# Patient Record
Sex: Male | Born: 1940 | Race: White | Hispanic: No | Marital: Married | Smoking: Former smoker
Health system: Southern US, Community
[De-identification: ages and names within clinical notes are randomized; demographics above are authoritative.]

## PROBLEM LIST (undated history)

## (undated) DIAGNOSIS — N486 Induration penis plastica: Secondary | ICD-10-CM

## (undated) DIAGNOSIS — N529 Male erectile dysfunction, unspecified: Secondary | ICD-10-CM

## (undated) DIAGNOSIS — N201 Calculus of ureter: Secondary | ICD-10-CM

## (undated) DIAGNOSIS — E039 Hypothyroidism, unspecified: Secondary | ICD-10-CM

## (undated) DIAGNOSIS — R911 Solitary pulmonary nodule: Secondary | ICD-10-CM

## (undated) DIAGNOSIS — E785 Hyperlipidemia, unspecified: Secondary | ICD-10-CM

## (undated) DIAGNOSIS — I2510 Atherosclerotic heart disease of native coronary artery without angina pectoris: Secondary | ICD-10-CM

## (undated) DIAGNOSIS — T7840XA Allergy, unspecified, initial encounter: Secondary | ICD-10-CM

## (undated) DIAGNOSIS — I219 Acute myocardial infarction, unspecified: Secondary | ICD-10-CM

## (undated) DIAGNOSIS — R319 Hematuria, unspecified: Secondary | ICD-10-CM

## (undated) DIAGNOSIS — M7660 Achilles tendinitis, unspecified leg: Secondary | ICD-10-CM

## (undated) DIAGNOSIS — I10 Essential (primary) hypertension: Secondary | ICD-10-CM

## (undated) DIAGNOSIS — R222 Localized swelling, mass and lump, trunk: Secondary | ICD-10-CM

## (undated) DIAGNOSIS — M6528 Calcific tendinitis, other site: Secondary | ICD-10-CM

## (undated) DIAGNOSIS — G479 Sleep disorder, unspecified: Secondary | ICD-10-CM

## (undated) DIAGNOSIS — Z87442 Personal history of urinary calculi: Secondary | ICD-10-CM

## (undated) HISTORY — DX: Acute myocardial infarction, unspecified: I21.9

## (undated) HISTORY — DX: Calculus of ureter: N20.1

## (undated) HISTORY — PX: OSTECTOMY CALCANEUS: SUR969

## (undated) HISTORY — DX: Localized swelling, mass and lump, trunk: R22.2

## (undated) HISTORY — DX: Hematuria, unspecified: R31.9

## (undated) HISTORY — DX: Essential (primary) hypertension: I10

## (undated) HISTORY — DX: Hyperlipidemia, unspecified: E78.5

## (undated) HISTORY — DX: Male erectile dysfunction, unspecified: N52.9

## (undated) HISTORY — PX: CORONARY ARTERY BYPASS GRAFT: SHX141

## (undated) HISTORY — DX: Allergy, unspecified, initial encounter: T78.40XA

## (undated) HISTORY — DX: Atherosclerotic heart disease of native coronary artery without angina pectoris: I25.10

## (undated) HISTORY — DX: Solitary pulmonary nodule: R91.1

## (undated) HISTORY — DX: Hypothyroidism, unspecified: E03.9

## (undated) HISTORY — DX: Personal history of urinary calculi: Z87.442

## (undated) HISTORY — DX: Induration penis plastica: N48.6

## (undated) HISTORY — PX: PENILE PROSTHESIS IMPLANT: SHX240

## (undated) HISTORY — PX: COLONOSCOPY: SHX174

---
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
## 1998-06-03 ENCOUNTER — Ambulatory Visit (HOSPITAL_COMMUNITY): Admission: RE | Admit: 1998-06-03 | Discharge: 1998-06-03 | Payer: Self-pay | Admitting: Internal Medicine

## 1998-06-03 ENCOUNTER — Encounter: Payer: Self-pay | Admitting: Internal Medicine

## 1998-07-04 ENCOUNTER — Ambulatory Visit (HOSPITAL_COMMUNITY): Admission: RE | Admit: 1998-07-04 | Discharge: 1998-07-04 | Payer: Self-pay | Admitting: Gastroenterology

## 1998-10-17 ENCOUNTER — Ambulatory Visit (HOSPITAL_COMMUNITY): Admission: RE | Admit: 1998-10-17 | Discharge: 1998-10-17 | Payer: Self-pay | Admitting: Gastroenterology

## 1999-06-08 HISTORY — PX: CARDIAC CATHETERIZATION: SHX172

## 2000-10-27 ENCOUNTER — Encounter: Payer: Self-pay | Admitting: General Surgery

## 2000-10-28 ENCOUNTER — Ambulatory Visit (HOSPITAL_COMMUNITY): Admission: RE | Admit: 2000-10-28 | Discharge: 2000-10-28 | Payer: Self-pay | Admitting: General Surgery

## 2004-06-07 HISTORY — PX: CORONARY ARTERY BYPASS GRAFT: SHX141

## 2010-06-07 DIAGNOSIS — Z87442 Personal history of urinary calculi: Secondary | ICD-10-CM

## 2010-06-07 DIAGNOSIS — R911 Solitary pulmonary nodule: Secondary | ICD-10-CM

## 2010-06-07 HISTORY — DX: Solitary pulmonary nodule: R91.1

## 2010-06-07 HISTORY — DX: Personal history of urinary calculi: Z87.442

## 2011-05-27 ENCOUNTER — Ambulatory Visit: Payer: Self-pay | Admitting: Internal Medicine

## 2011-06-09 LAB — HM DIABETES EYE EXAM

## 2011-07-08 ENCOUNTER — Encounter: Payer: Self-pay | Admitting: Internal Medicine

## 2011-07-08 ENCOUNTER — Ambulatory Visit (INDEPENDENT_AMBULATORY_CARE_PROVIDER_SITE_OTHER): Payer: Medicare Other | Admitting: Internal Medicine

## 2011-07-08 VITALS — BP 154/84 | HR 64 | Temp 98.3°F | Ht 70.0 in | Wt 188.0 lb

## 2011-07-08 DIAGNOSIS — E785 Hyperlipidemia, unspecified: Secondary | ICD-10-CM

## 2011-07-08 DIAGNOSIS — Z125 Encounter for screening for malignant neoplasm of prostate: Secondary | ICD-10-CM

## 2011-07-08 DIAGNOSIS — E039 Hypothyroidism, unspecified: Secondary | ICD-10-CM

## 2011-07-08 DIAGNOSIS — I2510 Atherosclerotic heart disease of native coronary artery without angina pectoris: Secondary | ICD-10-CM

## 2011-07-08 DIAGNOSIS — I10 Essential (primary) hypertension: Secondary | ICD-10-CM | POA: Diagnosis not present

## 2011-07-08 DIAGNOSIS — M6790 Unspecified disorder of synovium and tendon, unspecified site: Secondary | ICD-10-CM

## 2011-07-08 DIAGNOSIS — H579 Unspecified disorder of eye and adnexa: Secondary | ICD-10-CM

## 2011-07-08 DIAGNOSIS — E1139 Type 2 diabetes mellitus with other diabetic ophthalmic complication: Secondary | ICD-10-CM | POA: Diagnosis not present

## 2011-07-08 DIAGNOSIS — E113599 Type 2 diabetes mellitus with proliferative diabetic retinopathy without macular edema, unspecified eye: Secondary | ICD-10-CM

## 2011-07-08 NOTE — Patient Instructions (Signed)
We are changing cholesterol medication to Crestor 20 mg daily.   We will repeat lipids in 6 weeks time.  Check on your formulary for atorvastatin (Lipitor) and rosuvastatin (Crestor)  So we can determine which one to switch you to long term

## 2011-07-08 NOTE — Progress Notes (Signed)
Subjective:    Patient ID: Brett [name], male    DOB: [birth year], 71 y.o.   MRN: [id number]  HPI  [name] is a 71 year old white male with a history of coronary artery disease status post four-vessel bypass in 2006, diabetes mellitus with proliferative retinopathy, depression, and hypothyroidism who presents to establish primary care.  He has chronic bilateral heel pain secondary to bone spurs on both Achilles tendons which prevent him from exercising regularly. He has undergone steroid injections by his podiatrist which helped transiently. His last primary care physician was several months ago and he brings labs from his October 2012 evaluation. And C6 0.9 his last diabetic eye exam was at the [facility] last month. He has no history of neuropathy. He is up to date on colon cancer screening as his last colonoscopy was done in 2009 with some polyps found. He is interested in transferring GI followup to [facility]. He has had a PSA within the last year by a screening fair which was apparently normal. He has no symptoms of enlarged prostate. He has no other joint pains other than his Achilles tendinopathy. He did have a flu vaccine this year and had a Pneumovax at the [place] as well as the shingles vaccine at the [place] within the past year. He notes that after his CABG he had complete resolution of chronic allergic rhinitis. However after review of his medications he has been taking Benadryl every night for insomnia.   Past Medical History  Diagnosis Date  . Hypertension   . Hyperlipidemia   . Diabetes mellitus   . Myocardial infarction   . Allergy     resolved after CABG   No current outpatient prescriptions on file prior to visit.    Review of Systems  Constitutional: Negative for fever, chills, diaphoresis, activity change, appetite change, fatigue and unexpected weight change.  HENT: Negative for hearing loss, ear pain, nosebleeds, congestion, sore throat, facial swelling, rhinorrhea, sneezing,  drooling, mouth sores, trouble swallowing, neck pain, neck stiffness, dental problem, voice change, postnasal drip, sinus pressure, tinnitus and ear discharge.   Eyes: Negative for photophobia, pain, discharge, redness, itching and visual disturbance.  Respiratory: Negative for apnea, cough, choking, chest tightness, shortness of breath, wheezing and stridor.   Cardiovascular: Negative for chest pain, palpitations and leg swelling.  Gastrointestinal: Negative for nausea, vomiting, abdominal pain, diarrhea, constipation, blood in stool, abdominal distention, anal bleeding and rectal pain.  Genitourinary: Negative for dysuria, urgency, frequency, hematuria, flank pain, decreased urine volume, scrotal swelling, difficulty urinating and testicular pain.  Musculoskeletal: Positive for gait problem. Negative for myalgias, back pain, joint swelling and arthralgias.  Skin: Negative for color change, rash and wound.  Neurological: Negative for dizziness, tremors, seizures, syncope, speech difficulty, weakness, light-headedness, numbness and headaches.  Psychiatric/Behavioral: Negative for suicidal ideas, hallucinations, behavioral problems, confusion, sleep disturbance, dysphoric mood, decreased concentration and agitation. The patient is not nervous/anxious.    BP 154/84  Pulse 64  Temp(Src) 98.3 F (36.8 C) (Oral)  Ht 5\' 10"  (1.778 m)  Wt 188 lb (85.276 kg)  BMI 26.98 kg/m2  SpO2 98%     Objective:   Physical Exam  Constitutional: He is oriented to person, place, and time.  HENT:  Head: Normocephalic and atraumatic.  Mouth/Throat: Oropharynx is clear and moist.  Eyes: Conjunctivae and EOM are normal.  Neck: Normal range of motion. Neck supple. No JVD present. No thyromegaly present.  Cardiovascular: Normal rate, regular rhythm and normal heart sounds.   Pulmonary/Chest: Effort normal  and breath sounds normal. He has no wheezes. He has no rales.  Abdominal: Soft. Bowel sounds are normal. He  exhibits no mass. There is no tenderness. There is no rebound.  Musculoskeletal: Normal range of motion. He exhibits no edema.  Neurological: He is alert and oriented to person, place, and time.  Skin: Skin is warm and dry.  Psychiatric: He has a normal mood and affect.      Assessment & Plan:   Diabetes mellitus with proliferative retinopathy His diabetes is well-controlled by prior labs. He has close followup on his retinopathy with the [facility] ophthalmologist. Repeat A1c is due.  Multiple vessel coronary artery disease He is status post four-vessel bypass in 2006 and has been chest pain-free for over one year. He is on a beta blocker and ACE inhibitor a statin and a baby aspirin his fasting lipids will be checked today and medications will be adjusted for goal LDL of less than 70.   Lower extremity tendinopathy He has bilateral Achilles tendon pain due to bone spurs which have caused tendinopathy. This limits his ability to exercise to short walks and swimming. He is not interested in additional podiatry evaluation at this time.  Unspecified hypothyroidism His TSH was within normal limits in October on current levothyroxine dose. No changes today.  Hypertension: He has been asked to have his blood pressure rechecked several times prior to his next visit in 3 months. At that time adjustments will need to his blood pressure medications if his home readings are high as well.  Updated Medication List Outpatient Encounter Prescriptions as of 07/08/2011  Medication Sig Dispense Refill  . Calcium Carbonate-Vitamin D (CALCIUM 600+D PO) Take one by mouth daily      . diphenhydrAMINE (SOMINEX) 25 MG tablet Take 25 mg by mouth at bedtime as needed.      Marland Kitchen ibuprofen (ADVIL,MOTRIN) 800 MG tablet Take one by mouth daily      . levothyroxine (SYNTHROID, LEVOTHROID) 112 MCG tablet Take 112 mcg by mouth daily.      Marland Kitchen lisinopril (PRINIVIL,ZESTRIL) 2.5 MG tablet Take 2.5 mg by mouth daily.      .  metFORMIN (GLUCOPHAGE) 850 MG tablet Take 850 mg by mouth 2 (two) times daily with a meal.      . metoprolol tartrate (LOPRESSOR) 25 MG tablet Take 25 mg by mouth daily.      . Omega-3 Fatty Acids (FISH OIL) 1000 MG CAPS Take one by mouth daily      . simvastatin (ZOCOR) 40 MG tablet Take 40 mg by mouth every evening.      . venlafaxine (EFFEXOR) 75 MG tablet Take 75 mg by mouth daily.

## 2011-07-09 DIAGNOSIS — E119 Type 2 diabetes mellitus without complications: Secondary | ICD-10-CM | POA: Insufficient documentation

## 2011-07-09 DIAGNOSIS — I152 Hypertension secondary to endocrine disorders: Secondary | ICD-10-CM | POA: Insufficient documentation

## 2011-07-09 DIAGNOSIS — E039 Hypothyroidism, unspecified: Secondary | ICD-10-CM | POA: Insufficient documentation

## 2011-07-09 DIAGNOSIS — E11319 Type 2 diabetes mellitus with unspecified diabetic retinopathy without macular edema: Secondary | ICD-10-CM | POA: Insufficient documentation

## 2011-07-09 DIAGNOSIS — M65279 Calcific tendinitis, unspecified ankle and foot: Secondary | ICD-10-CM | POA: Insufficient documentation

## 2011-07-09 NOTE — Assessment & Plan Note (Signed)
He has bilateral Achilles tendon pain due to bone spurs which have caused tendinopathy. This limits his ability to exercise to short walks and swimming. He is not interested in additional podiatry evaluation at this time.

## 2011-07-09 NOTE — Assessment & Plan Note (Signed)
He has been asked to have his blood pressure rechecked several times prior to his next visit in 3 months. At that time adjustments will need to his blood pressure medications if his home readings are high as well.

## 2011-07-09 NOTE — Assessment & Plan Note (Signed)
He is status post four-vessel bypass in 2006 and has been chest pain-free for over one year. He is on a beta blocker and ACE inhibitor a statin and a baby aspirin his fasting lipids will be checked today and medications will be adjusted for goal LDL of less than 70.

## 2011-07-09 NOTE — Assessment & Plan Note (Signed)
His diabetes is well-controlled by prior labs. He has close followup on his retinopathy with the [facility] ophthalmologist. Repeat A1c is due.

## 2011-07-09 NOTE — Assessment & Plan Note (Signed)
His TSH was within normal limits in October on current levothyroxine dose. No changes today.

## 2011-07-27 DIAGNOSIS — M7660 Achilles tendinitis, unspecified leg: Secondary | ICD-10-CM | POA: Diagnosis not present

## 2011-07-27 DIAGNOSIS — M7730 Calcaneal spur, unspecified foot: Secondary | ICD-10-CM | POA: Diagnosis not present

## 2011-08-04 ENCOUNTER — Telehealth: Payer: Self-pay | Admitting: Internal Medicine

## 2011-08-04 NOTE — Telephone Encounter (Signed)
Left message asking patient to return my call.

## 2011-08-04 NOTE — Telephone Encounter (Signed)
[phone] Pt came in today and stated that the crestor samples you gave him gave him a rash and he stopped taking them.  He wanted to know if there is something else he could take [employer]

## 2011-08-05 ENCOUNTER — Other Ambulatory Visit: Payer: Self-pay | Admitting: Internal Medicine

## 2011-08-05 ENCOUNTER — Telehealth: Payer: Self-pay | Admitting: Internal Medicine

## 2011-08-05 MED ORDER — ATORVASTATIN CALCIUM 40 MG PO TABS: 40.0000 mg | ORAL_TABLET | Freq: Every day | ORAL | Status: DC

## 2011-08-05 NOTE — Telephone Encounter (Signed)
Left message asking patient to return my call.

## 2011-08-05 NOTE — Telephone Encounter (Signed)
Patient called and stated the atorvastatin is still over $100 and he does not have insurance.  He wanted to know if there is something else he can try or of he needs to go back to his old regimen of simvastatin.  Please advise.

## 2011-08-05 NOTE — Telephone Encounter (Signed)
Rx has been called in  

## 2011-08-05 NOTE — Telephone Encounter (Signed)
Patient stated Crestor is the only new medication he started and he developed a rash on his arms.  He denied any other symptoms.  He wanted to know if you wanted him to start another cholesterol medication.  Please advise.

## 2011-08-05 NOTE — Telephone Encounter (Signed)
Yes, he was right to stop the crestor.  We can try generic lipitor (atorvastatin 40 mg daily ) instead.  Please call in #[phone] with 2 refills to his pharmacy

## 2011-08-06 NOTE — Telephone Encounter (Signed)
Patient notified to resume simvastatin 40 mg daily.  He does not need a script at this time.  Chart has been updated.

## 2011-08-06 NOTE — Telephone Encounter (Signed)
resume simvastatin 40 mg daily  .  Does he need a new script

## 2011-08-19 ENCOUNTER — Other Ambulatory Visit: Payer: No Typology Code available for payment source

## 2011-10-05 ENCOUNTER — Telehealth: Payer: Self-pay | Admitting: Internal Medicine

## 2011-10-05 ENCOUNTER — Ambulatory Visit (INDEPENDENT_AMBULATORY_CARE_PROVIDER_SITE_OTHER): Payer: Medicare Other | Admitting: Internal Medicine

## 2011-10-05 ENCOUNTER — Encounter: Payer: Self-pay | Admitting: Internal Medicine

## 2011-10-05 VITALS — BP 110/60 | HR 58 | Temp 97.9°F | Resp 16 | Wt 187.5 lb

## 2011-10-05 DIAGNOSIS — I2510 Atherosclerotic heart disease of native coronary artery without angina pectoris: Secondary | ICD-10-CM | POA: Diagnosis not present

## 2011-10-05 DIAGNOSIS — E113599 Type 2 diabetes mellitus with proliferative diabetic retinopathy without macular edema, unspecified eye: Secondary | ICD-10-CM

## 2011-10-05 DIAGNOSIS — E785 Hyperlipidemia, unspecified: Secondary | ICD-10-CM | POA: Diagnosis not present

## 2011-10-05 DIAGNOSIS — IMO0001 Reserved for inherently not codable concepts without codable children: Secondary | ICD-10-CM

## 2011-10-05 DIAGNOSIS — L578 Other skin changes due to chronic exposure to nonionizing radiation: Secondary | ICD-10-CM

## 2011-10-05 DIAGNOSIS — E1139 Type 2 diabetes mellitus with other diabetic ophthalmic complication: Secondary | ICD-10-CM | POA: Diagnosis not present

## 2011-10-05 DIAGNOSIS — I10 Essential (primary) hypertension: Secondary | ICD-10-CM

## 2011-10-05 DIAGNOSIS — E039 Hypothyroidism, unspecified: Secondary | ICD-10-CM | POA: Diagnosis not present

## 2011-10-05 DIAGNOSIS — E119 Type 2 diabetes mellitus without complications: Secondary | ICD-10-CM

## 2011-10-05 LAB — MICROALBUMIN / CREATININE URINE RATIO
Creatinine,U: 220 mg/dL
Microalb Creat Ratio: 0.4 mg/g (ref 0.0–30.0)

## 2011-10-05 MED ORDER — METOPROLOL TARTRATE 25 MG PO TABS: 25.0000 mg | ORAL_TABLET | Freq: Every day | ORAL | Status: DC

## 2011-10-05 NOTE — Patient Instructions (Addendum)
Consider the Low Glycemic Index Diet and 6 smaller meals daily .  This boosts your metabolism and regulates your sugars:   7 AM Low carbohydrate Protein  Shakes (EAS Carb Control  Or Backs ,  Available everywhere,   In  cases at [employer] )  2.5 carbs  (Add or substitute a toasted sandwhich thin w/ peanut butter)  10 AM: Protein bar by Gilliand (snack size,  Chocolate lover's variety at  [employer])    Lunch: sandwich on pita bread or flatbread (Joseph's makes a pita bread and a flat bread , available at [employer] and [employer]; Toufayah makes a low carb flatbread available at [employer] and [employer]) Mission makes a low carb whole wheat tortilla available at [employer] most grocery stores   3 PM:  Mid day :  Another protein bar,  Or a  cheese stick, 1/4 cup of almonds, walnuts, pistachios, pecans, peanuts,  Macadamia nuts  6 PM  Dinner:  "mean and green:"  Meat/chicken/fish, salad, and green veggie : use ranch, vinagrette,  Blue cheese, etc  9 PM snack : Breyer's low carb fudgsicle or  ice cream bar (Carb Smart), or  Weight Watcher's ice cream bar , or another protein shake  I am referring your for vascular evaluatio nof your legs,  And dermatology evaluatio nof your skin  Return in 3 months for your annual Medicare physical

## 2011-10-05 NOTE — Assessment & Plan Note (Signed)
Awaiting records from prior PCP.  Has no local cardiology followup because he prefers to keep his specialists from his prior locale.

## 2011-10-05 NOTE — Telephone Encounter (Signed)
[phone] Pt called needs refill on  Metoprolol 25mg  [facility]

## 2011-10-05 NOTE — Assessment & Plan Note (Signed)
He is due for repeat TSH

## 2011-10-05 NOTE — Telephone Encounter (Signed)
Rx sent to pharmacy   

## 2011-10-05 NOTE — Assessment & Plan Note (Signed)
He is due for repeat a1c and UA.  Goal LDl is 70 , goal a1c < 7.0.  Discussed and recommended  low glycemic index diet as a way of life , to prevent escalating need for additional medications.

## 2011-10-05 NOTE — Assessment & Plan Note (Signed)
Well controlled on current medications.  No changes today. 

## 2011-10-05 NOTE — Progress Notes (Signed)
Patient ID: Brett [name], male   DOB: 01/21/1941, 71 y.o.   MRN: [id number]  Patient Active Problem List  Diagnoses  . Hypertension  . Multiple vessel coronary artery disease  . Diabetes mellitus with proliferative retinopathy  . Lower extremity tendinopathy  . Unspecified hypothyroidism    Subjective:  CC:   Chief Complaint  Patient presents with  . Follow-up    3 month  . Long QT Syndrome    HPI:   Brett Atkinsis a 70 y.o. male who presents Follow upon DM, CAD, hypertension, hyperlipidemia.  His fasting blood sugars have been elevated for the last month with range of 130 to 155.  He is taking his metformin without GI upset.  He is not exercising regularly due to bilateral heel pain which has not responded  to steroid injections by podiatry.  He is not following a low glycemic index diet.  He has no history of weight loss, cough , or dyspnea.  He is concerned that it may be time to repeat a chest CT for follow up on pulmonary nodule vs scar seen on CT done after his 4 vessel CABG.     Past Medical History  Diagnosis Date  . Hypertension   . Hyperlipidemia   . Diabetes mellitus   . Myocardial infarction   . Allergy     resolved after CABG    Past Surgical History  Procedure Date  . Coronary artery bypass graft 2006    4 vessel, after 3 or 4 stents          The following portions of the patient's history were reviewed and updated as appropriate: Allergies, current medications, and problem list.    Review of Systems:   12 Pt  review of systems was negative except those addressed in the HPI,     History   Social History  . Marital Status: Married    Spouse Name: N/A    Number of Children: N/A  . Years of Education: N/A   Occupational History  . Not on file.   Social History Main Topics  . Smoking status: Former Smoker    Types: Cigars    Quit date: 10/04/1981  . Smokeless tobacco: Never Used  . Alcohol Use: Yes  . Drug Use: No  . Sexually Active:  Not on file   Other Topics Concern  . Not on file   Social History Narrative  . No narrative on file    Objective:  BP 110/60  Pulse 58  Temp(Src) 97.9 F (36.6 C) (Oral)  Resp 16  Wt 187 lb 8 oz (85.049 kg)  SpO2 96%  General appearance: alert, cooperative and appears stated age Ears: normal TM's and external ear canals both ears Throat: lips, mucosa, and tongue normal; teeth and gums normal Neck: no adenopathy, no carotid bruit, supple, symmetrical, trachea midline and thyroid not enlarged, symmetric, no tenderness/mass/nodules Back: symmetric, no curvature. ROM normal. No CVA tenderness. Lungs: clear to auscultation bilaterally Heart: regular rate and rhythm, S1, S2 normal, no murmur, click, rub or gallop Abdomen: soft, non-tender; bowel sounds normal; no masses,  no organomegaly Pulses: 2+ and symmetric Skin: Skin color brown/excessive sun damage, freckled,  Texture thin and dry,turgor normal. No rashes or lesions Lymph nodes: Cervical, supraclavicular, and axillary nodes normal.  Assessment and Plan:  Unspecified hypothyroidism He is due for repeat TSH  Multiple vessel coronary artery disease Awaiting records from prior PCP.  Has no local cardiology followup because he prefers to keep his  specialists from his prior locale.   Hypertension Well controlled on current medications.  No changes today.  Diabetes mellitus with proliferative retinopathy He is due for repeat a1c and UA.  Goal LDl is 70 , goal a1c < 7.0.  Discussed and recommended  low glycemic index diet as a way of life , to prevent escalating need for additional medications.     Updated Medication List Outpatient Encounter Prescriptions as of 10/05/2011  Medication Sig Dispense Refill  . Calcium Carbonate-Vitamin D (CALCIUM 600+D PO) Take one by mouth daily      . diphenhydrAMINE (SOMINEX) 25 MG tablet Take 25 mg by mouth at bedtime as needed.      Marland Kitchen ibuprofen (ADVIL,MOTRIN) 800 MG tablet Take one by  mouth daily      . levothyroxine (SYNTHROID, LEVOTHROID) 112 MCG tablet Take 112 mcg by mouth daily.      Marland Kitchen lisinopril (PRINIVIL,ZESTRIL) 2.5 MG tablet Take 2.5 mg by mouth daily.      . metFORMIN (GLUCOPHAGE) 850 MG tablet Take 850 mg by mouth 2 (two) times daily with a meal.      . Omega-3 Fatty Acids (FISH OIL) 1000 MG CAPS Take one by mouth daily      . simvastatin (ZOCOR) 40 MG tablet Take 40 mg by mouth every evening.      Marland Kitchen DISCONTD: metoprolol tartrate (LOPRESSOR) 25 MG tablet Take 25 mg by mouth daily.      Marland Kitchen venlafaxine (EFFEXOR) 75 MG tablet Take 75 mg by mouth daily.         Orders Placed This Encounter  Procedures  . Microalbumin / creatinine urine ratio  . Ambulatory referral to Dermatology  . Ambulatory referral to Vascular Surgery  . HM COLONOSCOPY    No Follow-up on file.

## 2011-10-06 LAB — COMPLETE METABOLIC PANEL WITH GFR
CO2: 27 mEq/L (ref 19–32)
Calcium: 9.4 mg/dL (ref 8.4–10.5)
Chloride: 102 mEq/L (ref 96–112)
Creat: 0.86 mg/dL (ref 0.50–1.35)
GFR, Est African American: >89 mL/min
GFR, Est Non African American: 89 mL/min
Glucose, Bld: 121 mg/dL — ABNORMAL HIGH (ref 70–99)
Sodium: 139 mEq/L (ref 135–145)
Total Bilirubin: 0.5 mg/dL (ref 0.3–1.2)
Total Protein: 7.3 g/dL (ref 6.0–8.3)

## 2011-10-07 LAB — LIPID PANEL W/O CHOL/HDL RATIO
Cholesterol, Total: 201 mg/dL — ABNORMAL HIGH (ref 100–199)
Triglycerides: 137 mg/dL (ref 0–149)

## 2011-10-11 ENCOUNTER — Telehealth: Payer: Self-pay | Admitting: Internal Medicine

## 2011-10-11 NOTE — Telephone Encounter (Signed)
Patient wants a copy of his blood work mailed to him.

## 2011-10-11 NOTE — Telephone Encounter (Signed)
Labs mailed to patient.

## 2011-10-18 DIAGNOSIS — I70219 Atherosclerosis of native arteries of extremities with intermittent claudication, unspecified extremity: Secondary | ICD-10-CM | POA: Diagnosis not present

## 2011-10-18 DIAGNOSIS — M79609 Pain in unspecified limb: Secondary | ICD-10-CM | POA: Diagnosis not present

## 2011-10-18 DIAGNOSIS — M1990 Unspecified osteoarthritis, unspecified site: Secondary | ICD-10-CM | POA: Diagnosis not present

## 2011-10-18 DIAGNOSIS — I2510 Atherosclerotic heart disease of native coronary artery without angina pectoris: Secondary | ICD-10-CM | POA: Diagnosis not present

## 2011-10-25 ENCOUNTER — Telehealth: Payer: Self-pay | Admitting: Internal Medicine

## 2011-10-25 NOTE — Telephone Encounter (Signed)
Rx sent to pharmacy   

## 2011-10-25 NOTE — Telephone Encounter (Signed)
[phone] Pt called needing refill on simvastiatiin 80mg  [facility]   [phone] Pt is completely out

## 2011-12-02 DIAGNOSIS — I70219 Atherosclerosis of native arteries of extremities with intermittent claudication, unspecified extremity: Secondary | ICD-10-CM | POA: Diagnosis not present

## 2011-12-02 DIAGNOSIS — I700 Atherosclerosis of aorta: Secondary | ICD-10-CM | POA: Diagnosis not present

## 2011-12-02 DIAGNOSIS — I10 Essential (primary) hypertension: Secondary | ICD-10-CM | POA: Diagnosis not present

## 2011-12-07 LAB — HM DIABETES FOOT EXAM: HM Diabetic Foot Exam: NORMAL

## 2011-12-13 DIAGNOSIS — D485 Neoplasm of uncertain behavior of skin: Secondary | ICD-10-CM | POA: Diagnosis not present

## 2011-12-13 DIAGNOSIS — D2370 Other benign neoplasm of skin of unspecified lower limb, including hip: Secondary | ICD-10-CM | POA: Diagnosis not present

## 2011-12-27 ENCOUNTER — Telehealth: Payer: Self-pay | Admitting: Internal Medicine

## 2011-12-27 NOTE — Telephone Encounter (Signed)
HIs recent ultrasound did not suggest a blockage in the leg arteries. Will

## 2011-12-27 NOTE — Telephone Encounter (Signed)
Patient notified

## 2012-01-03 ENCOUNTER — Other Ambulatory Visit: Payer: Self-pay | Admitting: Internal Medicine

## 2012-01-04 ENCOUNTER — Encounter: Payer: Self-pay | Admitting: Internal Medicine

## 2012-01-04 ENCOUNTER — Ambulatory Visit (INDEPENDENT_AMBULATORY_CARE_PROVIDER_SITE_OTHER): Payer: Medicare Other | Admitting: Internal Medicine

## 2012-01-04 VITALS — BP 120/62 | HR 58 | Temp 98.3°F | Resp 16 | Wt 183.5 lb

## 2012-01-04 DIAGNOSIS — H579 Unspecified disorder of eye and adnexa: Secondary | ICD-10-CM | POA: Diagnosis not present

## 2012-01-04 DIAGNOSIS — Z0000 Encounter for general adult medical examination without abnormal findings: Secondary | ICD-10-CM | POA: Diagnosis not present

## 2012-01-04 DIAGNOSIS — E785 Hyperlipidemia, unspecified: Secondary | ICD-10-CM

## 2012-01-04 DIAGNOSIS — E11359 Type 2 diabetes mellitus with proliferative diabetic retinopathy without macular edema: Secondary | ICD-10-CM | POA: Diagnosis not present

## 2012-01-04 DIAGNOSIS — Z125 Encounter for screening for malignant neoplasm of prostate: Secondary | ICD-10-CM

## 2012-01-04 DIAGNOSIS — E119 Type 2 diabetes mellitus without complications: Secondary | ICD-10-CM | POA: Diagnosis not present

## 2012-01-04 DIAGNOSIS — E1139 Type 2 diabetes mellitus with other diabetic ophthalmic complication: Secondary | ICD-10-CM

## 2012-01-04 DIAGNOSIS — E039 Hypothyroidism, unspecified: Secondary | ICD-10-CM

## 2012-01-04 DIAGNOSIS — R911 Solitary pulmonary nodule: Secondary | ICD-10-CM

## 2012-01-04 DIAGNOSIS — E113599 Type 2 diabetes mellitus with proliferative diabetic retinopathy without macular edema, unspecified eye: Secondary | ICD-10-CM

## 2012-01-04 LAB — COMPREHENSIVE METABOLIC PANEL
ALT: 17 U/L (ref 0–53)
AST: 15 U/L (ref 0–37)
Albumin: 4.3 g/dL (ref 3.5–5.2)
BUN: 25 mg/dL — ABNORMAL HIGH (ref 6–23)
CO2: 28 mEq/L (ref 19–32)
Calcium: 9.4 mg/dL (ref 8.4–10.5)
Chloride: 105 mEq/L (ref 96–112)
GFR: 79.72 mL/min (ref >60.00)
Potassium: 4.4 mEq/L (ref 3.5–5.1)

## 2012-01-04 LAB — HEMOGLOBIN A1C: Hgb A1c MFr Bld: 6.6 % — ABNORMAL HIGH (ref 4.6–6.5)

## 2012-01-04 MED ORDER — VENLAFAXINE HCL 75 MG PO TABS: 75.0000 mg | ORAL_TABLET | Freq: Two times a day (BID) | ORAL | Status: DC

## 2012-01-04 NOTE — Progress Notes (Signed)
Patient ID: Brett [name], male   DOB: 1941/04/22, 70 y.o.   MRN: [id number] The patient is here for annual Medicare wellness examination and management of other chronic and acute problems. Doing well.  Had a skin biopsy left calf after last visit which was reportedly benign,  [place]..  Needs to have follow up chest CT for incidental  pulmonary nodule left side found during body scan for hematuria in 2012.  At that time several small nonobstructing renal calculi were found,  And he had a normal cystoscopy.  Last passage of stone was years ago in 2003.   The risk factors are reflected in the social history.  The roster of all physicians providing medical care to patient - is listed in the Snapshot section of the chart.  Activities of daily living:  The patient is 100% independent in all ADLs: dressing, toileting, feeding as well as independent mobility  Home safety : The patient has smoke detectors in the home. They wear seatbelts.  There are no firearms at home. There is no violence in the home.   There is no risks for hepatitis, STDs or HIV. There is no   history of blood transfusion. They have no travel history to infectious disease endemic areas of the world.  The patient has seen their dentist in the last six month. They have seen their eye doctor in the last year. They admit to slight hearing difficulty with regard to whispered voices and some television programs.  They have deferred audiologic testing in the last year.  They do not  have excessive sun exposure. Discussed the need for sun protection: hats, long sleeves and use of sunscreen if there is significant sun exposure.   Diet: the importance of a healthy diet is discussed. They do have a healthy diet.  The benefits of regular aerobic exercise were discussed. She walks 4 times per week ,  20 minutes.   Depression screen: there are no signs or vegative symptoms of depression- irritability, change in appetite, anhedonia,  sadness/tearfullness.  Cognitive assessment: the patient manages all their financial and personal affairs and is actively engaged. They could relate day,date,year and events; recalled 2/3 objects at 3 minutes; performed clock-face test normally.  The following portions of the patient's history were reviewed and updated as appropriate: allergies, current medications, past family history, past medical history,  past surgical history, past social history  and problem list.  Visual acuity was not assessed per patient preference since she has regular follow up with her ophthalmologist. Hearing and body mass index were assessed and reviewed.   During the course of the visit the patient was educated and counseled about appropriate screening and preventive services including : fall prevention , diabetes screening, nutrition counseling, colorectal cancer screening, and recommended immunizations.    Physical exam BP 120/62  Pulse 58  Temp 98.3 F (36.8 C) (Oral)  Resp 16  Wt 183 lb 8 oz (83.235 kg)  SpO2 95%  General Appearance:    Alert, cooperative, no distress, appears stated age  Head:    Normocephalic, without obvious abnormality, atraumatic  Eyes:    PERRL, conjunctiva/corneas clear, EOM's intact, fundi    benign, both eyes       Ears:    Normal TM's and external ear canals, both ears  Nose:   Nares normal, septum midline, mucosa normal, no drainage   or sinus tenderness  Throat:   Lips, mucosa, and tongue normal; teeth and gums normal  Neck:   Supple,  symmetrical, trachea midline, no adenopathy;       thyroid:  No enlargement/tenderness/nodules; no carotid   bruit or JVD  Back:     Symmetric, no curvature, ROM normal, no CVA tenderness  Lungs:     Clear to auscultation bilaterally, respirations unlabored  Chest wall:    No tenderness or deformity  Heart:    Regular rate and rhythm, S1 and S2 normal, no murmur, rub   or gallop  Abdomen:     Soft, non-tender, bowel sounds active all  four quadrants,    no masses, no organomegaly  Genitalia:    Normal male without lesion, discharge or tenderness  Rectal:    Normal tone, normal prostate, no masses or tenderness;   No stool in vault so hemoccult not done   Extremities:   Extremities normal, atraumatic, no cyanosis or edema  Pulses:   2+ and symmetric all extremities  Skin:   Skin color, texture, turgor normal, no rashes or lesions  Lymph nodes:   Cervical, supraclavicular, and axillary nodes normal  Neurologic:   CNII-XII intact. Normal strength, sensation and reflexes      Throughout  Diabetes mellitus with proliferative retinopathy a1c drawn today ,  Up to date on eye exams.  Unspecified hypothyroidism Checking TSH twice annually  Pulmonary nodule seen on imaging study repeat CT ordered for follow up. Will refer to [facility] if present   Updated Medication List Outpatient Encounter Prescriptions as of 01/04/2012  Medication Sig Dispense Refill  . Calcium Carbonate-Vitamin D (CALCIUM 600+D PO) Take one by mouth daily      . diphenhydrAMINE (SOMINEX) 25 MG tablet Take 25 mg by mouth at bedtime as needed.      Brett Kitchen ibuprofen (ADVIL,MOTRIN) 800 MG tablet Take one by mouth daily      . levothyroxine (SYNTHROID, LEVOTHROID) 112 MCG tablet Take 112 mcg by mouth daily.      Brett Kitchen lisinopril (PRINIVIL,ZESTRIL) 2.5 MG tablet Take 2.5 mg by mouth daily.      . metFORMIN (GLUCOPHAGE) 850 MG tablet Take 850 mg by mouth 2 (two) times daily with a meal.      . metoprolol tartrate (LOPRESSOR) 25 MG tablet TAKE 1 TABLET BY MOUTH EVERY DAY . HOLD FOR PRESSURE LESS THAN 50  30 tablet  4  . Omega-3 Fatty Acids (FISH OIL) 1000 MG CAPS Take one by mouth daily      . simvastatin (ZOCOR) 80 MG tablet Take 80 mg by mouth. Take 1/2 tablet daily by mouth.      . venlafaxine (EFFEXOR) 75 MG tablet Take 1 tablet (75 mg total) by mouth 2 (two) times daily.  60 tablet  3  . DISCONTD: venlafaxine (EFFEXOR) 75 MG tablet Take 75 mg by mouth daily.

## 2012-01-04 NOTE — Assessment & Plan Note (Addendum)
a1c drawn today ,  Up to date on eye exams.

## 2012-01-05 ENCOUNTER — Encounter: Payer: Self-pay | Admitting: Internal Medicine

## 2012-01-05 DIAGNOSIS — R911 Solitary pulmonary nodule: Secondary | ICD-10-CM | POA: Insufficient documentation

## 2012-01-05 DIAGNOSIS — Z87442 Personal history of urinary calculi: Secondary | ICD-10-CM | POA: Insufficient documentation

## 2012-01-05 NOTE — Assessment & Plan Note (Signed)
Checking TSH twice annually

## 2012-01-05 NOTE — Assessment & Plan Note (Signed)
repeat CT ordered for follow up. Will refer to [facility] if present

## 2012-01-06 LAB — LIPID PANEL
Cholesterol: 191 mg/dL (ref 0–200)
LDL Cholesterol: 118 mg/dL — ABNORMAL HIGH (ref 0–99)
Total CHOL/HDL Ratio: 3
Triglycerides: 80 mg/dL (ref 0.0–149.0)
VLDL: 16 mg/dL (ref 0.0–40.0)

## 2012-01-07 ENCOUNTER — Ambulatory Visit: Payer: Self-pay | Admitting: Internal Medicine

## 2012-01-07 DIAGNOSIS — R911 Solitary pulmonary nodule: Secondary | ICD-10-CM | POA: Diagnosis not present

## 2012-01-07 DIAGNOSIS — R918 Other nonspecific abnormal finding of lung field: Secondary | ICD-10-CM | POA: Diagnosis not present

## 2012-01-09 ENCOUNTER — Telehealth: Payer: Self-pay | Admitting: Internal Medicine

## 2012-01-09 NOTE — Telephone Encounter (Signed)
His CT scan showed no change compared to prior CT . Recommend follow up in one year.

## 2012-01-10 NOTE — Telephone Encounter (Signed)
pATIENT NOTIFIED.

## 2012-01-19 ENCOUNTER — Encounter: Payer: Self-pay | Admitting: Internal Medicine

## 2012-01-30 ENCOUNTER — Other Ambulatory Visit: Payer: Self-pay | Admitting: Internal Medicine

## 2012-02-15 DIAGNOSIS — Z23 Encounter for immunization: Secondary | ICD-10-CM | POA: Diagnosis not present

## 2012-02-26 ENCOUNTER — Other Ambulatory Visit: Payer: Self-pay | Admitting: Internal Medicine

## 2012-03-25 ENCOUNTER — Other Ambulatory Visit: Payer: Self-pay | Admitting: Internal Medicine

## 2012-03-25 NOTE — Telephone Encounter (Signed)
This medication is supposed to be taken twice daily, , it does not last for 24 hours,  i have changed rx for #60 tablets.

## 2012-03-27 ENCOUNTER — Other Ambulatory Visit: Payer: Self-pay | Admitting: Internal Medicine

## 2012-03-27 ENCOUNTER — Other Ambulatory Visit: Payer: Self-pay

## 2012-03-27 NOTE — Telephone Encounter (Signed)
Metoprolol tartrate sent in electronic

## 2012-05-08 ENCOUNTER — Ambulatory Visit (INDEPENDENT_AMBULATORY_CARE_PROVIDER_SITE_OTHER): Payer: Medicare Other | Admitting: Internal Medicine

## 2012-05-08 ENCOUNTER — Encounter: Payer: Self-pay | Admitting: Internal Medicine

## 2012-05-08 VITALS — BP 126/64 | HR 49 | Temp 97.4°F | Resp 12 | Ht 71.0 in | Wt 184.8 lb

## 2012-05-08 DIAGNOSIS — R911 Solitary pulmonary nodule: Secondary | ICD-10-CM

## 2012-05-08 DIAGNOSIS — E11359 Type 2 diabetes mellitus with proliferative diabetic retinopathy without macular edema: Secondary | ICD-10-CM

## 2012-05-08 DIAGNOSIS — E119 Type 2 diabetes mellitus without complications: Secondary | ICD-10-CM

## 2012-05-08 DIAGNOSIS — E113599 Type 2 diabetes mellitus with proliferative diabetic retinopathy without macular edema, unspecified eye: Secondary | ICD-10-CM

## 2012-05-08 DIAGNOSIS — Z1331 Encounter for screening for depression: Secondary | ICD-10-CM

## 2012-05-08 DIAGNOSIS — E1139 Type 2 diabetes mellitus with other diabetic ophthalmic complication: Secondary | ICD-10-CM

## 2012-05-08 DIAGNOSIS — E039 Hypothyroidism, unspecified: Secondary | ICD-10-CM

## 2012-05-08 DIAGNOSIS — I10 Essential (primary) hypertension: Secondary | ICD-10-CM | POA: Diagnosis not present

## 2012-05-08 DIAGNOSIS — E785 Hyperlipidemia, unspecified: Secondary | ICD-10-CM

## 2012-05-08 LAB — COMPREHENSIVE METABOLIC PANEL
BUN: 21 mg/dL (ref 6–23)
CO2: 27 mEq/L (ref 19–32)
Calcium: 9 mg/dL (ref 8.4–10.5)
Chloride: 102 mEq/L (ref 96–112)
Creatinine, Ser: 0.9 mg/dL (ref 0.4–1.5)
GFR: 90.05 mL/min (ref >60.00)
Glucose, Bld: 108 mg/dL — ABNORMAL HIGH (ref 70–99)

## 2012-05-08 LAB — LDL CHOLESTEROL, DIRECT: Direct LDL: 104.7 mg/dL

## 2012-05-08 LAB — LIPID PANEL
Cholesterol: 164 mg/dL (ref 0–200)
Triglycerides: 106 mg/dL (ref 0.0–149.0)

## 2012-05-08 LAB — HEMOGLOBIN A1C: Hgb A1c MFr Bld: 6.6 % — ABNORMAL HIGH (ref 4.6–6.5)

## 2012-05-08 MED ORDER — ALPRAZOLAM 0.5 MG PO TABS: 0.5000 mg | ORAL_TABLET | Freq: Every evening | ORAL | Status: DC | PRN

## 2012-05-08 MED ORDER — ASPIRIN 81 MG PO TABS: 81.0000 mg | ORAL_TABLET | Freq: Every day | ORAL | Status: DC

## 2012-05-08 NOTE — Assessment & Plan Note (Signed)
LD is 94 on maximal dose of simvastatin 40 mg daily,  prior statin intolerances noted. Trial of crestor.

## 2012-05-08 NOTE — Progress Notes (Signed)
Patient ID: Brett [name], male   DOB: [birth year], 71 y.o.   MRN: [id number]  Patient Active Problem List  Diagnosis  . Hypertension  . Multiple vessel coronary artery disease  . Diabetes mellitus with proliferative retinopathy  . Lower extremity tendinopathy  . Unspecified hypothyroidism  . Other and unspecified hyperlipidemia  . Routine general medical examination at a health care facility  . Pulmonary nodule seen on imaging study  . H/O renal calculi    Subjective:  CC:   Chief Complaint  Patient presents with  . Follow-up    HPI:   Brett Atkinsis a 71 y.o. male who presents Follow up on diabetes mellitus, hypertension and anxiety.  He hs been tolerating his medications and had no  low blood sugars fastings have been 120 typically. Having some anxiety and irritability secondary to caregiver fatigue due to his wife's progressive dementia .  He is using medications including alprazolam and effexor As directed.    Past Medical History  Diagnosis Date  . Hypertension   . Hyperlipidemia   . Diabetes mellitus   . Myocardial infarction   . Allergy     resolved after CABG  . Pulmonary nodule seen on imaging study 2012  . H/O renal calculi 2012    Past Surgical History  Procedure Date  . Coronary artery bypass graft 2006    4 vessel, after 3 or 4 stents          The following portions of the patient's history were reviewed and updated as appropriate: Allergies, current medications, and problem list.    Review of Systems:   12 Pt  review of systems was negative except those addressed in the HPI,     History   Social History  . Marital Status: Married    Spouse Name: N/A    Number of Children: N/A  . Years of Education: N/A   Occupational History  . Not on file.   Social History Main Topics  . Smoking status: Former Smoker    Types: Cigars    Quit date: 10/04/1981  . Smokeless tobacco: Never Used  . Alcohol Use: Yes  . Drug Use: No  . Sexually Active:  Not on file   Other Topics Concern  . Not on file   Social History Narrative  . No narrative on file    Objective:  BP 126/64  Pulse 49  Temp 97.4 F (36.3 C) (Oral)  Resp 12  Ht 5\' 11"  (1.803 m)  Wt 184 lb 12 oz (83.802 kg)  BMI 25.77 kg/m2  SpO2 97%  General appearance: alert, cooperative and appears stated age Ears: normal TM's and external ear canals both ears Throat: lips, mucosa, and tongue normal; teeth and gums normal Neck: no adenopathy, no carotid bruit, supple, symmetrical, trachea midline and thyroid not enlarged, symmetric, no tenderness/mass/nodules Back: symmetric, no curvature. ROM normal. No CVA tenderness. Lungs: clear to auscultation bilaterally Heart: regular rate and rhythm, S1, S2 normal, no murmur, click, rub or gallop Abdomen: soft, non-tender; bowel sounds normal; no masses,  no organomegaly Pulses: 2+ and symmetric Skin: Skin color, texture, turgor normal. No rashes or lesions Lymph nodes: Cervical, supraclavicular, and axillary nodes normal.  Assessment and Plan:  Hypertension Well controlled on current medications.  No changes today.  Diabetes mellitus with proliferative retinopathy Managed at the [place].  Up to date on exam done in Jan 2013.  No proteinuria,  LDl is 94, A1c is 6.6 and he is on appropriate medications. including  ace inhibitor, asa and statin ,  Other and unspecified hyperlipidemia LD is 94 on maximal dose of simvastatin 40 mg daily,  prior statin intolerances noted. Trial of crestor.  Pulmonary nodule seen on imaging study Stable by repeat CT .   2 yr follow up recommended    Updated Medication List Outpatient Encounter Prescriptions as of 05/08/2012  Medication Sig Dispense Refill  . Calcium Carbonate-Vitamin D (CALCIUM 600+D PO) Take one by mouth daily      . diphenhydrAMINE (SOMINEX) 25 MG tablet Take 25 mg by mouth at bedtime as needed.      Marland Kitchen ibuprofen (ADVIL,MOTRIN) 800 MG tablet Take one by mouth daily      .  levothyroxine (SYNTHROID, LEVOTHROID) 112 MCG tablet TAKE 1 TABLET BY MOUTH EVERY DAY  90 tablet  1  . lisinopril (PRINIVIL,ZESTRIL) 2.5 MG tablet TAKE 1 TABLET BY MOUTH EVERY DAY  90 tablet  1  . metFORMIN (GLUCOPHAGE) 850 MG tablet TAKE 1 TABLET BY MOUTH TWICE A DAY  180 tablet  1  . metoprolol tartrate (LOPRESSOR) 25 MG tablet Take 1 tablet (25 mg total) by mouth 2 (two) times daily.  60 tablet  4  . Omega-3 Fatty Acids (FISH OIL) 1000 MG CAPS Take one by mouth daily      . simvastatin (ZOCOR) 80 MG tablet Take 80 mg by mouth. Take 1/2 tablet daily by mouth.      . venlafaxine (EFFEXOR) 75 MG tablet Take 1 tablet (75 mg total) by mouth 2 (two) times daily.  60 tablet  3  . ALPRAZolam (XANAX) 0.5 MG tablet Take 1 tablet (0.5 mg total) by mouth at bedtime as needed for sleep.  30 tablet  3  . aspirin 81 MG tablet Take 1 tablet (81 mg total) by mouth daily.  30 tablet  11     Orders Placed This Encounter  Procedures  . Lipid panel  . Comprehensive metabolic panel  . TSH  . Hemoglobin A1c  . HM DIABETES EYE EXAM  . HM DIABETES FOOT EXAM    No Follow-up on file.

## 2012-05-08 NOTE — Assessment & Plan Note (Signed)
Stable by repeat CT .   2 yr follow up recommended

## 2012-05-08 NOTE — Assessment & Plan Note (Addendum)
Managed at the [place].  Up to date on exam done in Jan 2013.  No proteinuria,  LDl is 94, A1c is 6.6 and he is on appropriate medications. including ace inhibitor, asa and statin ,

## 2012-05-08 NOTE — Assessment & Plan Note (Signed)
Well controlled on current medications.  No changes today. 

## 2012-05-16 ENCOUNTER — Telehealth: Payer: Self-pay | Admitting: Internal Medicine

## 2012-05-16 NOTE — Telephone Encounter (Signed)
Wanting test results posted to My Chart.

## 2012-05-16 NOTE — Telephone Encounter (Signed)
His chart shows that all of his labs from Dec 2 were released to My chart and read by him.  i have no way of re releasing them.  He will have to call the MyChart  help desk to figure out why he is not seeing them. .  Offer to mail him a copy of everything if he doesn't have them

## 2012-05-17 NOTE — Telephone Encounter (Signed)
Left messageon patient vm with instructions as directed, also mailed copy of labs to patient.

## 2012-06-09 ENCOUNTER — Ambulatory Visit: Payer: Medicare Other | Admitting: Internal Medicine

## 2012-07-25 ENCOUNTER — Telehealth: Payer: Self-pay | Admitting: Internal Medicine

## 2012-07-25 NOTE — Telephone Encounter (Signed)
Pt is needing test strip refilled Pricision extra. [facility] uses [facility] [address].

## 2012-07-26 MED ORDER — GLUCOSE BLOOD VI STRP: ORAL_STRIP | Status: DC

## 2012-07-26 NOTE — Telephone Encounter (Signed)
Strips filled today.

## 2012-07-31 ENCOUNTER — Telehealth: Payer: Self-pay | Admitting: Internal Medicine

## 2012-07-31 ENCOUNTER — Other Ambulatory Visit: Payer: Self-pay | Admitting: Internal Medicine

## 2012-07-31 ENCOUNTER — Telehealth: Payer: Self-pay | Admitting: *Deleted

## 2012-07-31 MED ORDER — GLUCOSE BLOOD VI STRP: ORAL_STRIP | Status: DC

## 2012-07-31 NOTE — Telephone Encounter (Signed)
Pt calling back saying that [facility] [address] never received the rx for Precision xtra (Glucost) test strips.

## 2012-07-31 NOTE — Telephone Encounter (Signed)
Ok to refill, with changes made  Fax printed rx to pharmacy once signed   

## 2012-07-31 NOTE — Telephone Encounter (Signed)
Pt came in to office today stating that [facility] [address] told him for his test strips it needs to say Diagnosis code What type of strips and # of strips, Medicare will not pay for these if the above information is not on the rx.   Pt is competely out of these and has been trying to get for 2 weeks Please advise pt when these have been called in.  Pt also brought in box The name is Precision xtra blood glucose test strips

## 2012-07-31 NOTE — Telephone Encounter (Signed)
Med filled. Called pt could not reach.

## 2012-07-31 NOTE — Telephone Encounter (Signed)
Rx faxed to pharmacy with diagnosis code written on script.

## 2012-07-31 NOTE — Telephone Encounter (Signed)
Pharmacist called and left a message that new script needs to be sent in electronically or faxed for test strips with diagnosis code and directions. Pharmacist states that this can not be called in because Medicare requires a e-scribe or faxed copy of diabetic supplies. Please advise specific directions for test strips.

## 2012-08-07 ENCOUNTER — Encounter: Payer: Self-pay | Admitting: Internal Medicine

## 2012-08-07 ENCOUNTER — Ambulatory Visit (INDEPENDENT_AMBULATORY_CARE_PROVIDER_SITE_OTHER): Payer: Medicare Other | Admitting: Internal Medicine

## 2012-08-07 VITALS — BP 118/70 | HR 60 | Temp 97.8°F | Ht 71.0 in | Wt 180.5 lb

## 2012-08-07 DIAGNOSIS — E11359 Type 2 diabetes mellitus with proliferative diabetic retinopathy without macular edema: Secondary | ICD-10-CM

## 2012-08-07 DIAGNOSIS — F341 Dysthymic disorder: Secondary | ICD-10-CM

## 2012-08-07 DIAGNOSIS — I10 Essential (primary) hypertension: Secondary | ICD-10-CM

## 2012-08-07 DIAGNOSIS — E785 Hyperlipidemia, unspecified: Secondary | ICD-10-CM

## 2012-08-07 DIAGNOSIS — R911 Solitary pulmonary nodule: Secondary | ICD-10-CM

## 2012-08-07 DIAGNOSIS — E113599 Type 2 diabetes mellitus with proliferative diabetic retinopathy without macular edema, unspecified eye: Secondary | ICD-10-CM

## 2012-08-07 DIAGNOSIS — E119 Type 2 diabetes mellitus without complications: Secondary | ICD-10-CM

## 2012-08-07 DIAGNOSIS — F418 Other specified anxiety disorders: Secondary | ICD-10-CM

## 2012-08-07 DIAGNOSIS — I2510 Atherosclerotic heart disease of native coronary artery without angina pectoris: Secondary | ICD-10-CM

## 2012-08-07 DIAGNOSIS — E1139 Type 2 diabetes mellitus with other diabetic ophthalmic complication: Secondary | ICD-10-CM

## 2012-08-07 LAB — COMPREHENSIVE METABOLIC PANEL
CO2: 27 mEq/L (ref 19–32)
Calcium: 9 mg/dL (ref 8.4–10.5)
Chloride: 104 mEq/L (ref 96–112)
Creatinine, Ser: 1 mg/dL (ref 0.4–1.5)
GFR: 81.48 mL/min (ref >60.00)
Glucose, Bld: 112 mg/dL — ABNORMAL HIGH (ref 70–99)
Total Bilirubin: 0.7 mg/dL (ref 0.3–1.2)

## 2012-08-07 LAB — HEMOGLOBIN A1C: Hgb A1c MFr Bld: 6.5 % (ref 4.6–6.5)

## 2012-08-07 LAB — HM DIABETES FOOT EXAM: HM Diabetic Foot Exam: NORMAL

## 2012-08-07 LAB — MICROALBUMIN / CREATININE URINE RATIO
Microalb Creat Ratio: 0.6 mg/g (ref 0.0–30.0)
Microalb, Ur: 1.1 mg/dL (ref 0.0–1.9)

## 2012-08-07 LAB — LIPID PANEL
HDL: 52 mg/dL (ref >39.00)
Triglycerides: 97 mg/dL (ref 0.0–149.0)

## 2012-08-07 MED ORDER — ALPRAZOLAM 0.5 MG PO TABS: 0.5000 mg | ORAL_TABLET | Freq: Every evening | ORAL | Status: DC | PRN

## 2012-08-07 MED ORDER — ATORVASTATIN CALCIUM 40 MG PO TABS: 40.0000 mg | ORAL_TABLET | Freq: Every day | ORAL | Status: DC

## 2012-08-07 NOTE — Assessment & Plan Note (Addendum)
Asymptomatic. New ACC guidelines recommend that  patients aged 70 or be put on high intensity statin therapy for LDL between 70-189 and 10 yr risk of CAD > 7.5% ;  Since he has known CAD and LDL is < 100 on simvastatin 80 mg will bes witching from simvastatin to atorvastatin 40 mg daily and repeat LDL in 3 months .  Needs old records and baseline .

## 2012-08-07 NOTE — Patient Instructions (Addendum)
I am switching you from simvastatin to atorvastatin with your next refill per [facility] guidelines on treatment of hyperlipidemia.  You can finish your current supply of simvastatin   Your foot exam was normal  Get your annual eye exam done  Look for the low carb pasta called [employer]

## 2012-08-07 NOTE — Assessment & Plan Note (Signed)
New ACC guidelines recommend starting patients aged 70 or higher with clinical ASCVD on moderate intensity therapy and those aged < 75 on high intensity therapy .   This was discussed today and we will switch him from generic Zocor to to generic Lipitor 40 mg with his next refill of statin

## 2012-08-07 NOTE — Assessment & Plan Note (Addendum)
He has been historically well-controlled on current medications.  hemoglobin A1c has been consistently less than 7.0 . He is not up-to-date on eye exams and has been advised to hae this done.  his foot exam is normal. l we'll repeat his urine microalbumin to creatinine ratio this visit. He is on the appropriate medications.

## 2012-08-07 NOTE — Assessment & Plan Note (Signed)
Well controlled on current regimen. Renal function stable, no changes today. 

## 2012-08-07 NOTE — Progress Notes (Signed)
Patient ID: Brett [name], male   DOB: 11-Jun-1940, 72 y.o.   MRN: [id number]  Patient Active Problem List  Diagnosis  . Hypertension  . Multiple vessel coronary artery disease  . Diabetes mellitus with proliferative retinopathy  . Lower extremity tendinopathy  . Unspecified hypothyroidism  . Other and unspecified hyperlipidemia  . Routine general medical examination at a health care facility  . Pulmonary nodule seen on imaging study  . H/O renal calculi    Subjective:  CC:   Chief Complaint  Patient presents with  . Follow-up    diabetes    HPI:   Sabastien Atkinsis a 72 y.o. male who presents 3 month diabetes follow up.  He reports regular indulgences in sausage biscuits as his main deviation from a diabetic diet. He denies any recent elevated blood sugars or hypoglycemic events. He is tolerating his medications and taking them regularly. He denies any recent chest pain,  shortness of breath,  weight gain,  Neuropathy,  or fluid retention.     Past Medical History  Diagnosis Date  . Hypertension   . Hyperlipidemia   . Diabetes mellitus   . Myocardial infarction   . Allergy     resolved after CABG  . Pulmonary nodule seen on imaging study 2012  . H/O renal calculi 2012    Past Surgical History  Procedure Laterality Date  . Coronary artery bypass graft  2006    4 vessel, after 3 or 4 stents        The following portions of the patient's history were reviewed and updated as appropriate: Allergies, current medications, and problem list.    Review of Systems:   Patient denies headache, fevers, malaise, unintentional weight loss, skin rash, eye pain, sinus congestion and sinus pain, sore throat, dysphagia,  hemoptysis , cough, dyspnea, wheezing, chest pain, palpitations, orthopnea, edema, abdominal pain, nausea, melena, diarrhea, constipation, flank pain, dysuria, hematuria, urinary  Frequency, nocturia, numbness, tingling, seizures,  Focal weakness, Loss of consciousness,   Tremor, insomnia, depression, anxiety, and suicidal ideation.       History   Social History  . Marital Status: Married    Spouse Name: N/A    Number of Children: N/A  . Years of Education: N/A   Occupational History  . Not on file.   Social History Main Topics  . Smoking status: Former Smoker    Types: Cigars    Quit date: 10/04/1981  . Smokeless tobacco: Never Used  . Alcohol Use: Yes  . Drug Use: No  . Sexually Active: Not on file   Other Topics Concern  . Not on file   Social History Narrative  . No narrative on file    Objective:  BP 118/70  Pulse 60  Temp(Src) 97.8 F (36.6 C) (Oral)  Ht 5\' 11"  (1.803 m)  Wt 180 lb 8 oz (81.874 kg)  BMI 25.19 kg/m2  SpO2 97%  General appearance: alert, cooperative and appears stated age Ears: normal TM's and external ear canals both ears Throat: lips, mucosa, and tongue normal; teeth and gums normal Neck: no adenopathy, no carotid bruit, supple, symmetrical, trachea midline and thyroid not enlarged, symmetric, no tenderness/mass/nodules Back: symmetric, no curvature. ROM normal. No CVA tenderness. Lungs: clear to auscultation bilaterally Heart: regular rate and rhythm, S1, S2 normal, no murmur, click, rub or gallop Abdomen: soft, non-tender; bowel sounds normal; no masses,  no organomegaly Pulses: 2+ and symmetric Skin: Skin color, texture, turgor normal. No rashes or lesions Lymph nodes: Cervical,  supraclavicular, and axillary nodes normal. Foot exam:  Nails are well trimmed, not thickened.  No callouses,  Sensation intact to microfilament in 12/12 locations bilaterally.   Assessment and Plan:  Diabetes mellitus with proliferative retinopathy He has been historically well-controlled on current medications.  hemoglobin A1c has been consistently less than 7.0 . He is not up-to-date on eye exams and has been advised to hae this done.  his foot exam is normal. l we'll repeat his urine microalbumin to creatinine ratio this  visit. He is on the appropriate medications.  Multiple vessel coronary artery disease Asymptomatic. New ACC guidelines recommend that  patients aged 81 or be put on high intensity statin therapy for LDL between 70-189 and 10 yr risk of CAD > 7.5% ;  Since he has known CAD and LDL is < 100 on simvastatin 80 mg will bes witching from simvastatin to atorvastatin 40 mg daily and repeat LDL in 3 months .  Needs old records and baseline .   Other and unspecified hyperlipidemia New ACC guidelines recommend starting patients aged 49 or higher with clinical ASCVD on moderate intensity therapy and those aged < 75 on high intensity therapy .   This was discussed today and we will switch him from generic Zocor to to generic Lipitor 40 mg with his next refill of statin  Hypertension Well controlled on current regimen. Renal function stable, no changes today.  Pulmonary nodule seen on imaging study Chest CT with contrast done August 2013 was compared to August 2012 CT and there were no changes in the densities which appear to be consistent with rounded atelectasis. Because there were no outside films for comparison repeat chest CT for one more year was recommended.   Depression with anxiety  Symptoms are stable with current use .of Effexor. Life stressors include his wife's progressive dementia    Updated Medication List Outpatient Encounter Prescriptions as of 08/07/2012  Medication Sig Dispense Refill  . ALPRAZolam (XANAX) 0.5 MG tablet Take 1 tablet (0.5 mg total) by mouth at bedtime as needed for sleep.  30 tablet  3  . aspirin 81 MG tablet Take 1 tablet (81 mg total) by mouth daily.  30 tablet  11  . diphenhydrAMINE (SOMINEX) 25 MG tablet Take 25 mg by mouth at bedtime as needed.      Marland Kitchen glucose blood (PRECISION XTRA TEST STRIPS) test strip Test blood sugars once daily .  Use one test strip each time blood sugar is checked.  100 each  6  . glucose blood test strip Please use one test strip each time  sugar is to be tested.   Dx. 250.00  100 each  12  . ibuprofen (ADVIL,MOTRIN) 800 MG tablet Take one by mouth daily      . levothyroxine (SYNTHROID, LEVOTHROID) 112 MCG tablet TAKE 1 TABLET BY MOUTH EVERY DAY  90 tablet  1  . lisinopril (PRINIVIL,ZESTRIL) 2.5 MG tablet TAKE 1 TABLET BY MOUTH EVERY DAY  90 tablet  1  . metFORMIN (GLUCOPHAGE) 850 MG tablet TAKE 1 TABLET BY MOUTH TWICE A DAY  180 tablet  1  . metoprolol tartrate (LOPRESSOR) 25 MG tablet Take 25 mg by mouth daily.      . Omega-3 Fatty Acids (FISH OIL) 1000 MG CAPS Take one by mouth three times a week      . simvastatin (ZOCOR) 80 MG tablet Take 80 mg by mouth. Take 1/2 tablet daily by mouth.      . venlafaxine [facility])  75 MG tablet Take 75 mg by mouth daily.      . [DISCONTINUED] ALPRAZolam (XANAX) 0.5 MG tablet Take 1 tablet (0.5 mg total) by mouth at bedtime as needed for sleep.  30 tablet  3  . [DISCONTINUED] metoprolol tartrate (LOPRESSOR) 25 MG tablet Take 1 tablet (25 mg total) by mouth 2 (two) times daily.  60 tablet  4  . [DISCONTINUED] venlafaxine (EFFEXOR) 75 MG tablet Take 1 tablet (75 mg total) by mouth 2 (two) times daily.  60 tablet  3  . atorvastatin (LIPITOR) 40 MG tablet Take 1 tablet (40 mg total) by mouth daily.  90 tablet  3  . Calcium Carbonate-Vitamin D (CALCIUM 600+D PO) Take one by mouth daily      . [DISCONTINUED] atorvastatin (LIPITOR) 40 MG tablet Take 1 tablet (40 mg total) by mouth daily.  90 tablet  3   No facility-administered encounter medications on file as of 08/07/2012.

## 2012-08-07 NOTE — Assessment & Plan Note (Signed)
Chest CT with contrast done August 2013 was compared to August 2012 CT and there were no changes in the densities which appear to be consistent with rounded atelectasis. Because there were no outside films for comparison repeat chest CT for one more year was recommended.

## 2012-08-07 NOTE — Assessment & Plan Note (Signed)
Symptoms are stable with current use .of Effexor. Life stressors include his wife's progressive dementia

## 2012-08-08 ENCOUNTER — Encounter: Payer: Self-pay | Admitting: Internal Medicine

## 2012-09-08 ENCOUNTER — Other Ambulatory Visit: Payer: Self-pay | Admitting: Internal Medicine

## 2012-09-15 ENCOUNTER — Other Ambulatory Visit: Payer: Self-pay | Admitting: Internal Medicine

## 2012-09-21 ENCOUNTER — Telehealth: Payer: Self-pay | Admitting: *Deleted

## 2012-09-21 NOTE — Telephone Encounter (Signed)
Fax  Returned to [place] with required information and patient advised. In reference to Lipitor change from Simvastatin.

## 2012-10-16 ENCOUNTER — Other Ambulatory Visit: Payer: Self-pay | Admitting: Internal Medicine

## 2012-11-17 ENCOUNTER — Telehealth: Payer: Self-pay | Admitting: *Deleted

## 2012-11-17 ENCOUNTER — Ambulatory Visit (INDEPENDENT_AMBULATORY_CARE_PROVIDER_SITE_OTHER): Payer: Medicare Other | Admitting: Internal Medicine

## 2012-11-17 ENCOUNTER — Encounter: Payer: Self-pay | Admitting: Internal Medicine

## 2012-11-17 VITALS — BP 144/70 | HR 59 | Temp 97.8°F | Resp 14 | Wt 179.5 lb

## 2012-11-17 DIAGNOSIS — E11359 Type 2 diabetes mellitus with proliferative diabetic retinopathy without macular edema: Secondary | ICD-10-CM

## 2012-11-17 DIAGNOSIS — R350 Frequency of micturition: Secondary | ICD-10-CM | POA: Insufficient documentation

## 2012-11-17 DIAGNOSIS — E1139 Type 2 diabetes mellitus with other diabetic ophthalmic complication: Secondary | ICD-10-CM | POA: Diagnosis not present

## 2012-11-17 DIAGNOSIS — I10 Essential (primary) hypertension: Secondary | ICD-10-CM

## 2012-11-17 DIAGNOSIS — R197 Diarrhea, unspecified: Secondary | ICD-10-CM | POA: Diagnosis not present

## 2012-11-17 LAB — CBC WITH DIFFERENTIAL/PLATELET
Basophils Absolute: 0 10*3/uL (ref 0.0–0.1)
Basophils Relative: 0.3 % (ref 0.0–3.0)
Eosinophils Absolute: 0.3 10*3/uL (ref 0.0–0.7)
Eosinophils Relative: 4.4 % (ref 0.0–5.0)
HCT: 41 % (ref 39.0–52.0)
Hemoglobin: 13.5 g/dL (ref 13.0–17.0)
Lymphocytes Relative: 31.2 % (ref 12.0–46.0)
Lymphs Abs: 2.1 10*3/uL (ref 0.7–4.0)
MCHC: 32.9 g/dL (ref 30.0–36.0)
MCV: 90.4 fl (ref 78.0–100.0)
Monocytes Absolute: 0.7 10*3/uL (ref 0.1–1.0)
Monocytes Relative: 11.1 % (ref 3.0–12.0)
Neutro Abs: 3.6 10*3/uL (ref 1.4–7.7)
Neutrophils Relative %: 53 % (ref 43.0–77.0)
Platelets: 264 10*3/uL (ref 150.0–400.0)
RBC: 4.54 Mil/uL (ref 4.22–5.81)
RDW: 14.4 % (ref 11.5–14.6)
WBC: 6.8 10*3/uL (ref 4.5–10.5)

## 2012-11-17 LAB — COMPREHENSIVE METABOLIC PANEL
ALT: 24 U/L (ref 0–53)
AST: 20 U/L (ref 0–37)
Chloride: 104 mEq/L (ref 96–112)
Creatinine, Ser: 1 mg/dL (ref 0.4–1.5)
Sodium: 143 mEq/L (ref 135–145)
Total Bilirubin: 0.6 mg/dL (ref 0.3–1.2)
Total Protein: 7.7 g/dL (ref 6.0–8.3)

## 2012-11-17 LAB — POCT URINALYSIS DIPSTICK
Bilirubin, UA: NEGATIVE
Glucose, UA: 250
Nitrite, UA: NEGATIVE
Spec Grav, UA: 1.015
Urobilinogen, UA: 0.2

## 2012-11-17 LAB — MICROALBUMIN / CREATININE URINE RATIO
Creatinine,U: 63.4 mg/dL
Microalb Creat Ratio: 3.3 mg/g (ref 0.0–30.0)
Microalb, Ur: 2.1 mg/dL — ABNORMAL HIGH (ref 0.0–1.9)

## 2012-11-17 LAB — HEMOGLOBIN A1C: Hgb A1c MFr Bld: 6.8 % — ABNORMAL HIGH (ref 4.6–6.5)

## 2012-11-17 MED ORDER — METRONIDAZOLE 500 MG PO TABS: 500.0000 mg | ORAL_TABLET | Freq: Three times a day (TID) | ORAL | Status: DC

## 2012-11-17 NOTE — Patient Instructions (Addendum)
Have Brett [name] make an appt so I can get back in the loop about her pulmonary nodule  Do NOT start the antibiotic until you have collected the stool samples   Suspend the metformin until you have returned to normal stool pattern  DO NOT DRINK ALCOHOL until  48 hours after your last metronidazole dose

## 2012-11-17 NOTE — Telephone Encounter (Signed)
No, thanks 

## 2012-11-17 NOTE — Telephone Encounter (Signed)
Would you like a urine culture?  

## 2012-11-17 NOTE — Progress Notes (Signed)
Patient ID: Brett [name], male   DOB: [birth year], 72 y.o.   MRN: [id number]  Patient Active Problem List   Diagnosis Date Noted  . Diarrhea 11/17/2012  . Urinary frequency 11/17/2012  . Depression with anxiety 08/07/2012  . Pulmonary nodule seen on imaging study   . H/O renal calculi   . Other and unspecified hyperlipidemia 01/04/2012  . Routine general medical examination at a health care facility 01/04/2012  . Multiple vessel coronary artery disease 07/09/2011  . Diabetes mellitus with proliferative retinopathy 07/09/2011  . Lower extremity tendinopathy 07/09/2011  . Unspecified hypothyroidism 07/09/2011  . Hypertension 07/08/2011    Subjective:  CC:   Chief Complaint  Patient presents with  . Acute Visit    loose stools watery in consisitencey with gas and bloating.    HPI:   Brett Atkinsis a 72 y.o. male who presents Watery diarrhea on a daily basis for the past 5 weeks . Went to [place] for a week prior to onset of symptoms and does recall swimming in the ocean but not public pools or lakes.  Ate in restaurants and homes. Marland KitchenHas been taking metformin for DM and stopped it temporarily  And the diarrhea may have improved.  Finally recalls recent treatment with antibiotics for pharyngitis by the [facility] the week prior to developing the diarrhea .  Has altered his diet by avoiding gluten thinking it was gluten intolerance. No fevers, abdominal pain, hematochezia..  Also noting frequent urination , without burning or suprapubic pain.     Past Medical History  Diagnosis Date  . Hypertension   . Hyperlipidemia   . Diabetes mellitus   . Myocardial infarction   . Allergy     resolved after CABG  . Pulmonary nodule seen on imaging study 2012  . H/O renal calculi 2012    Past Surgical History  Procedure Laterality Date  . Coronary artery bypass graft  2006    4 vessel, after 3 or 4 stents        The following portions of the patient's history were reviewed and updated as  appropriate: Allergies, current medications, and problem list.    Review of Systems:   12 Pt  review of systems was negative except those addressed in the HPI,     History   Social History  . Marital Status: Married    Spouse Name: N/A    Number of Children: N/A  . Years of Education: N/A   Occupational History  . Not on file.   Social History Main Topics  . Smoking status: Former Smoker    Types: Cigars    Quit date: 10/04/1981  . Smokeless tobacco: Never Used  . Alcohol Use: Yes  . Drug Use: No  . Sexually Active: Not on file   Other Topics Concern  . Not on file   Social History Narrative  . No narrative on file    Objective:  BP 144/70  Pulse 59  Temp(Src) 97.8 F (36.6 C) (Oral)  Resp 14  Wt 179 lb 8 oz (81.421 kg)  BMI 25.05 kg/m2  SpO2 98%  General appearance: alert, cooperative and appears stated age Ears: normal TM's and external ear canals both ears Throat: lips, mucosa, and tongue normal; teeth and gums normal Neck: no adenopathy, no carotid bruit, supple, symmetrical, trachea midline and thyroid not enlarged, symmetric, no tenderness/mass/nodules Back: symmetric, no curvature. ROM normal. No CVA tenderness. Lungs: clear to auscultation bilaterally Heart: regular rate and rhythm, S1, S2 normal, no  murmur, click, rub or gallop Abdomen: soft, non-tender; bowel sounds normal; no masses,  no organomegaly Pulses: 2+ and symmetric Skin: Skin color, texture, turgor normal. No rashes or lesions Lymph nodes: Cervical, supraclavicular, and axillary nodes normal.  Assessment and Plan:  Diarrhea Suspecting AAD or  c dificile colits given prrior use of antibiotics .  Stool cultures and c dif ag ordered,  Start flagyl AFTER collecting stool.  Stop the metfromin temporarily. Maintain hydration by drinking small amounts of clear fluids frequently, then soft diet, and then advance diet as tolerated. May use OTC Imodium if desired for any diarrhea.  Call if  symptoms worsen, high fever, severe weakness or fainting, increased abdominal pain, blood in stool or vomit, or failure to improve in 2-3 days.   Urinary frequency Secondary to glucosuria, bu today's UA .  DM is well controlled, may be secndnary to acute illness.   Diabetes mellitus with proliferative retinopathy Well-controlled on current medications.  hemoglobin A1c has been consistently less than 7.0 . He is up-to-date on eye exams and his foot exam is normal. l we'll repeat his urine microalbumin to creatinine ratio at next visit. He is on the appropriate medications.  Hypertension Well controlled on current regimen. Renal function stable, no changes today.   Updated Medication List Outpatient Encounter Prescriptions as of 11/17/2012  Medication Sig Dispense Refill  . ALPRAZolam (XANAX) 0.5 MG tablet Take 1 tablet (0.5 mg total) by mouth at bedtime as needed for sleep.  30 tablet  3  . aspirin 81 MG tablet Take 1 tablet (81 mg total) by mouth daily.  30 tablet  11  . atorvastatin (LIPITOR) 40 MG tablet Take 1 tablet (40 mg total) by mouth daily.  90 tablet  3  . diphenhydrAMINE (SOMINEX) 25 MG tablet Take 25 mg by mouth at bedtime as needed.      Marland Kitchen glucose blood (PRECISION XTRA TEST STRIPS) test strip Test blood sugars once daily .  Use one test strip each time blood sugar is checked.  100 each  6  . glucose blood test strip Please use one test strip each time sugar is to be tested.   Dx. 250.00  100 each  12  . ibuprofen (ADVIL,MOTRIN) 800 MG tablet Take one by mouth daily      . levothyroxine (SYNTHROID, LEVOTHROID) 112 MCG tablet TAKE 1 TABLET BY MOUTH EVERY DAY  90 tablet  1  . lisinopril (PRINIVIL,ZESTRIL) 2.5 MG tablet TAKE 1 TABLET BY MOUTH EVERY DAY  90 tablet  1  . metFORMIN (GLUCOPHAGE) 850 MG tablet TAKE 1 TABLET BY MOUTH TWICE A DAY  180 tablet  1  . metoprolol tartrate (LOPRESSOR) 25 MG tablet Take 25 mg by mouth daily.      . metoprolol tartrate (LOPRESSOR) 25 MG tablet  TAKE 1 TABLET (25 MG TOTAL) BY MOUTH 2 (TWO) TIMES DAILY.  60 tablet  4  . venlafaxine (EFFEXOR) 75 MG tablet Take 75 mg by mouth daily.      . Calcium Carbonate-Vitamin D (CALCIUM 600+D PO) Take one by mouth daily      . metroNIDAZOLE (FLAGYL) 500 MG tablet Take 1 tablet (500 mg total) by mouth 3 (three) times daily.  21 tablet  0  . Omega-3 Fatty Acids (FISH OIL) 1000 MG CAPS Take one by mouth three times a week      . [DISCONTINUED] simvastatin (ZOCOR) 80 MG tablet Take 80 mg by mouth. Take 1/2 tablet daily by mouth.  No facility-administered encounter medications on file as of 11/17/2012.     Orders Placed This Encounter  Procedures  . Clostridium difficile toxin  . Ova and parasite screen  . Stool culture  . Micro Review  . Hemoglobin A1c  . Microalbumin / creatinine urine ratio  . CBC with Differential  . Comprehensive metabolic panel  . Sedimentation rate  . Magnesium  . Hemoglobin A1c  . Comprehensive metabolic panel  . Sedimentation rate  . Magnesium  . Miscellaneous test  . POCT Urinalysis Dipstick    No Follow-up on file.

## 2012-11-18 ENCOUNTER — Encounter: Payer: Self-pay | Admitting: Internal Medicine

## 2012-11-19 NOTE — Assessment & Plan Note (Signed)
Well-controlled on current medications.  hemoglobin A1c has been consistently less than 7.0 . He is up-to-date on eye exams and his foot exam is normal. l we'll repeat his urine microalbumin to creatinine ratio at next visit. He is on the appropriate medications. 

## 2012-11-19 NOTE — Assessment & Plan Note (Signed)
Secondary to glucosuria, bu today's UA .  DM is well controlled, may be secndnary to acute illness.

## 2012-11-19 NOTE — Assessment & Plan Note (Signed)
Well controlled on current regimen. Renal function stable, no changes today. 

## 2012-11-19 NOTE — Assessment & Plan Note (Addendum)
Suspecting AAD or  c dificile colits given prrior use of antibiotics .  Stool cultures and c dif ag ordered,  Start flagyl AFTER collecting stool.  Stop the metfromin temporarily. Maintain hydration by drinking small amounts of clear fluids frequently, then soft diet, and then advance diet as tolerated. May use OTC Imodium if desired for any diarrhea.  Call if symptoms worsen, high fever, severe weakness or fainting, increased abdominal pain, blood in stool or vomit, or failure to improve in 2-3 days.

## 2012-11-21 ENCOUNTER — Encounter: Payer: Self-pay | Admitting: Internal Medicine

## 2012-11-21 LAB — CLOSTRIDIUM DIFFICILE EIA: CDIFTX: NEGATIVE

## 2012-11-21 LAB — STOOL CULTURE

## 2012-11-23 ENCOUNTER — Ambulatory Visit (INDEPENDENT_AMBULATORY_CARE_PROVIDER_SITE_OTHER): Payer: Medicare Other | Admitting: Internal Medicine

## 2012-11-23 VITALS — BP 148/80 | HR 61 | Temp 98.4°F | Resp 14 | Wt 178.5 lb

## 2012-11-23 DIAGNOSIS — F418 Other specified anxiety disorders: Secondary | ICD-10-CM

## 2012-11-23 DIAGNOSIS — E1139 Type 2 diabetes mellitus with other diabetic ophthalmic complication: Secondary | ICD-10-CM

## 2012-11-23 DIAGNOSIS — E785 Hyperlipidemia, unspecified: Secondary | ICD-10-CM

## 2012-11-23 DIAGNOSIS — R350 Frequency of micturition: Secondary | ICD-10-CM

## 2012-11-23 DIAGNOSIS — I10 Essential (primary) hypertension: Secondary | ICD-10-CM

## 2012-11-23 DIAGNOSIS — F341 Dysthymic disorder: Secondary | ICD-10-CM | POA: Diagnosis not present

## 2012-11-23 DIAGNOSIS — E11359 Type 2 diabetes mellitus with proliferative diabetic retinopathy without macular edema: Secondary | ICD-10-CM

## 2012-11-23 MED ORDER — GLIPIZIDE 5 MG PO TABS: ORAL_TABLET | ORAL | Status: DC

## 2012-11-23 NOTE — Patient Instructions (Addendum)
Stop the metformin  Start glipizide 1/2 tablet (2.5 mg ) before dinner.  May increase to full tablet after one week fi sugars too high in morning ( > 120)   If you have a low blood sugar,  Stop it.    I will have Brett [name] call you about the memory care facilities for [facility].

## 2012-11-23 NOTE — Progress Notes (Signed)
Patient ID: Brett [name], male   DOB: [birth year], 72 y.o.   MRN: [id number]    Patient Active Problem List   Diagnosis Date Noted  . Urinary frequency 11/17/2012  . Depression with anxiety 08/07/2012  . Pulmonary nodule seen on imaging study   . H/O renal calculi   . Other and unspecified hyperlipidemia 01/04/2012  . Routine general medical examination at a health care facility 01/04/2012  . Multiple vessel coronary artery disease 07/09/2011  . Diabetes mellitus with proliferative retinopathy 07/09/2011  . Lower extremity tendinopathy 07/09/2011  . Unspecified hypothyroidism 07/09/2011  . Hypertension 07/08/2011    Subjective:  CC:   Chief Complaint  Patient presents with  . Follow-up    1 week    HPI:   Brett Atkinsis a 72 y.o. male who presents For one week follow up on watery diarrhea present for 5 weeks and urinary frequency  at time of initial evaluation   His loose stools have resolved,  With suspension of Metformin and empiric abx therapy with flagyl, which he is finishing today.  All stool studies were negative for infectious causes.    DM:  A1c reviewed with patient and is < 7.0 Without metformin his fasting sugars have been as high as 140. He is on no other medications for DM mamangement and often skips lunch. Eats a healthy diet but does not avoid starches habitually.     Past Medical History  Diagnosis Date  . Hypertension   . Hyperlipidemia   . Diabetes mellitus   . Myocardial infarction   . Allergy     resolved after CABG  . Pulmonary nodule seen on imaging study 2012  . H/O renal calculi 2012    Past Surgical History  Procedure Laterality Date  . Coronary artery bypass graft  2006    4 vessel, after 3 or 4 stents        The following portions of the patient's history were reviewed and updated as appropriate: Allergies, current medications, and problem list.    Review of Systems:   Patient denies headache, fevers, malaise, unintentional weight  loss, skin rash, eye pain, sinus congestion and sinus pain, sore throat, dysphagia,  hemoptysis , cough, dyspnea, wheezing, chest pain, palpitations, orthopnea, edema, abdominal pain, nausea, melena, diarrhea, constipation, flank pain, dysuria, hematuria, urinary  Frequency, nocturia, numbness, tingling, seizures,  Focal weakness, Loss of consciousness,  Tremor, insomnia, and suicidal ideation.       History   Social History  . Marital Status: Married    Spouse Name: N/A    Number of Children: N/A  . Years of Education: N/A   Occupational History  . Not on file.   Social History Main Topics  . Smoking status: Former Smoker    Types: Cigars    Quit date: 10/04/1981  . Smokeless tobacco: Never Used  . Alcohol Use: Yes  . Drug Use: No  . Sexually Active: Not on file   Other Topics Concern  . Not on file   Social History Narrative  . No narrative on file    Objective:  BP 148/80  Pulse 61  Temp(Src) 98.4 F (36.9 C) (Oral)  Resp 14  Wt 178 lb 8 oz (80.967 kg)  BMI 24.91 kg/m2  SpO2 98%  General appearance: alert, cooperative and appears stated age Ears: normal TM's and external ear canals both ears Throat: lips, mucosa, and tongue normal; teeth and gums normal Neck: no adenopathy, no carotid bruit, supple, symmetrical, trachea midline and  thyroid not enlarged, symmetric, no tenderness/mass/nodules Back: symmetric, no curvature. ROM normal. No CVA tenderness. Lungs: clear to auscultation bilaterally Heart: regular rate and rhythm, S1, S2 normal, no murmur, click, rub or gallop Abdomen: soft, non-tender; bowel sounds normal; no masses,  no organomegaly Pulses: 2+ and symmetric Skin: Skin color, texture, turgor normal. No rashes or lesions Lymph nodes: Cervical, supraclavicular, and axillary nodes normal.  Assessment and Plan:  Depression with anxiety Aggravated by wife's progressive dementia.  He no longer feels she is safe to live independently with him due to her  confusion.  He is requesting my opinion on several memory care facilities and assistance with placement.  Spent 10 minutes reviewing the facilities with him and offered home evaluation by [facility] to assess patient's wife and aid in placement.   Diabetes mellitus with proliferative retinopathy Well-controlled on current medications.  hemoglobin A1c has been consistently less than 7.0 but he was having multiple daily loose stools with use of metformin that did not improve with time . He is up-to-date on eye exams and his foot exam is normal.  Regarding his other conditions, hHe is on the appropriate medications. Adding glipizide 2.5 mg before dinner only, given his frequent avoidance of lunchtime meal.   Hypertension Elevated today due to patient's anxiety over recent episode of wife's confusion. .  Reviewed list of meds, patient is not taking OTC meds that could be causing,. It.  Have asked patient to recheck bp at home a minimum of 5 times over the next 4 weeks and call readings to office for adjustment of medications.    Other and unspecified hyperlipidemia LDL and triglycerides are at goal on current Lipitor dose .  He has no side effects and liver enzymes are normal. No changes today   Urinary frequency No signs of infection by recent UA but some glucosuria noted.  Prostate exam was normal at last check.    Updated Medication List Outpatient Encounter Prescriptions as of 11/23/2012  Medication Sig Dispense Refill  . ALPRAZolam (XANAX) 0.5 MG tablet Take 1 tablet (0.5 mg total) by mouth at bedtime as needed for sleep.  30 tablet  3  . aspirin 81 MG tablet Take 1 tablet (81 mg total) by mouth daily.  30 tablet  11  . atorvastatin (LIPITOR) 40 MG tablet Take 1 tablet (40 mg total) by mouth daily.  90 tablet  3  . Calcium Carbonate-Vitamin D (CALCIUM 600+D PO) Take one by mouth daily      . diphenhydrAMINE (SOMINEX) 25 MG tablet Take 25 mg by mouth at bedtime as needed.      Marland Kitchen glucose blood  (PRECISION XTRA TEST STRIPS) test strip Test blood sugars once daily .  Use one test strip each time blood sugar is checked.  100 each  6  . glucose blood test strip Please use one test strip each time sugar is to be tested.   Dx. 250.00  100 each  12  . ibuprofen (ADVIL,MOTRIN) 800 MG tablet Take one by mouth daily      . levothyroxine (SYNTHROID, LEVOTHROID) 112 MCG tablet TAKE 1 TABLET BY MOUTH EVERY DAY  90 tablet  1  . lisinopril (PRINIVIL,ZESTRIL) 2.5 MG tablet TAKE 1 TABLET BY MOUTH EVERY DAY  90 tablet  1  . metoprolol tartrate (LOPRESSOR) 25 MG tablet Take 25 mg by mouth daily.      . Omega-3 Fatty Acids (FISH OIL) 1000 MG CAPS Take one by mouth three times a week      .  venlafaxine (EFFEXOR) 75 MG tablet Take 75 mg by mouth daily.      . [DISCONTINUED] metFORMIN (GLUCOPHAGE) 850 MG tablet TAKE 1 TABLET BY MOUTH TWICE A DAY  180 tablet  1  . [DISCONTINUED] metoprolol tartrate (LOPRESSOR) 25 MG tablet TAKE 1 TABLET (25 MG TOTAL) BY MOUTH 2 (TWO) TIMES DAILY.  60 tablet  4  . [DISCONTINUED] metroNIDAZOLE (FLAGYL) 500 MG tablet Take 1 tablet (500 mg total) by mouth 3 (three) times daily.  21 tablet  0  . glipiZIDE (GLUCOTROL) 5 MG tablet 1/2 tablet Once daily before dinner.  30 tablet  3   No facility-administered encounter medications on file as of 11/23/2012.     No orders of the defined types were placed in this encounter.    No Follow-up on file.

## 2012-11-26 ENCOUNTER — Encounter: Payer: Self-pay | Admitting: Internal Medicine

## 2012-11-26 NOTE — Assessment & Plan Note (Signed)
Well-controlled on current medications.  hemoglobin A1c has been consistently less than 7.0 but he was having multiple daily loose stools with use of metformin that did not improve with time . He is up-to-date on eye exams and his foot exam is normal.  Regarding his other conditions, hHe is on the appropriate medications. Adding glipizide 2.5 mg before dinner only, given his frequent avoidance of lunchtime meal.

## 2012-11-26 NOTE — Assessment & Plan Note (Signed)
Elevated today due to patient's anxiety over recent episode of wife's confusion. .  Reviewed list of meds, patient is not taking OTC meds that could be causing,. It.  Have asked patient to recheck bp at home a minimum of 5 times over the next 4 weeks and call readings to office for adjustment of medications.

## 2012-11-26 NOTE — Assessment & Plan Note (Signed)
No signs of infection by recent UA but some glucosuria noted.  Prostate exam was normal at last check.

## 2012-11-26 NOTE — Assessment & Plan Note (Signed)
Aggravated by wife's progressive dementia.  He no longer feels she is safe to live independently with him due to her confusion.  He is requesting my opinion on several memory care facilities and assistance with placement.  Spent 10 minutes reviewing the facilities with him and offered home evaluation by [facility] to assess patient's wife and aid in placement.

## 2012-11-26 NOTE — Assessment & Plan Note (Signed)
LDL and triglycerides are at goal on current Lipitor dose .  He has no side effects and liver enzymes are normal. No changes today

## 2012-11-29 ENCOUNTER — Other Ambulatory Visit: Payer: Self-pay | Admitting: Internal Medicine

## 2012-11-29 NOTE — Telephone Encounter (Signed)
Please advise as to change last OV note reads.Diabetes mellitus with proliferative retinopathy  Well-controlled on current medications. hemoglobin A1c has been consistently less than 7.0 but he was having multiple daily loose stools with use of metformin that did not improve with time . He is up-to-date on eye exams and his foot exam is normal. Regarding his other conditions, hHe is on the appropriate medications. Adding glipizide 2.5 mg before dinner only, given his frequent avoidance of lunchtime meal.

## 2012-11-29 NOTE — Telephone Encounter (Signed)
Pt called stating that dr Darrick Huntsman wanted him to take his glipizide 5mg   1/2 for 1 week and then take 1 tablet a day after the 1st week.  Pt need another rx stating for him to take 1 tablet daily  For 90 days. The drug store only gave him enough to take 1/2 tablet daily not whole tablet

## 2012-11-30 MED ORDER — GLIPIZIDE 5 MG PO TABS: 5.0000 mg | ORAL_TABLET | Freq: Two times a day (BID) | ORAL | Status: DC

## 2012-11-30 NOTE — Telephone Encounter (Signed)
rx sent for short acting glipizide .  5 mg twice daily before meals.  Have him start with 1/2 tablet twice daily and increase to full tabet if blod sugars remain elevated above 130

## 2012-11-30 NOTE — Telephone Encounter (Signed)
Patient notified as instructed. 

## 2013-01-25 ENCOUNTER — Other Ambulatory Visit: Payer: Self-pay | Admitting: Internal Medicine

## 2013-02-07 ENCOUNTER — Other Ambulatory Visit: Payer: Self-pay | Admitting: Internal Medicine

## 2013-02-08 NOTE — Telephone Encounter (Signed)
Okay to refill? 

## 2013-02-14 ENCOUNTER — Other Ambulatory Visit: Payer: Self-pay | Admitting: Internal Medicine

## 2013-02-19 ENCOUNTER — Telehealth: Payer: Self-pay | Admitting: Internal Medicine

## 2013-02-19 NOTE — Telephone Encounter (Signed)
In red folder. 

## 2013-02-19 NOTE — Telephone Encounter (Signed)
Pt dropped off [name] [employer] [facility] medical clearance from  Pt would like this faxed asap In box

## 2013-03-20 ENCOUNTER — Telehealth: Payer: Self-pay | Admitting: Internal Medicine

## 2013-03-20 NOTE — Telephone Encounter (Signed)
Pt dropped off forms to be filled out for volunteering. I put them in Dr. Melina Schools inbox up front.

## 2013-03-21 NOTE — Telephone Encounter (Signed)
Form given to Dr. Darrick Huntsman to be completed

## 2013-03-23 NOTE — Telephone Encounter (Signed)
Pt notified form ready for pick up 

## 2013-03-26 ENCOUNTER — Other Ambulatory Visit: Payer: Self-pay | Admitting: Internal Medicine

## 2013-03-27 DIAGNOSIS — Z0279 Encounter for issue of other medical certificate: Secondary | ICD-10-CM

## 2013-04-12 ENCOUNTER — Other Ambulatory Visit: Payer: Self-pay

## 2013-05-21 ENCOUNTER — Other Ambulatory Visit: Payer: Self-pay | Admitting: Internal Medicine

## 2013-07-11 ENCOUNTER — Other Ambulatory Visit: Payer: Self-pay | Admitting: *Deleted

## 2013-07-12 ENCOUNTER — Other Ambulatory Visit: Payer: Self-pay | Admitting: *Deleted

## 2013-07-13 ENCOUNTER — Other Ambulatory Visit: Payer: Self-pay | Admitting: *Deleted

## 2013-07-13 MED ORDER — GLIPIZIDE 5 MG PO TABS: ORAL_TABLET | ORAL | Status: DC

## 2013-07-13 NOTE — Telephone Encounter (Signed)
Spoke with pt on need for appointment, appointment scheduled 07/30/13. Rx sent to pharmacy by escript

## 2013-07-17 ENCOUNTER — Telehealth: Payer: Self-pay | Admitting: Internal Medicine

## 2013-07-17 NOTE — Telephone Encounter (Signed)
Patient Information:  Caller Name: Shivaan  Phone: [phone]  Patient: Brett [name], Brett [name]  Gender: Male  DOB: 12/01/1940  Age: 73 Years  PCP: Deborra Medina (Adults only)  Office Follow Up:  Does the office need to follow up with this patient?: Yes  Instructions For The Office: Contact patient to give further instructions.  RN Note:  Please contact patient to give further instructions. Patient verbalized understanding of care advice and will call office before 07/18/13 if symptoms change or new symptoms develop.  Symptoms  Reason For Call & Symptoms: Diarrhea, fever, joint aches. Reports 5 episodes of diarrhea in last 24 hours. Feels more tired than normal today.  Reviewed Health History In EMR: Yes  Reviewed Medications In EMR: Yes  Reviewed Allergies In EMR: Yes  Reviewed Surgeries / Procedures: Yes  Date of Onset of Symptoms: 07/16/2013  Any Fever: Yes  Fever Taken: Oral  Fever Time Of Reading: 14:00:40  Fever Last Reading: 101.0  Guideline(s) Used:  Diarrhea  Disposition Per Guideline:   Callback by PCP Today  Reason For Disposition Reached:   Age > 70 years  Advice Given:  Fluids:  Drink more fluids, at least 8-10 glasses (8 oz or 240 ml) daily.  For example: sports drinks, diluted fruit juices, soft drinks.  Supplement this with saltine crackers or soups to make certain that you are getting sufficient fluid and salt to meet your body's needs.  Avoid caffeinated beverages (Reason: caffeine is mildly dehydrating).  Nutrition:  Maintaining some food intake during episodes of diarrhea is important.  Ideal initial foods include boiled starches/cereals (e.g., potatoes, rice, noodles, wheat, oats) with a small amount of salt to taste.  Other acceptable foods include: bananas, yogurt, crackers, soup.  As your stools return to normal consistency, resume a normal diet.  Expected Course:  Viral diarrhea lasts 4-7 days. Always worse on days 1 and 2.  Call Back If:  Signs of  dehydration occur (e.g., no urine for more than 12 hours, very dry mouth, lightheaded, etc.)  Diarrhea lasts over 7 days  You become worse.  Patient Will Follow Care Advice:  YES

## 2013-07-18 NOTE — Telephone Encounter (Signed)
Left message for patient to return call to office. 

## 2013-07-18 NOTE — Telephone Encounter (Signed)
Please read below...

## 2013-07-23 ENCOUNTER — Other Ambulatory Visit: Payer: Self-pay | Admitting: Internal Medicine

## 2013-07-23 NOTE — Telephone Encounter (Signed)
Appt 07/30/13.

## 2013-07-30 ENCOUNTER — Ambulatory Visit: Payer: Medicare Other | Admitting: Internal Medicine

## 2013-08-06 ENCOUNTER — Other Ambulatory Visit: Payer: Self-pay | Admitting: *Deleted

## 2013-08-07 ENCOUNTER — Other Ambulatory Visit: Payer: Self-pay | Admitting: *Deleted

## 2013-08-07 MED ORDER — GLIPIZIDE 5 MG PO TABS: ORAL_TABLET | ORAL | Status: DC

## 2013-08-07 NOTE — Telephone Encounter (Signed)
Appt 08/14/13.

## 2013-08-14 ENCOUNTER — Ambulatory Visit (INDEPENDENT_AMBULATORY_CARE_PROVIDER_SITE_OTHER): Payer: Medicare Other | Admitting: Internal Medicine

## 2013-08-14 ENCOUNTER — Encounter: Payer: Self-pay | Admitting: Internal Medicine

## 2013-08-14 VITALS — BP 120/62 | HR 60 | Temp 98.3°F | Resp 16 | Wt 190.5 lb

## 2013-08-14 DIAGNOSIS — E1139 Type 2 diabetes mellitus with other diabetic ophthalmic complication: Secondary | ICD-10-CM | POA: Diagnosis not present

## 2013-08-14 DIAGNOSIS — F341 Dysthymic disorder: Secondary | ICD-10-CM

## 2013-08-14 DIAGNOSIS — E785 Hyperlipidemia, unspecified: Secondary | ICD-10-CM

## 2013-08-14 DIAGNOSIS — E119 Type 2 diabetes mellitus without complications: Secondary | ICD-10-CM | POA: Diagnosis not present

## 2013-08-14 DIAGNOSIS — E039 Hypothyroidism, unspecified: Secondary | ICD-10-CM | POA: Diagnosis not present

## 2013-08-14 DIAGNOSIS — F418 Other specified anxiety disorders: Secondary | ICD-10-CM

## 2013-08-14 DIAGNOSIS — E11359 Type 2 diabetes mellitus with proliferative diabetic retinopathy without macular edema: Secondary | ICD-10-CM

## 2013-08-14 DIAGNOSIS — E1059 Type 1 diabetes mellitus with other circulatory complications: Secondary | ICD-10-CM

## 2013-08-14 DIAGNOSIS — E113599 Type 2 diabetes mellitus with proliferative diabetic retinopathy without macular edema, unspecified eye: Secondary | ICD-10-CM

## 2013-08-14 DIAGNOSIS — M719 Bursopathy, unspecified: Secondary | ICD-10-CM

## 2013-08-14 DIAGNOSIS — M6790 Unspecified disorder of synovium and tendon, unspecified site: Secondary | ICD-10-CM

## 2013-08-14 DIAGNOSIS — I10 Essential (primary) hypertension: Secondary | ICD-10-CM

## 2013-08-14 NOTE — Patient Instructions (Signed)
You are doing wel  We'll call you with the results of your labs from today  Return in 3 months for your annual wellness exam

## 2013-08-14 NOTE — Progress Notes (Signed)
Patient ID: Brett [name], male   DOB: 06-Dec-1940, 72 y.o.   MRN: [id number]  Patient Active Problem List   Diagnosis Date Noted  . Urinary frequency 11/17/2012  . Depression with anxiety 08/07/2012  . Pulmonary nodule seen on imaging study   . H/O renal calculi   . Other and unspecified hyperlipidemia 01/04/2012  . Routine general medical examination at a health care facility 01/04/2012  . Multiple vessel coronary artery disease 07/09/2011  . Diabetes mellitus with proliferative retinopathy 07/09/2011  . Lower extremity tendinopathy 07/09/2011  . Unspecified hypothyroidism 07/09/2011  . Hypertension 07/08/2011    Subjective:  CC:   Chief Complaint  Patient presents with  . Follow-up    medication refill    HPI:   Brett [name] is a 73 y.o. male who presents for  Follow up on chronic conditions.  Last seen in June. At which time the diarrhea resolved after stopping metformin and taking flagyl.   Had another episode of self limiting diarrhea in February which lasted a day. ,    DM:  Blood sugars have been 110 to 114 fasting .   No lows on glipizide.  Having eye exam at the [place] in 2 weeks.    No foot pain or numbness .    Since then he was become a widower . He is dating again but not sexually active.   Seeing Krasinski for bilateral bone spurs limiting his ability to exercise due to  Severe foot pain    Past Medical History  Diagnosis Date  . Hypertension   . Hyperlipidemia   . Diabetes mellitus   . Myocardial infarction   . Allergy     resolved after CABG  . Pulmonary nodule seen on imaging study 2012  . H/O renal calculi 2012    Past Surgical History  Procedure Laterality Date  . Coronary artery bypass graft  2006    4 vessel, after 3 or 4 stents        The following portions of the patient's history were reviewed and updated as appropriate: Allergies, current medications, and problem list.    Review of Systems:   Patient denies headache, fevers, malaise,  unintentional weight loss, skin rash, eye pain, sinus congestion and sinus pain, sore throat, dysphagia,  hemoptysis , cough, dyspnea, wheezing, chest pain, palpitations, orthopnea, edema, abdominal pain, nausea, melena, diarrhea, constipation, flank pain, dysuria, hematuria, urinary  Frequency, nocturia, numbness, tingling, seizures,  Focal weakness, Loss of consciousness,  Tremor, insomnia, depression, anxiety, and suicidal ideation.     History   Social History  . Marital Status: Married    Spouse Name: Audelia Acton now deceased    Number of Children: N/A  . Years of Education: N/A   Occupational History  .     Social History Main Topics  . Smoking status: Former Smoker    Types: Cigars    Quit date: 10/04/1981  . Smokeless tobacco: Never Used  . Alcohol Use: 4.2 oz/week    7 Glasses of wine per week  . Drug Use: No  . Sexual Activity: Not Currently   Other Topics Concern  . Not on file   Social History Narrative  . No narrative on file    Objective:  Filed Vitals:   08/14/13 1613  BP: 120/62  Pulse: 60  Temp: 98.3 F (36.8 C)  Resp: 16     General appearance: alert, cooperative and appears stated age Ears: normal TM's and external ear canals both ears Throat: lips,  mucosa, and tongue normal; teeth and gums normal Neck: no adenopathy, no carotid bruit, supple, symmetrical, trachea midline and thyroid not enlarged, symmetric, no tenderness/mass/nodules Back: symmetric, no curvature. ROM normal. No CVA tenderness. Lungs: clear to auscultation bilaterally Heart: regular rate and rhythm, S1, S2 normal, no murmur, click, rub or gallop Abdomen: soft, non-tender; bowel sounds normal; no masses,  no organomegaly Pulses: 2+ and symmetric Skin: Skin color, texture, turgor normal. No rashes or lesions Lymph nodes: Cervical, supraclavicular, and axillary nodes normal.  Assessment and Plan:  Diabetes mellitus with proliferative retinopathy  well-controlled on current  medications.  hemoglobin A1c has been consistently at or  less than 7.0 . Patient is up-to-date on eye exams and foot exam is normal today. t. Patient is tolerating statin therapy for CAD risk reduction and on ACE/ARB for reduction in proteinuria. Lab Results  Component Value Date   HGBA1C 6.8* 08/14/2013   Lab Results  Component Value Date   MICROALBUR 0.4 08/14/2013     Hypertension Well controlled on current regimen. Renal function stable, no changes today.  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  Considering surgery by Mack Guise  Other and unspecified hyperlipidemia Well controlled on current statin therapy.   Liver enzymes are normal , no changes today.  Lab Results  Component Value Date   CHOL 168 08/07/2012   HDL 52.00 08/07/2012   LDLCALC 97 08/07/2012   LDLDIRECT 74.0 08/14/2013   TRIG 97.0 08/07/2012   CHOLHDL 3 08/07/2012   Lab Results  Component Value Date   ALT 26 08/14/2013   AST 22 08/14/2013   ALKPHOS 64 08/14/2013   BILITOT 0.4 08/14/2013     Depression with anxiety Aggravated by wife's progressive dementia, now improved since she has passed.  Will discuss tapering off of medication at next visit     Unspecified hypothyroidism Thyroid function is normal on current dose. No changes today   Lab Results  Component Value Date   TSH 2.90 08/14/2013    A total of 40 minutes was spent with patient more than half of which was spent in counseling, reviewing records from other prviders and coordination of care.   Updated Medication List Outpatient Encounter Prescriptions as of 08/14/2013  Medication Sig  . ALPRAZolam (XANAX) 0.5 MG tablet TAKE 1 TABLET BY MOUTH AT BEDTIME AS NEEDED FOR SLEEP  . aspirin 81 MG tablet Take 1 tablet (81 mg total) by mouth daily.  Marland Kitchen atorvastatin (LIPITOR) 40 MG tablet Take 1 tablet (40 mg total) by mouth daily.  .  diphenhydrAMINE (SOMINEX) 25 MG tablet Take 25 mg by mouth at bedtime as needed.  Marland Kitchen glipiZIDE (GLUCOTROL) 5 MG tablet TAKE 1 TABLET BY MOUTH TWICE A DAY BEFORE A MEAL AND 1/2 TAB BEFORE DINNER  . glucose blood (PRECISION XTRA TEST STRIPS) test strip Test blood sugars once daily .  Use one test strip each time blood sugar is checked.  Marland Kitchen glucose blood test strip Please use one test strip each time sugar is to be tested.   Dx. 250.00  . levothyroxine (SYNTHROID, LEVOTHROID) 112 MCG tablet TAKE 1 TABLET BY MOUTH EVERY DAY  . lisinopril (PRINIVIL,ZESTRIL) 2.5 MG tablet TAKE 1 TABLET BY MOUTH EVERY DAY  . metoprolol tartrate (LOPRESSOR) 25 MG tablet Take 25 mg by mouth daily.  Marland Kitchen venlafaxine (EFFEXOR) 75 MG tablet Take 75 mg by mouth daily.  . [  DISCONTINUED] Calcium Carbonate-Vitamin D (CALCIUM 600+D PO) Take one by mouth daily  . [DISCONTINUED] ibuprofen (ADVIL,MOTRIN) 800 MG tablet Take one by mouth daily  . [DISCONTINUED] Omega-3 Fatty Acids (FISH OIL) 1000 MG CAPS Take one by mouth three times a week     Orders Placed This Encounter  Procedures  . Comprehensive metabolic panel  . TSH  . LDL cholesterol, direct  . Hemoglobin A1c  . Microalbumin / creatinine urine ratio    Return in about 3 months (around 11/14/2013).

## 2013-08-15 ENCOUNTER — Encounter: Payer: Self-pay | Admitting: Internal Medicine

## 2013-08-15 LAB — COMPREHENSIVE METABOLIC PANEL
ALBUMIN: 4.4 g/dL (ref 3.5–5.2)
ALK PHOS: 64 U/L (ref 39–117)
ALT: 26 U/L (ref 0–53)
AST: 22 U/L (ref 0–37)
BUN: 20 mg/dL (ref 6–23)
CHLORIDE: 104 meq/L (ref 96–112)
CO2: 23 mEq/L (ref 19–32)
CREATININE: 1 mg/dL (ref 0.4–1.5)
Calcium: 9 mg/dL (ref 8.4–10.5)
GFR: 80.28 mL/min (ref >60.00)
Glucose, Bld: 112 mg/dL — ABNORMAL HIGH (ref 70–99)
POTASSIUM: 4 meq/L (ref 3.5–5.1)
Sodium: 137 mEq/L (ref 135–145)
Total Bilirubin: 0.4 mg/dL (ref 0.3–1.2)
Total Protein: 7.5 g/dL (ref 6.0–8.3)

## 2013-08-15 LAB — HEMOGLOBIN A1C: HEMOGLOBIN A1C: 6.8 % — AB (ref 4.6–6.5)

## 2013-08-15 LAB — MICROALBUMIN / CREATININE URINE RATIO
Creatinine,U: 241.6 mg/dL
MICROALB/CREAT RATIO: 0.2 mg/g (ref 0.0–30.0)
Microalb, Ur: 0.4 mg/dL (ref 0.0–1.9)

## 2013-08-15 LAB — LDL CHOLESTEROL, DIRECT: Direct LDL: 74 mg/dL

## 2013-08-15 LAB — TSH: TSH: 2.9 u[IU]/mL (ref 0.35–5.50)

## 2013-08-15 NOTE — Assessment & Plan Note (Signed)
well-controlled on current medications.  hemoglobin A1c has been consistently at or  less than 7.0 . Patient is up-to-date on eye exams and foot exam is normal today. t. Patient is tolerating statin therapy for CAD risk reduction and on ACE/ARB for reduction in proteinuria. Lab Results  Component Value Date   HGBA1C 6.8* 08/14/2013   Lab Results  Component Value Date   MICROALBUR 0.4 08/14/2013

## 2013-08-15 NOTE — Assessment & Plan Note (Signed)
Thyroid function is normal on current dose. No changes today   Lab Results  Component Value Date   TSH 2.90 08/14/2013

## 2013-08-15 NOTE — Assessment & Plan Note (Signed)
Secondary to bone spurs, which are causing significant pain .  Considering surgery by Mack Guise

## 2013-08-15 NOTE — Assessment & Plan Note (Signed)
Well controlled on current regimen. Renal function stable, no changes today.  Lab Results  Component Value Date   CREATININE 1.0 08/14/2013

## 2013-08-15 NOTE — Assessment & Plan Note (Signed)
Aggravated by wife's progressive dementia, now improved since she has passed.  Will discuss tapering off of medication at next visit

## 2013-08-15 NOTE — Assessment & Plan Note (Signed)
Well controlled on current statin therapy.   Liver enzymes are normal , no changes today.  Lab Results  Component Value Date   CHOL 168 08/07/2012   HDL 52.00 08/07/2012   LDLCALC 97 08/07/2012   LDLDIRECT 74.0 08/14/2013   TRIG 97.0 08/07/2012   CHOLHDL 3 08/07/2012   Lab Results  Component Value Date   ALT 26 08/14/2013   AST 22 08/14/2013   ALKPHOS 64 08/14/2013   BILITOT 0.4 08/14/2013

## 2013-08-16 DIAGNOSIS — M7660 Achilles tendinitis, unspecified leg: Secondary | ICD-10-CM | POA: Diagnosis not present

## 2013-08-17 ENCOUNTER — Telehealth: Payer: Self-pay

## 2013-08-17 NOTE — Telephone Encounter (Signed)
Relevant patient education assigned to patient using Emmi. ° °

## 2013-08-17 NOTE — Telephone Encounter (Signed)
Mailed unread message to pt  

## 2013-08-24 ENCOUNTER — Ambulatory Visit: Payer: Self-pay | Admitting: Podiatry

## 2013-08-30 DIAGNOSIS — M7660 Achilles tendinitis, unspecified leg: Secondary | ICD-10-CM | POA: Diagnosis not present

## 2013-08-30 DIAGNOSIS — M6281 Muscle weakness (generalized): Secondary | ICD-10-CM | POA: Diagnosis not present

## 2013-08-30 DIAGNOSIS — M25579 Pain in unspecified ankle and joints of unspecified foot: Secondary | ICD-10-CM | POA: Diagnosis not present

## 2013-08-30 DIAGNOSIS — M25676 Stiffness of unspecified foot, not elsewhere classified: Secondary | ICD-10-CM | POA: Diagnosis not present

## 2013-08-30 DIAGNOSIS — M25673 Stiffness of unspecified ankle, not elsewhere classified: Secondary | ICD-10-CM | POA: Diagnosis not present

## 2013-09-03 DIAGNOSIS — M7660 Achilles tendinitis, unspecified leg: Secondary | ICD-10-CM | POA: Diagnosis not present

## 2013-09-03 DIAGNOSIS — M25579 Pain in unspecified ankle and joints of unspecified foot: Secondary | ICD-10-CM | POA: Diagnosis not present

## 2013-09-03 DIAGNOSIS — M6281 Muscle weakness (generalized): Secondary | ICD-10-CM | POA: Diagnosis not present

## 2013-09-03 DIAGNOSIS — M25673 Stiffness of unspecified ankle, not elsewhere classified: Secondary | ICD-10-CM | POA: Diagnosis not present

## 2013-09-03 DIAGNOSIS — M25676 Stiffness of unspecified foot, not elsewhere classified: Secondary | ICD-10-CM | POA: Diagnosis not present

## 2013-09-05 DIAGNOSIS — M25676 Stiffness of unspecified foot, not elsewhere classified: Secondary | ICD-10-CM | POA: Diagnosis not present

## 2013-09-05 DIAGNOSIS — M6281 Muscle weakness (generalized): Secondary | ICD-10-CM | POA: Diagnosis not present

## 2013-09-05 DIAGNOSIS — M7660 Achilles tendinitis, unspecified leg: Secondary | ICD-10-CM | POA: Diagnosis not present

## 2013-09-05 DIAGNOSIS — M25579 Pain in unspecified ankle and joints of unspecified foot: Secondary | ICD-10-CM | POA: Diagnosis not present

## 2013-09-05 DIAGNOSIS — M25673 Stiffness of unspecified ankle, not elsewhere classified: Secondary | ICD-10-CM | POA: Diagnosis not present

## 2013-09-10 DIAGNOSIS — M6281 Muscle weakness (generalized): Secondary | ICD-10-CM | POA: Diagnosis not present

## 2013-09-10 DIAGNOSIS — M7660 Achilles tendinitis, unspecified leg: Secondary | ICD-10-CM | POA: Diagnosis not present

## 2013-09-10 DIAGNOSIS — M25676 Stiffness of unspecified foot, not elsewhere classified: Secondary | ICD-10-CM | POA: Diagnosis not present

## 2013-09-10 DIAGNOSIS — M25673 Stiffness of unspecified ankle, not elsewhere classified: Secondary | ICD-10-CM | POA: Diagnosis not present

## 2013-09-10 DIAGNOSIS — M25579 Pain in unspecified ankle and joints of unspecified foot: Secondary | ICD-10-CM | POA: Diagnosis not present

## 2013-09-14 LAB — HM DIABETES EYE EXAM

## 2013-09-17 DIAGNOSIS — M25673 Stiffness of unspecified ankle, not elsewhere classified: Secondary | ICD-10-CM | POA: Diagnosis not present

## 2013-09-17 DIAGNOSIS — M7660 Achilles tendinitis, unspecified leg: Secondary | ICD-10-CM | POA: Diagnosis not present

## 2013-09-17 DIAGNOSIS — M6281 Muscle weakness (generalized): Secondary | ICD-10-CM | POA: Diagnosis not present

## 2013-09-17 DIAGNOSIS — M25676 Stiffness of unspecified foot, not elsewhere classified: Secondary | ICD-10-CM | POA: Diagnosis not present

## 2013-09-17 DIAGNOSIS — M25579 Pain in unspecified ankle and joints of unspecified foot: Secondary | ICD-10-CM | POA: Diagnosis not present

## 2013-09-27 DIAGNOSIS — M6281 Muscle weakness (generalized): Secondary | ICD-10-CM | POA: Diagnosis not present

## 2013-09-27 DIAGNOSIS — M25579 Pain in unspecified ankle and joints of unspecified foot: Secondary | ICD-10-CM | POA: Diagnosis not present

## 2013-09-27 DIAGNOSIS — M7660 Achilles tendinitis, unspecified leg: Secondary | ICD-10-CM | POA: Diagnosis not present

## 2013-09-27 DIAGNOSIS — M25673 Stiffness of unspecified ankle, not elsewhere classified: Secondary | ICD-10-CM | POA: Diagnosis not present

## 2013-09-29 ENCOUNTER — Other Ambulatory Visit: Payer: Self-pay | Admitting: Internal Medicine

## 2013-10-01 ENCOUNTER — Other Ambulatory Visit: Payer: Self-pay | Admitting: *Deleted

## 2013-10-01 MED ORDER — GLIPIZIDE 5 MG PO TABS: ORAL_TABLET | ORAL | Status: DC

## 2013-10-02 ENCOUNTER — Ambulatory Visit: Payer: Medicare Other | Admitting: Cardiovascular Disease

## 2013-10-03 DIAGNOSIS — M25579 Pain in unspecified ankle and joints of unspecified foot: Secondary | ICD-10-CM | POA: Diagnosis not present

## 2013-10-03 DIAGNOSIS — M25676 Stiffness of unspecified foot, not elsewhere classified: Secondary | ICD-10-CM | POA: Diagnosis not present

## 2013-10-03 DIAGNOSIS — M25673 Stiffness of unspecified ankle, not elsewhere classified: Secondary | ICD-10-CM | POA: Diagnosis not present

## 2013-10-03 DIAGNOSIS — M6281 Muscle weakness (generalized): Secondary | ICD-10-CM | POA: Diagnosis not present

## 2013-10-03 DIAGNOSIS — M7660 Achilles tendinitis, unspecified leg: Secondary | ICD-10-CM | POA: Diagnosis not present

## 2013-10-08 ENCOUNTER — Telehealth: Payer: Self-pay | Admitting: Internal Medicine

## 2013-10-08 DIAGNOSIS — M7660 Achilles tendinitis, unspecified leg: Secondary | ICD-10-CM | POA: Diagnosis not present

## 2013-10-08 DIAGNOSIS — M6281 Muscle weakness (generalized): Secondary | ICD-10-CM | POA: Diagnosis not present

## 2013-10-08 DIAGNOSIS — M25579 Pain in unspecified ankle and joints of unspecified foot: Secondary | ICD-10-CM | POA: Diagnosis not present

## 2013-10-08 DIAGNOSIS — M25673 Stiffness of unspecified ankle, not elsewhere classified: Secondary | ICD-10-CM | POA: Diagnosis not present

## 2013-10-08 NOTE — Telephone Encounter (Signed)
Patient stated he will call back for an appointment at better time for him because to much going on right now with vacation and all.

## 2013-10-08 NOTE — Telephone Encounter (Signed)
States PT doctor saw that pt has a hernia in stomach.  Raised area, no pain.  States PT doctor advised him to have his PCP send him to have it checked out.  Pt asking if he has to see Dr. Derrel Nip first or if she can go ahead refer him.

## 2013-10-08 NOTE — Telephone Encounter (Signed)
I have no documentation of hernia.  Would need to see him before i do a referral.

## 2013-10-08 NOTE — Telephone Encounter (Signed)
Please advise would you need to see patient before referral for Hernia in abdomen? Right?

## 2013-10-09 ENCOUNTER — Encounter: Payer: Self-pay | Admitting: *Deleted

## 2013-10-11 DIAGNOSIS — M25676 Stiffness of unspecified foot, not elsewhere classified: Secondary | ICD-10-CM | POA: Diagnosis not present

## 2013-10-11 DIAGNOSIS — M6281 Muscle weakness (generalized): Secondary | ICD-10-CM | POA: Diagnosis not present

## 2013-10-11 DIAGNOSIS — M6200 Separation of muscle (nontraumatic), unspecified site: Secondary | ICD-10-CM | POA: Diagnosis not present

## 2013-10-11 DIAGNOSIS — M25673 Stiffness of unspecified ankle, not elsewhere classified: Secondary | ICD-10-CM | POA: Diagnosis not present

## 2013-10-11 DIAGNOSIS — M25579 Pain in unspecified ankle and joints of unspecified foot: Secondary | ICD-10-CM | POA: Diagnosis not present

## 2013-10-11 DIAGNOSIS — M7660 Achilles tendinitis, unspecified leg: Secondary | ICD-10-CM | POA: Diagnosis not present

## 2013-10-15 ENCOUNTER — Encounter: Payer: Self-pay | Admitting: Cardiovascular Disease

## 2013-10-15 ENCOUNTER — Ambulatory Visit (INDEPENDENT_AMBULATORY_CARE_PROVIDER_SITE_OTHER): Payer: Medicare Other | Admitting: Cardiovascular Disease

## 2013-10-15 VITALS — BP 119/68 | HR 51 | Ht 71.0 in | Wt 185.5 lb

## 2013-10-15 DIAGNOSIS — I25810 Atherosclerosis of coronary artery bypass graft(s) without angina pectoris: Secondary | ICD-10-CM

## 2013-10-15 DIAGNOSIS — I2510 Atherosclerotic heart disease of native coronary artery without angina pectoris: Secondary | ICD-10-CM | POA: Diagnosis not present

## 2013-10-15 DIAGNOSIS — E785 Hyperlipidemia, unspecified: Secondary | ICD-10-CM | POA: Diagnosis not present

## 2013-10-15 DIAGNOSIS — I10 Essential (primary) hypertension: Secondary | ICD-10-CM

## 2013-10-15 NOTE — Assessment & Plan Note (Signed)
His last LDL was 74.  Continue current dose and continue his diet and exercise regimen.

## 2013-10-15 NOTE — Assessment & Plan Note (Signed)
Tiny has a hx of CAD - s/p CABG in 2006.  He has done well.  He exercises on a regular basis. He denies any chest pain or shortness of breath. His lipid levels are well controlled. His last LDL is 74.  He'll continue to followup with his general medical doctor Deborra Medina, MD)  I'll see him in one year for followup visit. I've encouraged him to call me if he has any additional questions.

## 2013-10-15 NOTE — Progress Notes (Signed)
Brett [name] Date of Birth  Jul 04, 1940       [facility] Office [address], [address], [place], [place]  [address]   [place], [place]  [address] [address]   Fax  [phone]     Fax [phone]  Problem List: 1. Coronary artery disease, CABG at [facility]  2. Hyperlipidemia 3.  Diabetes mellitus 4. Hypothyroidism   History of Present Illness:  Oct 15, 2013:  Brett [name] is a 73 yo with hx of CAD , CABG in 2006.  He presents today for transfer of care.  He was formerly seen by [facility] . He's here to reestablish care with a cardiologist. He is moved from [facility] to [place].  He exercises on a regular basis.  No CP  , no dyspnea.  He is retired from the [employer])     Current Outpatient Prescriptions on File Prior to Visit  Medication Sig Dispense Refill  . ALPRAZolam (XANAX) 0.5 MG tablet TAKE 1 TABLET BY MOUTH AT BEDTIME AS NEEDED FOR SLEEP  30 tablet  2  . aspirin 81 MG tablet Take 1 tablet (81 mg total) by mouth daily.  30 tablet  11  . atorvastatin (LIPITOR) 40 MG tablet Take 1 tablet (40 mg total) by mouth daily.  90 tablet  3  . diphenhydrAMINE (SOMINEX) 25 MG tablet Take 25 mg by mouth at bedtime as needed.      Marland Kitchen glipiZIDE (GLUCOTROL) 5 MG tablet TAKE 1 TABLET BY MOUTH TWICE A DAY BEFORE A MEAL AND 1/2 TAB BEFORE DINNER  60 tablet  5  . glucose blood (PRECISION XTRA TEST STRIPS) test strip Test blood sugars once daily .  Use one test strip each time blood sugar is checked.  100 each  6  . glucose blood test strip Please use one test strip each time sugar is to be tested.   Dx. 250.00  100 each  12  . levothyroxine (SYNTHROID, LEVOTHROID) 112 MCG tablet TAKE 1 TABLET BY MOUTH EVERY DAY  30 tablet  0  . levothyroxine (SYNTHROID, LEVOTHROID) 112 MCG tablet TAKE 1 TABLET BY MOUTH EVERY DAY  90 tablet  3  . lisinopril (PRINIVIL,ZESTRIL) 2.5 MG  tablet TAKE 1 TABLET BY MOUTH EVERY DAY  90 tablet  1  . metoprolol tartrate (LOPRESSOR) 25 MG tablet Take 25 mg by mouth daily.      Marland Kitchen venlafaxine (EFFEXOR) 75 MG tablet Take 75 mg by mouth daily.       No current facility-administered medications on file prior to visit.    No Known Allergies  Past Medical History  Diagnosis Date  . Hypertension   . Hyperlipidemia   . Diabetes mellitus   . Myocardial infarction   . Allergy     resolved after CABG  . Pulmonary nodule seen on imaging study 2012  . H/O renal calculi 2012  . Peyronie's disease   . Impotence of organic origin   . Swelling, mass, or lump in chest   . Unspecified hypothyroidism   . Hematuria, unspecified   . Calculus of ureter   . Coronary artery disease     Past Surgical History  Procedure Laterality Date  . 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 Colonoscopy    . Coronary artery bypass graft    .  Penile prosthesis implant      History  Smoking status  . Former Smoker  . Types: Cigars  . Quit date: 10/04/1981  Smokeless tobacco  . Never Used    History  Alcohol Use  . 4.2 oz/week  . 7 Glasses of wine per week    Family History  Problem Relation Age of Onset  . Hyperlipidemia Mother   . AAA (abdominal aortic aneurysm) Mother   . Heart disease Father 38    Reviw of Systems:  Reviewed in the HPI.  All other systems are negative.  Physical Exam: There were no vitals taken for this visit. Wt Readings from Last 3 Encounters:  08/14/13 190 lb 8 oz (86.41 kg)  11/23/12 178 lb 8 oz (80.967 kg)  11/17/12 179 lb 8 oz (81.421 kg)     General: Well developed, well nourished, in no acute distress.  Head: Normocephalic, atraumatic, sclera non-icteric, mucus membranes are moist,   Neck: Supple. Carotids are 2 + without bruits. No JVD   Lungs: Clear   Heart: RR, brady, normal s1s2  Abdomen: Soft, non-tender, non-distended with normal bowel sounds.  Msk:   Strength and tone are normal   Extremities: No clubbing or cyanosis. No edema.  Distal pedal pulses are 2+ and equal    Neuro: CN II - XII intact.  Alert and oriented X 3.   Psych:  Normal   ECG: Oct 15, 2013:  Sinus brady at 57.  NS ST abnormalities.   Assessment / Plan:

## 2013-10-15 NOTE — Patient Instructions (Signed)
Your physician wants you to follow-up in: 1 year  You will receive a reminder letter in the mail two months in advance. If you don't receive a letter, please call our office to schedule the follow-up appointment.  Your physician recommends that you continue on your current medications as directed. Please refer to the Current Medication list given to you today.  

## 2013-10-15 NOTE — Assessment & Plan Note (Signed)
Continue with his current medications.

## 2013-11-14 ENCOUNTER — Ambulatory Visit (INDEPENDENT_AMBULATORY_CARE_PROVIDER_SITE_OTHER): Payer: Medicare Other | Admitting: Internal Medicine

## 2013-11-14 ENCOUNTER — Encounter: Payer: Self-pay | Admitting: Internal Medicine

## 2013-11-14 VITALS — BP 118/58 | HR 60 | Temp 98.7°F | Resp 16 | Ht 70.75 in | Wt 185.5 lb

## 2013-11-14 DIAGNOSIS — I2510 Atherosclerotic heart disease of native coronary artery without angina pectoris: Secondary | ICD-10-CM

## 2013-11-14 DIAGNOSIS — R5383 Other fatigue: Principal | ICD-10-CM

## 2013-11-14 DIAGNOSIS — R5381 Other malaise: Secondary | ICD-10-CM | POA: Diagnosis not present

## 2013-11-14 DIAGNOSIS — E113599 Type 2 diabetes mellitus with proliferative diabetic retinopathy without macular edema, unspecified eye: Secondary | ICD-10-CM

## 2013-11-14 DIAGNOSIS — E1139 Type 2 diabetes mellitus with other diabetic ophthalmic complication: Secondary | ICD-10-CM | POA: Diagnosis not present

## 2013-11-14 DIAGNOSIS — E119 Type 2 diabetes mellitus without complications: Secondary | ICD-10-CM

## 2013-11-14 DIAGNOSIS — Z125 Encounter for screening for malignant neoplasm of prostate: Secondary | ICD-10-CM | POA: Diagnosis not present

## 2013-11-14 DIAGNOSIS — E785 Hyperlipidemia, unspecified: Secondary | ICD-10-CM | POA: Diagnosis not present

## 2013-11-14 DIAGNOSIS — Z0000 Encounter for general adult medical examination without abnormal findings: Secondary | ICD-10-CM | POA: Diagnosis not present

## 2013-11-14 DIAGNOSIS — M719 Bursopathy, unspecified: Secondary | ICD-10-CM

## 2013-11-14 DIAGNOSIS — M6790 Unspecified disorder of synovium and tendon, unspecified site: Secondary | ICD-10-CM | POA: Diagnosis not present

## 2013-11-14 DIAGNOSIS — M67969 Unspecified disorder of synovium and tendon, unspecified lower leg: Secondary | ICD-10-CM

## 2013-11-14 DIAGNOSIS — Z23 Encounter for immunization: Secondary | ICD-10-CM

## 2013-11-14 LAB — HM DIABETES FOOT EXAM: HM DIABETIC FOOT EXAM: NORMAL

## 2013-11-14 MED ORDER — PROMETHAZINE HCL 12.5 MG RE SUPP: 12.5000 mg | Freq: Four times a day (QID) | RECTAL | Status: DC | PRN

## 2013-11-14 MED ORDER — CIPROFLOXACIN HCL 500 MG PO TABS
250.0000 mg | ORAL_TABLET | Freq: Two times a day (BID) | ORAL | Status: DC
Start: 2013-11-14 — End: 2014-05-27

## 2013-11-14 MED ORDER — METRONIDAZOLE 500 MG PO TABS: 500.0000 mg | ORAL_TABLET | Freq: Three times a day (TID) | ORAL | Status: DC

## 2013-11-14 MED ORDER — PROMETHAZINE HCL 25 MG PO TABS: 25.0000 mg | ORAL_TABLET | Freq: Three times a day (TID) | ORAL | Status: DC | PRN

## 2013-11-14 NOTE — Assessment & Plan Note (Signed)
Under treratment by [place].

## 2013-11-14 NOTE — Assessment & Plan Note (Signed)
.  Annual male exam was done including testicular and prostate exam. PSA is pending .  Colon ca screening was reviewed

## 2013-11-14 NOTE — Patient Instructions (Addendum)
You had your annual Medicare wellness exam today  You need to have a Shingles  vaccine at some point,  But  I will check your chicken pox titre with your labs to ensure it is safe to receive the vaccine    Most local pharmacies will have this available with a prescription .   Please make an appt with the front desk for a fasting lab visit ; We will contact you with the bloodwork results   Some travel medications for you:  cipro and flagyl for diarrhea  promethazine is for nause and vomiting

## 2013-11-14 NOTE — Progress Notes (Signed)
Pre-visit discussion using our clinic review tool. No additional management support is needed unless otherwise documented below in the visit note.  

## 2013-11-14 NOTE — Assessment & Plan Note (Signed)
Taking glipizide 5 mg daily in the AM  Will not change dose unless a1c is > 7.0  Historically well-controlled on current medications.  hemoglobin A1c has been consistently at or  less than 7.0 . Patient is up-to-date on eye exams and foot exam is normal today. Patient is due for urine microalbumin to creatinine ratio at next visit. Patient is tolerating statin therapy for CAD risk reduction and on ACE/ARB for reduction in proteinuria.  Current labs are pending

## 2013-11-14 NOTE — Progress Notes (Signed)
Patient ID: Brett [name], male   DOB: Apr 17, 1941, 73 y.o.   MRN: [id number]  . The patient is here for annual Medicare wellness examination and management of other chronic and acute problems.  Heel pain:  Seeing Krazinski for bone spur on achilles tendon/heel on the left that is very painful, limiting  His walking .  He was pas prescribed PT and prednisone which he has not started., .  Using motrin  800 mg  Tid and icing it.  Wants to postpone steroid injection until just before his anticipated cruise in August .    DM:  Sugars < 120  symptoms of lows between meals .  Has intentionally lost weight  5 lbs.  CAD: Had annula cardiology evaluation with Liam Rogers in may.   The risk factors are reflected in the social history.  The roster of all physicians providing medical care to patient - is listed in the Snapshot section of the chart.  Activities of daily living:  The patient is 100% independent in all ADLs: dressing, toileting, feeding as well as independent mobility  Home safety : The patient has smoke detectors in the home. They wear seatbelts.  There are no firearms at home. There is no violence in the home.   There is no risks for hepatitis, STDs or HIV. There is no   history of blood transfusion. They have no travel history to infectious disease endemic areas of the world.  The patient has seen their dentist in the last six month. They have seen their eye doctor in the last year. They admit to slight hearing difficulty with regard to whispered voices and some television programs.  They have deferred audiologic testing in the last year.  They do not  have excessive sun exposure. Discussed the need for sun protection: hats, long sleeves and use of sunscreen if there is significant sun exposure.   Diet: the importance of a healthy diet is discussed. They do have a healthy diet.  The benefits of regular aerobic exercise were discussed. She walks 4 times per week ,  20 minutes.   Depression  screen: there are no signs or vegative symptoms of depression- irritability, change in appetite, anhedonia, sadness/tearfullness.  Cognitive assessment: the patient manages all their financial and personal affairs and is actively engaged. They could relate day,date,year and events; recalled 2/3 objects at 3 minutes; performed clock-face test normally.  The following portions of the patient's history were reviewed and updated as appropriate: allergies, current medications, past family history, past medical history,  past surgical history, past social history  and problem list.  Visual acuity was not assessed per patient preference since she has regular follow up with her ophthalmologist. Hearing and body mass index were assessed and reviewed.   During the course of the visit the patient was educated and counseled about appropriate screening and preventive services including : fall prevention , diabetes screening, nutrition counseling, colorectal cancer screening, and recommended immunizations.    Objective:  BP 118/58  Pulse 60  Temp(Src) 98.7 F (37.1 C) (Oral)  Resp 16  Ht 5' 10.75" (1.797 m)  Wt 185 lb 8 oz (84.142 kg)  BMI 26.06 kg/m2  SpO2 97%  General Appearance:    Alert, cooperative, no distress, appears stated age  Head:    Normocephalic, without obvious abnormality, atraumatic  Eyes:    PERRL, conjunctiva/corneas clear, EOM's intact, fundi    benign, both eyes       Ears:    Normal TM's  and external ear canals, both ears  Nose:   Nares normal, septum midline, mucosa normal, no drainage   or sinus tenderness  Throat:   Lips, mucosa, and tongue normal; teeth and gums normal  Neck:   Supple, symmetrical, trachea midline, no adenopathy;       thyroid:  No enlargement/tenderness/nodules; no carotid   bruit or JVD  Back:     Symmetric, no curvature, ROM normal, no CVA tenderness  Lungs:     Clear to auscultation bilaterally, respirations unlabored  Chest wall:    No tenderness  or deformity  Heart:    Regular rate and rhythm, S1 and S2 normal, no murmur, rub   or gallop  Abdomen:     Soft, non-tender, bowel sounds active all four quadrants,    no masses, no organomegaly  Genitalia:    Normal male without lesion, discharge or tenderness  Rectal:    Normal tone, normal prostate, no masses or tenderness;   guaiac negative stool  Extremities:   Extremities normal, atraumatic, no cyanosis or edema  Pulses:   2+ and symmetric all extremities  Skin:   Skin color, texture, turgor normal, no rashes or lesions  Lymph nodes:   Cervical, supraclavicular, and axillary nodes normal  Neurologic:   CNII-XII intact. Normal strength, sensation and reflexes      throughout   Assessment and Plan:  Diabetes mellitus with proliferative retinopathy Taking glipizide 5 mg daily in the AM  Will not change dose unless a1c is > 7.0  Historically well-controlled on current medications.  hemoglobin A1c has been consistently at or  less than 7.0 . Patient is up-to-date on eye exams and foot exam is normal today. Patient is due for urine microalbumin to creatinine ratio at next visit. Patient is tolerating statin therapy for CAD risk reduction and on ACE/ARB for reduction in proteinuria.  Current labs are pending   Lower extremity tendinopathy Under treratment by [place].   Routine general medical examination at a health care facility .Annual male exam was done including testicular and prostate exam. PSA is pending .  Colon ca screening was reviewed    Updated Medication List Outpatient Encounter Prescriptions as of 11/14/2013  Medication Sig  . aspirin 81 MG tablet Take 1 tablet (81 mg total) by mouth daily.  Marland Kitchen atorvastatin (LIPITOR) 40 MG tablet Take 1 tablet (40 mg total) by mouth daily.  Marland Kitchen glipiZIDE (GLUCOTROL) 5 MG tablet Take 5 mg by mouth daily before breakfast.  . glucose blood (PRECISION XTRA TEST STRIPS) test strip Test blood sugars once daily .  Use one test strip  each time blood sugar is checked.  Marland Kitchen glucose blood test strip Please use one test strip each time sugar is to be tested.   Dx. 250.00  . levothyroxine (SYNTHROID, LEVOTHROID) 112 MCG tablet TAKE 1 TABLET BY MOUTH EVERY DAY  . lisinopril (PRINIVIL,ZESTRIL) 5 MG tablet Take 5 mg by mouth daily.  . metoprolol tartrate (LOPRESSOR) 25 MG tablet Take 25 mg by mouth daily.  Marland Kitchen venlafaxine (EFFEXOR) 75 MG tablet Take 75 mg by mouth daily.  Marland Kitchen ALPRAZolam (XANAX) 0.5 MG tablet TAKE 1 TABLET BY MOUTH AT BEDTIME AS NEEDED FOR SLEEP  . ciprofloxacin (CIPRO) 500 MG tablet Take 0.5 tablets (250 mg total) by mouth 2 (two) times daily.  . diphenhydrAMINE (SOMINEX) 25 MG tablet Take 25 mg by mouth at bedtime as needed.  . metroNIDAZOLE (FLAGYL) 500 MG tablet Take 1 tablet (500 mg total)  by mouth 3 (three) times daily.  . promethazine (PHENERGAN) 12.5 MG suppository Place 1 suppository (12.5 mg total) rectally every 6 (six) hours as needed for nausea or vomiting.  . promethazine (PHENERGAN) 25 MG tablet Take 1 tablet (25 mg total) by mouth every 8 (eight) hours as needed for nausea or vomiting.

## 2013-11-22 ENCOUNTER — Encounter: Payer: Self-pay | Admitting: Internal Medicine

## 2013-11-22 ENCOUNTER — Other Ambulatory Visit (INDEPENDENT_AMBULATORY_CARE_PROVIDER_SITE_OTHER): Payer: Medicare Other

## 2013-11-22 DIAGNOSIS — E785 Hyperlipidemia, unspecified: Secondary | ICD-10-CM | POA: Diagnosis not present

## 2013-11-22 DIAGNOSIS — R5381 Other malaise: Secondary | ICD-10-CM | POA: Diagnosis not present

## 2013-11-22 DIAGNOSIS — Z23 Encounter for immunization: Secondary | ICD-10-CM

## 2013-11-22 DIAGNOSIS — R5383 Other fatigue: Secondary | ICD-10-CM | POA: Diagnosis not present

## 2013-11-22 DIAGNOSIS — E119 Type 2 diabetes mellitus without complications: Secondary | ICD-10-CM

## 2013-11-22 DIAGNOSIS — Z125 Encounter for screening for malignant neoplasm of prostate: Secondary | ICD-10-CM | POA: Diagnosis not present

## 2013-11-22 LAB — COMPREHENSIVE METABOLIC PANEL
ALK PHOS: 65 U/L (ref 39–117)
ALT: 24 U/L (ref 0–53)
AST: 22 U/L (ref 0–37)
Albumin: 4.3 g/dL (ref 3.5–5.2)
BUN: 24 mg/dL — AB (ref 6–23)
CO2: 27 mEq/L (ref 19–32)
CREATININE: 1 mg/dL (ref 0.4–1.5)
Calcium: 9.3 mg/dL (ref 8.4–10.5)
Chloride: 104 mEq/L (ref 96–112)
GFR: 75.74 mL/min (ref >60.00)
Glucose, Bld: 119 mg/dL — ABNORMAL HIGH (ref 70–99)
Potassium: 4.4 mEq/L (ref 3.5–5.1)
Sodium: 138 mEq/L (ref 135–145)
Total Bilirubin: 0.7 mg/dL (ref 0.2–1.2)
Total Protein: 7.3 g/dL (ref 6.0–8.3)

## 2013-11-22 LAB — CBC WITH DIFFERENTIAL/PLATELET
BASOS PCT: 0.3 % (ref 0.0–3.0)
Basophils Absolute: 0 10*3/uL (ref 0.0–0.1)
EOS ABS: 0.3 10*3/uL (ref 0.0–0.7)
Eosinophils Relative: 5.9 % — ABNORMAL HIGH (ref 0.0–5.0)
HCT: 41.3 % (ref 39.0–52.0)
HEMOGLOBIN: 13.8 g/dL (ref 13.0–17.0)
Lymphocytes Relative: 40.4 % (ref 12.0–46.0)
Lymphs Abs: 2.2 10*3/uL (ref 0.7–4.0)
MCHC: 33.4 g/dL (ref 30.0–36.0)
MCV: 89.6 fl (ref 78.0–100.0)
MONO ABS: 0.5 10*3/uL (ref 0.1–1.0)
Monocytes Relative: 9.3 % (ref 3.0–12.0)
NEUTROS ABS: 2.4 10*3/uL (ref 1.4–7.7)
NEUTROS PCT: 44.1 % (ref 43.0–77.0)
Platelets: 265 10*3/uL (ref 150.0–400.0)
RBC: 4.61 Mil/uL (ref 4.22–5.81)
RDW: 14.3 % (ref 11.5–15.5)
WBC: 5.6 10*3/uL (ref 4.0–10.5)

## 2013-11-22 LAB — MICROALBUMIN / CREATININE URINE RATIO
Creatinine,U: 110.4 mg/dL
Microalb Creat Ratio: 0.2 mg/g (ref 0.0–30.0)
Microalb, Ur: 0.2 mg/dL (ref 0.0–1.9)

## 2013-11-22 LAB — LIPID PANEL
CHOLESTEROL: 158 mg/dL (ref 0–200)
HDL: 48.1 mg/dL (ref >39.00)
LDL CALC: 92 mg/dL (ref 0–99)
NonHDL: 109.9
TRIGLYCERIDES: 91 mg/dL (ref 0.0–149.0)
Total CHOL/HDL Ratio: 3
VLDL: 18.2 mg/dL (ref 0.0–40.0)

## 2013-11-22 LAB — HEMOGLOBIN A1C: Hgb A1c MFr Bld: 6.9 % — ABNORMAL HIGH (ref 4.6–6.5)

## 2013-11-22 LAB — TSH: TSH: 3.06 u[IU]/mL (ref 0.35–4.50)

## 2013-11-22 LAB — PSA, MEDICARE: PSA: 0.67 ng/ml (ref 0.10–4.00)

## 2013-11-23 LAB — VARICELLA ZOSTER ABS, IGG/IGM
VARICELLA: 1414 {index} (ref >165)
Varicella IgM: <0.91 index (ref 0.00–0.90)

## 2013-11-27 ENCOUNTER — Encounter: Payer: Self-pay | Admitting: Internal Medicine

## 2013-11-28 NOTE — Telephone Encounter (Signed)
Mailed unread message to pt  

## 2013-12-10 ENCOUNTER — Telehealth: Payer: Self-pay | Admitting: Internal Medicine

## 2013-12-10 DIAGNOSIS — M25579 Pain in unspecified ankle and joints of unspecified foot: Secondary | ICD-10-CM

## 2013-12-10 MED ORDER — IBUPROFEN 800 MG PO TABS: 800.0000 mg | ORAL_TABLET | Freq: Three times a day (TID) | ORAL | Status: DC | PRN

## 2013-12-10 NOTE — Telephone Encounter (Signed)
Pt left vm.  States he is waiting for referral from Dr. Derrel Nip for an ankle specialist for bone spur through [facility].  No referral in. Also asking for 3 month supply of ibuprofen.  States he will be on a cruise and vacation in August so needs 3 month's.

## 2013-12-10 NOTE — Telephone Encounter (Signed)
Please advise as to referral.

## 2013-12-10 NOTE — Telephone Encounter (Signed)
Ok to refill,  Refill sent \ Referral is in process as requested to [facility] ortho Dr Debby Bud

## 2013-12-11 NOTE — Telephone Encounter (Signed)
Left message for patient to return call to office. 

## 2013-12-11 NOTE — Telephone Encounter (Signed)
Pt came into office.  States referral will have to be placed after August 20th.  He will be on vacation until that time. Advised pt referral is in process and rx has been sent.

## 2013-12-28 DIAGNOSIS — M6281 Muscle weakness (generalized): Secondary | ICD-10-CM | POA: Diagnosis not present

## 2013-12-28 DIAGNOSIS — M7660 Achilles tendinitis, unspecified leg: Secondary | ICD-10-CM | POA: Diagnosis not present

## 2014-02-01 ENCOUNTER — Encounter: Payer: Self-pay | Admitting: Internal Medicine

## 2014-02-27 DIAGNOSIS — M79609 Pain in unspecified limb: Secondary | ICD-10-CM | POA: Diagnosis not present

## 2014-02-27 DIAGNOSIS — M25579 Pain in unspecified ankle and joints of unspecified foot: Secondary | ICD-10-CM | POA: Diagnosis not present

## 2014-02-27 DIAGNOSIS — M6520 Calcific tendinitis, unspecified site: Secondary | ICD-10-CM | POA: Diagnosis not present

## 2014-03-21 ENCOUNTER — Telehealth: Payer: Self-pay

## 2014-03-21 ENCOUNTER — Other Ambulatory Visit: Payer: Self-pay | Admitting: *Deleted

## 2014-03-21 MED ORDER — LISINOPRIL 5 MG PO TABS: 5.0000 mg | ORAL_TABLET | Freq: Every day | ORAL | Status: DC

## 2014-03-21 NOTE — Telephone Encounter (Signed)
The patient is hoping to get a refill of his lisinopril sent to the [facility] he uses.

## 2014-04-29 ENCOUNTER — Telehealth: Payer: Self-pay | Admitting: *Deleted

## 2014-04-29 MED ORDER — LOSARTAN POTASSIUM 25 MG PO TABS: 25.0000 mg | ORAL_TABLET | Freq: Every day | ORAL | Status: DC

## 2014-04-29 NOTE — Telephone Encounter (Signed)
Pt called states since he has started taking Lisinopril he has developed a cough.  Further states the pharmacist told him to call his MD if this occurred.  Please advise

## 2014-04-29 NOTE — Telephone Encounter (Signed)
Patient is overdue for follow up on  diabetes,  Please give patient appt for December,  Needs fsating labs prior  Will send alternative medication to pharmacy .  Cough may take 2 months to resolve from lisinopril.

## 2014-04-30 NOTE — Telephone Encounter (Signed)
Left message for patient to return call to office. 

## 2014-04-30 NOTE — Telephone Encounter (Signed)
Lab appointment and follow up made patient voiced understanding of new medication.

## 2014-05-06 ENCOUNTER — Other Ambulatory Visit: Payer: Self-pay | Admitting: Internal Medicine

## 2014-05-06 NOTE — Telephone Encounter (Signed)
Please advise refill? 

## 2014-05-06 NOTE — Telephone Encounter (Signed)
Refill is for 30 days only.  Has not been seen in  6 months so needs labs/office visit prior to any more refills

## 2014-05-08 ENCOUNTER — Telehealth: Payer: Self-pay | Admitting: Cardiovascular Disease

## 2014-05-08 DIAGNOSIS — M7662 Achilles tendinitis, left leg: Secondary | ICD-10-CM | POA: Diagnosis not present

## 2014-05-08 DIAGNOSIS — E119 Type 2 diabetes mellitus without complications: Secondary | ICD-10-CM | POA: Diagnosis not present

## 2014-05-08 DIAGNOSIS — E039 Hypothyroidism, unspecified: Secondary | ICD-10-CM | POA: Diagnosis not present

## 2014-05-08 DIAGNOSIS — I2510 Atherosclerotic heart disease of native coronary artery without angina pectoris: Secondary | ICD-10-CM | POA: Diagnosis not present

## 2014-05-08 DIAGNOSIS — M6528 Calcific tendinitis, other site: Secondary | ICD-10-CM | POA: Diagnosis not present

## 2014-05-08 NOTE — Telephone Encounter (Signed)
Faxed most recent EKG to Amy Martinique PA as requested

## 2014-05-08 NOTE — Telephone Encounter (Signed)
PA from [place] is calling for patient is having foot surgery tomorrow, needs to get an okay from [place]. Please call.

## 2014-05-09 DIAGNOSIS — M7732 Calcaneal spur, left foot: Secondary | ICD-10-CM | POA: Diagnosis not present

## 2014-05-09 DIAGNOSIS — I10 Essential (primary) hypertension: Secondary | ICD-10-CM | POA: Diagnosis not present

## 2014-05-09 DIAGNOSIS — I2510 Atherosclerotic heart disease of native coronary artery without angina pectoris: Secondary | ICD-10-CM | POA: Diagnosis not present

## 2014-05-09 DIAGNOSIS — M6520 Calcific tendinitis, unspecified site: Secondary | ICD-10-CM | POA: Diagnosis not present

## 2014-05-09 DIAGNOSIS — E119 Type 2 diabetes mellitus without complications: Secondary | ICD-10-CM | POA: Diagnosis not present

## 2014-05-09 DIAGNOSIS — Z951 Presence of aortocoronary bypass graft: Secondary | ICD-10-CM | POA: Diagnosis not present

## 2014-05-09 DIAGNOSIS — Z79899 Other long term (current) drug therapy: Secondary | ICD-10-CM | POA: Diagnosis not present

## 2014-05-09 DIAGNOSIS — M899 Disorder of bone, unspecified: Secondary | ICD-10-CM | POA: Diagnosis not present

## 2014-05-09 DIAGNOSIS — M898X9 Other specified disorders of bone, unspecified site: Secondary | ICD-10-CM | POA: Diagnosis not present

## 2014-05-09 DIAGNOSIS — M65272 Calcific tendinitis, left ankle and foot: Secondary | ICD-10-CM | POA: Diagnosis not present

## 2014-05-09 DIAGNOSIS — E039 Hypothyroidism, unspecified: Secondary | ICD-10-CM | POA: Diagnosis not present

## 2014-05-09 DIAGNOSIS — M7662 Achilles tendinitis, left leg: Secondary | ICD-10-CM | POA: Diagnosis not present

## 2014-05-22 ENCOUNTER — Telehealth: Payer: Self-pay | Admitting: *Deleted

## 2014-05-22 DIAGNOSIS — E119 Type 2 diabetes mellitus without complications: Secondary | ICD-10-CM

## 2014-05-22 NOTE — Telephone Encounter (Signed)
What labs and dx?  

## 2014-05-23 ENCOUNTER — Other Ambulatory Visit (INDEPENDENT_AMBULATORY_CARE_PROVIDER_SITE_OTHER): Payer: Medicare Other

## 2014-05-23 DIAGNOSIS — E119 Type 2 diabetes mellitus without complications: Secondary | ICD-10-CM | POA: Diagnosis not present

## 2014-05-23 LAB — COMPREHENSIVE METABOLIC PANEL
ALBUMIN: 4.1 g/dL (ref 3.5–5.2)
ALT: 35 U/L (ref 0–53)
AST: 33 U/L (ref 0–37)
Alkaline Phosphatase: 69 U/L (ref 39–117)
BUN: 22 mg/dL (ref 6–23)
CO2: 25 mEq/L (ref 19–32)
Calcium: 9.4 mg/dL (ref 8.4–10.5)
Chloride: 103 mEq/L (ref 96–112)
Creatinine, Ser: 1.1 mg/dL (ref 0.4–1.5)
GFR: 70.1 mL/min (ref >60.00)
GLUCOSE: 138 mg/dL — AB (ref 70–99)
POTASSIUM: 4.5 meq/L (ref 3.5–5.1)
Sodium: 138 mEq/L (ref 135–145)
Total Bilirubin: 0.7 mg/dL (ref 0.2–1.2)
Total Protein: 7.9 g/dL (ref 6.0–8.3)

## 2014-05-23 LAB — LIPID PANEL
CHOLESTEROL: 168 mg/dL (ref 0–200)
HDL: 42.4 mg/dL (ref >39.00)
LDL CALC: 105 mg/dL — AB (ref 0–99)
NonHDL: 125.6
Total CHOL/HDL Ratio: 4
Triglycerides: 103 mg/dL (ref 0.0–149.0)
VLDL: 20.6 mg/dL (ref 0.0–40.0)

## 2014-05-23 LAB — MICROALBUMIN / CREATININE URINE RATIO
CREATININE, U: 164.1 mg/dL
Microalb Creat Ratio: 0.2 mg/g (ref 0.0–30.0)
Microalb, Ur: 0.4 mg/dL (ref 0.0–1.9)

## 2014-05-23 LAB — HEMOGLOBIN A1C: HEMOGLOBIN A1C: 6.6 % — AB (ref 4.6–6.5)

## 2014-05-26 ENCOUNTER — Encounter: Payer: Self-pay | Admitting: Internal Medicine

## 2014-05-27 ENCOUNTER — Ambulatory Visit (INDEPENDENT_AMBULATORY_CARE_PROVIDER_SITE_OTHER): Payer: Medicare Other | Admitting: Internal Medicine

## 2014-05-27 ENCOUNTER — Encounter: Payer: Self-pay | Admitting: Internal Medicine

## 2014-05-27 VITALS — BP 116/58 | HR 61 | Temp 98.3°F | Resp 14 | Wt 181.5 lb

## 2014-05-27 DIAGNOSIS — I2510 Atherosclerotic heart disease of native coronary artery without angina pectoris: Secondary | ICD-10-CM | POA: Diagnosis not present

## 2014-05-27 DIAGNOSIS — F418 Other specified anxiety disorders: Secondary | ICD-10-CM

## 2014-05-27 DIAGNOSIS — M6790 Unspecified disorder of synovium and tendon, unspecified site: Secondary | ICD-10-CM | POA: Diagnosis not present

## 2014-05-27 DIAGNOSIS — E785 Hyperlipidemia, unspecified: Secondary | ICD-10-CM | POA: Diagnosis not present

## 2014-05-27 DIAGNOSIS — I10 Essential (primary) hypertension: Secondary | ICD-10-CM

## 2014-05-27 DIAGNOSIS — E038 Other specified hypothyroidism: Secondary | ICD-10-CM | POA: Diagnosis not present

## 2014-05-27 DIAGNOSIS — E034 Atrophy of thyroid (acquired): Secondary | ICD-10-CM

## 2014-05-27 DIAGNOSIS — E08351 Diabetes mellitus due to underlying condition with proliferative diabetic retinopathy with macular edema: Secondary | ICD-10-CM | POA: Diagnosis not present

## 2014-05-27 DIAGNOSIS — E083519 Diabetes mellitus due to underlying condition with proliferative diabetic retinopathy with macular edema, unspecified eye: Secondary | ICD-10-CM

## 2014-05-27 LAB — HM DIABETES FOOT EXAM: HM Diabetic Foot Exam: NORMAL

## 2014-05-27 MED ORDER — GLUCOSE BLOOD VI STRP: 1.0000 | ORAL_STRIP | Freq: Every day | Status: DC

## 2014-05-27 NOTE — Patient Instructions (Addendum)
Your diabetes remains under excellent control  And your cholesterol and other labs are also normal.   Please continue your current medications. return in 6 months for follow up on diabetes and make sure you are seeing your eye doctor at least once a year.    Lab Results  Component Value Date   HGBA1C 6.6* 05/23/2014   Lab Results  Component Value Date   CHOL 168 05/23/2014   HDL 42.40 05/23/2014   LDLCALC 105* 05/23/2014   LDLDIRECT 74.0 08/14/2013   TRIG 103.0 05/23/2014   CHOLHDL 4 05/23/2014

## 2014-05-27 NOTE — Progress Notes (Signed)
Patient ID: Brett [name], male   DOB: 04-13-1941, 73 y.o.   MRN: [id number] Patient Active Problem List   Diagnosis Date Noted  . CAD (coronary artery disease) 10/15/2013  . Depression with anxiety 08/07/2012  . Pulmonary nodule seen on imaging study   . H/O renal calculi   . Hyperlipidemia LDL goal <70 01/04/2012  . Routine general medical examination at a health care facility 01/04/2012  . Multiple vessel coronary artery disease 07/09/2011  . Diabetes mellitus with proliferative retinopathy 07/09/2011  . Lower extremity tendinopathy 07/09/2011  . Hypothyroidism 07/09/2011  . Hypertension 07/08/2011    Subjective:  CC:   Chief Complaint  Patient presents with  . Follow-up    diabetic follow up    HPI:   Brett [name] is a 73 y.o. male who presents for   6 month follow up on DM type 2, hyperlipidemia, and hypertension.  He is recovering from ankle surgery that was done on Dec 3rd by [facility].  He states that his surgical pain has resolved, after only 1.5 days.   He feels generally well, is exercising several times per week and checking blood sugars once daily at variable times.  BS have been under 130 fasting and < 150 post prandially.  Denies any recent hypoglyemic events.  Taking his medications as directed. Following a carbohydrate modified diet 6 days per week. Denies numbness, burning and tingling of extremities. Appetite is good.       Past Medical History  Diagnosis Date  . Hypertension   . Hyperlipidemia   . Diabetes mellitus   . Myocardial infarction   . Allergy     resolved after CABG  . Pulmonary nodule seen on imaging study 2012  . H/O renal calculi 2012  . Peyronie's disease   . Impotence of organic origin   . Swelling, mass, or lump in chest   . Unspecified hypothyroidism   . Hematuria, unspecified   . Calculus of ureter   . Coronary artery disease     Past Surgical History  Procedure Laterality Date  . Colonoscopy    . Penile prosthesis implant    .  Cardiac catheterization  2001    [facility] x3 stent  . 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 Coronary artery bypass graft         The following portions of the patient's history were reviewed and updated as appropriate: Allergies, current medications, and problem list.    Review of Systems:   Patient denies headache, fevers, malaise, unintentional weight loss, skin rash, eye pain, sinus congestion and sinus pain, sore throat, dysphagia,  hemoptysis , cough, dyspnea, wheezing, chest pain, palpitations, orthopnea, edema, abdominal pain, nausea, melena, diarrhea, constipation, flank pain, dysuria, hematuria, urinary  Frequency, nocturia, numbness, tingling, seizures,  Focal weakness, Loss of consciousness,  Tremor, insomnia, depression, anxiety, and suicidal ideation.     History   Social History  . Marital Status: Married    Spouse Name: Audelia Acton now deceased    Number of Children: N/A  . Years of Education: N/A   Occupational History  .     Social History Main Topics  . Smoking status: Never Smoker   . Smokeless tobacco: Never Used  . Alcohol Use: 4.2 oz/week    7 Glasses of wine per week  . Drug Use: No  . Sexual Activity: Not Currently   Other Topics Concern  . Not on file  Social History Narrative    Objective:  Filed Vitals:   05/27/14 1453  BP: 116/58  Pulse: 61  Temp: 98.3 F (36.8 C)  Resp: 14     General appearance: alert, cooperative and appears stated age Ears: normal TM's and external ear canals both ears Throat: lips, mucosa, and tongue normal; teeth and gums normal Neck: no adenopathy, no carotid bruit, supple, symmetrical, trachea midline and thyroid not enlarged, symmetric, no tenderness/mass/nodules Back: symmetric, no curvature. ROM normal. No CVA tenderness. Lungs: clear to auscultation bilaterally Heart: regular rate and rhythm, S1, S2 normal, no murmur, click, rub or gallop Abdomen: soft,  non-tender; bowel sounds normal; no masses,  no organomegaly Pulses: 2+ and symmetric Skin: Skin color, texture, turgor normal. No rashes or lesions Lymph nodes: Cervical, supraclavicular, and axillary nodes normal.  Assessment and Plan:  Hypertension Well controlled on current regimen. Renal function stable, no changes today. Lab Results  Component Value Date   CREATININE 1.1 05/23/2014   Lab Results  Component Value Date   NA 138 05/23/2014   K 4.5 05/23/2014   CL 103 05/23/2014   CO2 25 05/23/2014   Lab Results  Component Value Date   MICROALBUR 0.4 05/23/2014     Hypothyroidism Thyroid function is WNL on current dose.  No current changes needed.   Lab Results  Component Value Date   TSH 3.06 11/22/2013     Lower extremity tendinopathy S/p surgical correction by Pasty Spillers.   Hyperlipidemia LDL goal <70 Well controlled on current statin therapy.   Liver enzymes are normal , no changes today.  Lab Results  Component Value Date   CHOL 168 05/23/2014   HDL 42.40 05/23/2014   LDLCALC 105* 05/23/2014   LDLDIRECT 74.0 08/14/2013   TRIG 103.0 05/23/2014   CHOLHDL 4 05/23/2014   Lab Results  Component Value Date   ALT 35 05/23/2014   AST 33 05/23/2014   ALKPHOS 69 05/23/2014   BILITOT 0.7 05/23/2014       Depression with anxiety Secondary to loss of wife .  Symptoms are now resolved and he has a girlfriend.   Diabetes mellitus with proliferative retinopathy Taking glipizide 5 mg daily in the AM  .  well-controlled on current medications.  hemoglobin A1c has been consistently at or  less than 7.0 . Patient is up-to-date on eye exams and foot exam is normal today.. Patient is tolerating statin therapy for CAD risk reduction and on ACE/ARB for reduction in proteinuria.   Lab Results  Component Value Date   HGBA1C 6.6* 05/23/2014   Lab Results  Component Value Date   MICROALBUR 0.4 05/23/2014      Updated Medication List Outpatient Encounter  Prescriptions as of 05/27/2014  Medication Sig  . aspirin 81 MG tablet Take 1 tablet (81 mg total) by mouth daily.  Marland Kitchen atorvastatin (LIPITOR) 40 MG tablet Take 1 tablet (40 mg total) by mouth daily.  Marland Kitchen glipiZIDE (GLUCOTROL) 5 MG tablet Take 5 mg by mouth daily before breakfast.  . glucose blood test strip 1 each by Other route daily. Dx. 250.00  . ibuprofen (ADVIL,MOTRIN) 800 MG tablet TAKE 1 TABLET (800 MG TOTAL) BY MOUTH EVERY 8 (EIGHT) HOURS AS NEEDED FOR MODERATE PAIN.  Marland Kitchen levothyroxine (SYNTHROID, LEVOTHROID) 112 MCG tablet TAKE 1 TABLET BY MOUTH EVERY DAY  . lisinopril (PRINIVIL,ZESTRIL) 5 MG tablet Take 1 tablet (5 mg total) by mouth daily.  Marland Kitchen losartan (COZAAR) 25 MG tablet Take 1 tablet (25 mg total) by mouth  daily.  . metoprolol tartrate (LOPRESSOR) 25 MG tablet Take 25 mg by mouth daily.  Marland Kitchen venlafaxine (EFFEXOR) 75 MG tablet Take 75 mg by mouth daily.  . [DISCONTINUED] ALPRAZolam (XANAX) 0.5 MG tablet TAKE 1 TABLET BY MOUTH AT BEDTIME AS NEEDED FOR SLEEP  . [DISCONTINUED] ciprofloxacin (CIPRO) 500 MG tablet Take 0.5 tablets (250 mg total) by mouth 2 (two) times daily.  . [DISCONTINUED] diphenhydrAMINE (SOMINEX) 25 MG tablet Take 25 mg by mouth at bedtime as needed.  . [DISCONTINUED] glucose blood (PRECISION XTRA TEST STRIPS) test strip Test blood sugars once daily .  Use one test strip each time blood sugar is checked.  . [DISCONTINUED] glucose blood test strip Please use one test strip each time sugar is to be tested.   Dx. 250.00  . [DISCONTINUED] metroNIDAZOLE (FLAGYL) 500 MG tablet Take 1 tablet (500 mg total) by mouth 3 (three) times daily.  . [DISCONTINUED] promethazine (PHENERGAN) 12.5 MG suppository Place 1 suppository (12.5 mg total) rectally every 6 (six) hours as needed for nausea or vomiting.  . [DISCONTINUED] promethazine (PHENERGAN) 25 MG tablet Take 1 tablet (25 mg total) by mouth every 8 (eight) hours as needed for nausea or vomiting.     Orders Placed This Encounter   Procedures  . HM DIABETES FOOT EXAM    No Follow-up on file.

## 2014-05-27 NOTE — Progress Notes (Signed)
Pre visit review using our clinic review tool, if applicable. No additional management support is needed unless otherwise documented below in the visit note. 

## 2014-05-30 NOTE — Assessment & Plan Note (Signed)
Thyroid function is WNL on current dose.  No current changes needed.   Lab Results  Component Value Date   TSH 3.06 11/22/2013

## 2014-05-30 NOTE — Assessment & Plan Note (Signed)
Well controlled on current regimen. Renal function stable, no changes today. Lab Results  Component Value Date   CREATININE 1.1 05/23/2014   Lab Results  Component Value Date   NA 138 05/23/2014   K 4.5 05/23/2014   CL 103 05/23/2014   CO2 25 05/23/2014   Lab Results  Component Value Date   MICROALBUR 0.4 05/23/2014

## 2014-05-30 NOTE — Assessment & Plan Note (Signed)
Taking glipizide 5 mg daily in the AM  .  well-controlled on current medications.  hemoglobin A1c has been consistently at or  less than 7.0 . Patient is up-to-date on eye exams and foot exam is normal today.. Patient is tolerating statin therapy for CAD risk reduction and on ACE/ARB for reduction in proteinuria.   Lab Results  Component Value Date   HGBA1C 6.6* 05/23/2014   Lab Results  Component Value Date   MICROALBUR 0.4 05/23/2014

## 2014-05-30 NOTE — Assessment & Plan Note (Signed)
Secondary to loss of wife .  Symptoms are now resolved and he has a girlfriend.

## 2014-05-30 NOTE — Assessment & Plan Note (Signed)
S/p surgical correction by Pasty Spillers.

## 2014-05-30 NOTE — Assessment & Plan Note (Signed)
Well controlled on current statin therapy.   Liver enzymes are normal , no changes today.  Lab Results  Component Value Date   CHOL 168 05/23/2014   HDL 42.40 05/23/2014   LDLCALC 105* 05/23/2014   LDLDIRECT 74.0 08/14/2013   TRIG 103.0 05/23/2014   CHOLHDL 4 05/23/2014   Lab Results  Component Value Date   ALT 35 05/23/2014   AST 33 05/23/2014   ALKPHOS 69 05/23/2014   BILITOT 0.7 05/23/2014

## 2014-06-25 ENCOUNTER — Other Ambulatory Visit: Payer: Self-pay | Admitting: *Deleted

## 2014-06-25 MED ORDER — GLIPIZIDE 5 MG PO TABS: 5.0000 mg | ORAL_TABLET | Freq: Every day | ORAL | Status: DC

## 2014-10-19 LAB — HM DIABETES EYE EXAM

## 2014-10-20 ENCOUNTER — Other Ambulatory Visit: Payer: Self-pay | Admitting: Internal Medicine

## 2014-10-24 ENCOUNTER — Ambulatory Visit (INDEPENDENT_AMBULATORY_CARE_PROVIDER_SITE_OTHER): Payer: PPO | Admitting: Cardiovascular Disease

## 2014-10-24 ENCOUNTER — Encounter: Payer: Self-pay | Admitting: Cardiovascular Disease

## 2014-10-24 VITALS — BP 104/58 | HR 54 | Ht 72.0 in | Wt 187.0 lb

## 2014-10-24 DIAGNOSIS — E785 Hyperlipidemia, unspecified: Secondary | ICD-10-CM

## 2014-10-24 DIAGNOSIS — I25709 Atherosclerosis of coronary artery bypass graft(s), unspecified, with unspecified angina pectoris: Secondary | ICD-10-CM

## 2014-10-24 MED ORDER — ATORVASTATIN CALCIUM 80 MG PO TABS: 80.0000 mg | ORAL_TABLET | Freq: Every day | ORAL | Status: DC

## 2014-10-24 NOTE — Patient Instructions (Addendum)
Medication Instructions:  Please increase your atorvastatin to 80 mg once daily  Labwork: Lipid, Liver, BMET in 3 months  Testing/Procedures: None  Follow-Up: Your physician wants you to follow-up in: 1 year  You will receive a reminder letter in the mail two months in advance.  If you don't receive a letter, please call our office to schedule the follow-up appointment.

## 2014-10-24 NOTE — Progress Notes (Signed)
Cardiology Office Note   Date:  10/24/2014   ID:  Brett [name], Brett [name] Oct 19, 1940, MRN [id number]  PCP:  Crecencio Mc, MD  Cardiologist:   Thayer Headings, MD   Chief Complaint  Patient presents with  . Coronary Artery Disease    12 month follow up. Meds reviewed by the patient verbally. "doing well."    1. Coronary artery disease, CABG at [facility]  2. Hyperlipidemia 3. Diabetes mellitus 4. Hypothyroidism   History of Present Illness:  Oct 15, 2013:  Brett [name] is a 74 yo with hx of CAD , CABG in 2006. He presents today for transfer of care. He was formerly seen by [facility] . He's here to reestablish care with a cardiologist. He is moved from [facility] to [place]. He exercises on a regular basis. No CP , no dyspnea. He is retired from the [employer])  Oct 24, 2014:  Brett [name] is a 74 y.o. male who presents for follow-up of his coronary artery disease.  No CP , no dyspnea, exercising regularly. Had some foot surgery in Dec so he is not quite as active as previously.   Past Medical History  Diagnosis Date  . Hypertension   . Hyperlipidemia   . Diabetes mellitus   . Myocardial infarction   . Allergy     resolved after CABG  . Pulmonary nodule seen on imaging study 2012  . H/O renal calculi 2012  . Peyronie's disease   . Impotence of organic origin   . Swelling, mass, or lump in chest   . Unspecified hypothyroidism   . Hematuria, unspecified   . Calculus of ureter   . Coronary artery disease     Past Surgical History  Procedure Laterality Date  . Colonoscopy    . Penile prosthesis implant    . Cardiac catheterization  2001    [facility] x3 stent  . 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 Coronary artery bypass graft       Current Outpatient Prescriptions  Medication Sig Dispense Refill  . aspirin 81 MG tablet Take 1 tablet (81 mg  total) by mouth daily. 30 tablet 11  . atorvastatin (LIPITOR) 40 MG tablet Take 1 tablet (40 mg total) by mouth daily. 90 tablet 3  . glipiZIDE (GLUCOTROL) 5 MG tablet Take 1 tablet (5 mg total) by mouth daily before breakfast. 30 tablet 5  . glucose blood test strip 1 each by Other route daily. Dx. 250.00 100 each 2  . ibuprofen (ADVIL,MOTRIN) 800 MG tablet TAKE 1 TABLET (800 MG TOTAL) BY MOUTH EVERY 8 (EIGHT) HOURS AS NEEDED FOR MODERATE PAIN. 90 tablet 0  . levothyroxine (SYNTHROID, LEVOTHROID) 112 MCG tablet TAKE 1 TABLET BY MOUTH EVERY DAY 90 tablet 3  . losartan (COZAAR) 25 MG tablet TAKE 1 TABLET (25 MG TOTAL) BY MOUTH DAILY. 90 tablet 1  . metoprolol tartrate (LOPRESSOR) 25 MG tablet Take 25 mg by mouth daily.    Marland Kitchen venlafaxine (EFFEXOR) 75 MG tablet Take 75 mg by mouth daily.     No current facility-administered medications for this visit.    Allergies:   Review of patient's allergies indicates no known allergies.    Social History:  The patient  reports that he has never smoked. He has never used smokeless tobacco. He reports that he drinks about 4.2 oz of alcohol per week.  He reports that he does not use illicit drugs.   Family History:  The patient's family history includes AAA (abdominal aortic aneurysm) in his mother; Heart disease (age of onset: 58) in his father; Hyperlipidemia in his mother.    ROS:  Please see the history of present illness.    Review of Systems: Constitutional:  denies fever, chills, diaphoresis, appetite change and fatigue.  HEENT: denies photophobia, eye pain, redness, hearing loss, ear pain, congestion, sore throat, rhinorrhea, sneezing, neck pain, neck stiffness and tinnitus.  Respiratory: denies SOB, DOE, cough, chest tightness, and wheezing.  Cardiovascular: denies chest pain, palpitations and leg swelling.  Gastrointestinal: denies nausea, vomiting, abdominal pain, diarrhea, constipation, blood in stool.  Genitourinary: denies dysuria, urgency,  frequency, hematuria, flank pain and difficulty urinating.  Musculoskeletal: denies  myalgias, back pain, joint swelling, arthralgias and gait problem.   Skin: denies pallor, rash and wound.  Neurological: denies dizziness, seizures, syncope, weakness, light-headedness, numbness and headaches.   Hematological: denies adenopathy, easy bruising, personal or family bleeding history.  Psychiatric/ Behavioral: denies suicidal ideation, mood changes, confusion, nervousness, sleep disturbance and agitation.       All other systems are reviewed and negative.    PHYSICAL EXAM: VS:  BP 104/58 mmHg  Pulse 54  Ht 6' (1.829 m)  Wt 84.823 kg (187 lb)  BMI 25.36 kg/m2 , BMI Body mass index is 25.36 kg/(m^2). GEN: Well nourished, well developed, in no acute distress HEENT: normal Neck: no JVD, carotid bruits, or masses Cardiac: RRR; no murmurs, rubs, or gallops,no edema  Respiratory:  clear to auscultation bilaterally, normal work of breathing GI: soft, nontender, nondistended, + BS MS: no deformity or atrophy Skin: warm and dry, no rash Neuro:  Strength and sensation are intact Psych: normal   EKG:  EKG is ordered today. The ekg ordered today demonstrates sinus bradycardia at 54.  NS ST changes in ant. Lat leads.  No changes from previous tracing    Recent Labs: 11/22/2013: Hemoglobin 13.8; Platelets 265.0; TSH 3.06 05/23/2014: ALT 35; BUN 22; Creatinine 1.1; Potassium 4.5; Sodium 138    Lipid Panel    Component Value Date/Time   CHOL 168 05/23/2014 0843   CHOL 201* 10/05/2011 1053   TRIG 103.0 05/23/2014 0843   HDL 42.40 05/23/2014 0843   HDL 58 10/05/2011 1053   CHOLHDL 4 05/23/2014 0843   VLDL 20.6 05/23/2014 0843   LDLCALC 105* 05/23/2014 0843   LDLCALC 116* 10/05/2011 1053   LDLDIRECT 74.0 08/14/2013 1639      Wt Readings from Last 3 Encounters:  10/24/14 84.823 kg (187 lb)  05/27/14 82.328 kg (181 lb 8 oz)  11/14/13 84.142 kg (185 lb 8 oz)      Other studies  Reviewed: Additional studies/ records that were reviewed today include: . Review of the above records demonstrates:    ASSESSMENT AND PLAN:  1. Coronary artery disease, CABG at [facility]  -he's doing very well. He's not had any episodes of chest pain or shortness of breath. Continue current medications. 2. Hyperlipidemia - his last LDL was above 100. We will increase his atorvastatin to 80 mg a day. We will check fasting lipids, liver enzymes, and basic medical profile in 3 months. I'll see him back in one year. 3. Diabetes mellitus - further management per Dr. Derrel Nip 4. Hypothyroidism   Current medicines are reviewed at length with the patient today.  The patient does not have concerns regarding medicines.  The following changes have been made:  See above   Labs/ tests ordered today include:  Orders Placed This Encounter  Procedures  . EKG 12-Lead     Disposition:   FU with me in 1 year     Dat Derksen, Wonda Cheng, MD  10/24/2014 10:47 AM    [address], [place], [place]  [address] Phone: [phone]; Fax: [phone]   [facility]  [address] [place], [place]  [address] [phone]    Fax [phone]

## 2014-10-27 ENCOUNTER — Other Ambulatory Visit: Payer: Self-pay | Admitting: Internal Medicine

## 2014-11-08 ENCOUNTER — Other Ambulatory Visit: Payer: Self-pay | Admitting: Internal Medicine

## 2014-12-02 ENCOUNTER — Other Ambulatory Visit: Payer: Self-pay | Admitting: *Deleted

## 2014-12-02 MED ORDER — GLIPIZIDE 5 MG PO TABS: 5.0000 mg | ORAL_TABLET | Freq: Every day | ORAL | Status: DC

## 2014-12-07 ENCOUNTER — Other Ambulatory Visit: Payer: Self-pay | Admitting: Internal Medicine

## 2014-12-09 ENCOUNTER — Other Ambulatory Visit: Payer: Self-pay | Admitting: Internal Medicine

## 2014-12-10 ENCOUNTER — Other Ambulatory Visit: Payer: Self-pay | Admitting: Internal Medicine

## 2014-12-10 NOTE — Telephone Encounter (Signed)
Last OV 12.21.15.  Please advise refill

## 2014-12-11 NOTE — Telephone Encounter (Signed)
Sent mychart on need for labs

## 2014-12-19 ENCOUNTER — Ambulatory Visit (INDEPENDENT_AMBULATORY_CARE_PROVIDER_SITE_OTHER): Payer: PPO | Admitting: Internal Medicine

## 2014-12-19 ENCOUNTER — Encounter: Payer: Self-pay | Admitting: Internal Medicine

## 2014-12-19 VITALS — BP 108/60 | HR 53 | Temp 98.5°F | Resp 12 | Ht 72.0 in | Wt 188.0 lb

## 2014-12-19 DIAGNOSIS — R351 Nocturia: Secondary | ICD-10-CM

## 2014-12-19 DIAGNOSIS — E034 Atrophy of thyroid (acquired): Secondary | ICD-10-CM | POA: Diagnosis not present

## 2014-12-19 DIAGNOSIS — E038 Other specified hypothyroidism: Secondary | ICD-10-CM

## 2014-12-19 DIAGNOSIS — E119 Type 2 diabetes mellitus without complications: Secondary | ICD-10-CM | POA: Diagnosis not present

## 2014-12-19 DIAGNOSIS — N401 Enlarged prostate with lower urinary tract symptoms: Secondary | ICD-10-CM

## 2014-12-19 DIAGNOSIS — E08359 Diabetes mellitus due to underlying condition with proliferative diabetic retinopathy without macular edema: Secondary | ICD-10-CM

## 2014-12-19 DIAGNOSIS — E083599 Diabetes mellitus due to underlying condition with proliferative diabetic retinopathy without macular edema, unspecified eye: Secondary | ICD-10-CM

## 2014-12-19 DIAGNOSIS — M65279 Calcific tendinitis, unspecified ankle and foot: Secondary | ICD-10-CM

## 2014-12-19 LAB — COMPREHENSIVE METABOLIC PANEL
ALT: 26 U/L (ref 0–53)
AST: 38 U/L — AB (ref 0–37)
Albumin: 4.4 g/dL (ref 3.5–5.2)
Alkaline Phosphatase: 72 U/L (ref 39–117)
BILIRUBIN TOTAL: 0.5 mg/dL (ref 0.2–1.2)
BUN: 21 mg/dL (ref 6–23)
CALCIUM: 9.6 mg/dL (ref 8.4–10.5)
CHLORIDE: 104 meq/L (ref 96–112)
CO2: 30 meq/L (ref 19–32)
Creatinine, Ser: 1.09 mg/dL (ref 0.40–1.50)
GFR: 70.73 mL/min (ref >60.00)
GLUCOSE: 87 mg/dL (ref 70–99)
Potassium: 4.2 mEq/L (ref 3.5–5.1)
Sodium: 139 mEq/L (ref 135–145)
TOTAL PROTEIN: 7.8 g/dL (ref 6.0–8.3)

## 2014-12-19 LAB — URINALYSIS, ROUTINE W REFLEX MICROSCOPIC
Bilirubin Urine: NEGATIVE
HGB URINE DIPSTICK: NEGATIVE
KETONES UR: NEGATIVE
Leukocytes, UA: NEGATIVE
Nitrite: NEGATIVE
PH: 6 (ref 5.0–8.0)
RBC / HPF: NONE SEEN (ref 0–?)
Specific Gravity, Urine: 1.02 (ref 1.000–1.030)
Total Protein, Urine: NEGATIVE
UROBILINOGEN UA: 1 (ref 0.0–1.0)
Urine Glucose: NEGATIVE

## 2014-12-19 LAB — MICROALBUMIN / CREATININE URINE RATIO
Creatinine,U: 138.4 mg/dL
MICROALB/CREAT RATIO: 0.5 mg/g (ref 0.0–30.0)
Microalb, Ur: <0.7 mg/dL (ref 0.0–1.9)

## 2014-12-19 LAB — HEMOGLOBIN A1C: HEMOGLOBIN A1C: 6.4 % (ref 4.6–6.5)

## 2014-12-19 LAB — TSH: TSH: 2.54 u[IU]/mL (ref 0.35–4.50)

## 2014-12-19 LAB — HM DIABETES FOOT EXAM: HM DIABETIC FOOT EXAM: NORMAL

## 2014-12-19 MED ORDER — IBUPROFEN 800 MG PO TABS: ORAL_TABLET | ORAL | Status: DC

## 2014-12-19 NOTE — Patient Instructions (Signed)
I am referring you to [facility] for evaluation of your prostate, and to Dr Job Founds for your colonoscopy  Consider seeing Dr Debby Bud for your persistent Achilles heel pain and for removal of that suture that is causing irritation

## 2014-12-19 NOTE — Progress Notes (Signed)
Subjective:  Patient ID: Brett [name], male    DOB: 1941/06/06  Age: 74 y.o. MRN: [id number]  CC: The primary encounter diagnosis was Diabetes mellitus without complication. 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  HPI Brett [name] presents for DIABETES follow up  Nocturia  x 3-4 small to moderate amounts ,some hesitation with voiding ,  Has [name] u[facility] years ago when he lived in [place] .  watsn to see Dr Brett [name].   Sugars in the  120's no lows,  No foot issues,  Last eye exam May at [facility]    Left Achilles tendon surgery in December,  For calcific tenonitis  Tendon is Still very tender to tocuh and has a protruding suture at the base of the tendon which was not addressed at follow up.  He has an appt with Brett [name] but  not until December.  Using motrin 800 mg once daily     Outpatient Prescriptions Prior to Visit  Medication Sig Dispense Refill  . aspirin 81 MG tablet Take 1 tablet (81 mg total) by mouth daily. 30 tablet 11  . atorvastatin (LIPITOR) 80 MG tablet Take 1 tablet (80 mg total) by mouth daily. 90 tablet 3  . glipiZIDE (GLUCOTROL) 5 MG tablet TAKE 1 TABLET (5 MG TOTAL) BY MOUTH DAILY BEFORE BREAKFAST. 30 tablet 0  . glucose blood test strip 1 each by Other route daily. Dx. 250.00 100 each 2  . levothyroxine (SYNTHROID, LEVOTHROID) 112 MCG tablet TAKE 1 TABLET BY MOUTH EVERY DAY 30 tablet 0  . losartan (COZAAR) 25 MG tablet TAKE 1 TABLET (25 MG TOTAL) BY MOUTH DAILY. 90 tablet 0  . metoprolol tartrate (LOPRESSOR) 25 MG tablet Take 25 mg by mouth daily.    Marland Kitchen venlafaxine (EFFEXOR) 75 MG tablet Take 75 mg by mouth daily.    Marland Kitchen ibuprofen (ADVIL,MOTRIN) 800 MG tablet TAKE 1 TABLET (800 MG TOTAL) BY  MOUTH EVERY 8 (EIGHT) HOURS AS NEEDED FOR MODERATE PAIN. 90 tablet 0  . levothyroxine (SYNTHROID, LEVOTHROID) 112 MCG tablet TAKE 1 TABLET BY MOUTH EVERY DAY 30 tablet 0   No facility-administered medications prior to visit.    Review of Systems;  Patient denies headache, fevers, malaise, unintentional weight loss, skin rash, eye pain, sinus congestion and sinus pain, sore throat, dysphagia,  hemoptysis , cough, dyspnea, wheezing, chest pain, palpitations, orthopnea, edema, abdominal pain, nausea, melena, diarrhea, constipation, flank pain, dysuria, hematuria, urinary  Frequency, nocturia, numbness, tingling, seizures,  Focal weakness, Loss of consciousness,  Tremor, insomnia, depression, anxiety, and suicidal ideation.      Objective:  BP 108/60 mmHg  Pulse 53  Temp(Src) 98.5 F (36.9 C) (Oral)  Resp 12  Ht 6' (1.829 m)  Wt 188 lb (85.276 kg)  BMI 25.49 kg/m2  SpO2 95%  BP Readings from Last 3 Encounters:  12/19/14 108/60  10/24/14 104/58  05/27/14 116/58    Wt Readings from Last 3 Encounters:  12/19/14 188 lb (85.276 kg)  10/24/14 187 lb (84.823 kg)  05/27/14 181 lb 8 oz (82.328 kg)    General appearance: alert, cooperative and appears stated age Ears: normal TM's and external ear canals both ears Throat: lips, mucosa, and tongue normal; teeth and gums normal Neck: no adenopathy, no carotid bruit, supple, symmetrical, trachea midline  and thyroid not enlarged, symmetric, no tenderness/mass/nodules Back: symmetric, no curvature. ROM normal. No CVA tenderness. Lungs: clear to auscultation bilaterally Heart: regular rate and rhythm, S1, S2 normal, no murmur, click, rub or gallop Abdomen: soft, non-tender; bowel sounds normal; no masses,  no organomegaly Pulses: 2+ and symmetric Skin: Skin color, texture, turgor normal. No rashes or lesions Lymph nodes: Cervical, supraclavicular, and axillary nodes normal.  Lab Results  Component Value Date   HGBA1C 6.4 12/19/2014    HGBA1C 6.6* 05/23/2014   HGBA1C 6.9* 11/22/2013    Lab Results  Component Value Date   CREATININE 1.09 12/19/2014   CREATININE 1.1 05/23/2014   CREATININE 1.0 11/22/2013    Lab Results  Component Value Date   WBC 5.6 11/22/2013   HGB 13.8 11/22/2013   HCT 41.3 11/22/2013   PLT 265.0 11/22/2013   GLUCOSE 87 12/19/2014   CHOL 150 12/20/2014   TRIG 229.0* 12/20/2014   HDL 47.40 12/20/2014   LDLDIRECT 86.0 12/20/2014   LDLCALC 105* 05/23/2014   ALT 26 12/19/2014   AST 38* 12/19/2014   NA 139 12/19/2014   K 4.2 12/19/2014   CL 104 12/19/2014   CREATININE 1.09 12/19/2014   BUN 21 12/19/2014   CO2 30 12/19/2014   TSH 2.54 12/19/2014   PSA 0.67 11/22/2013   HGBA1C 6.4 12/19/2014   MICROALBUR <0.7 12/19/2014    No results found.  Assessment & Plan:   Problem List Items Addressed This Visit      Unprioritized   Diabetes mellitus with proliferative retinopathy    Taking glipizide 5 mg daily in the AM  .  well-controlled on current medications.  hemoglobin A1c has been consistently at or  less than 7.0 . Patient is up-to-date on eye exams and foot exam is normal today.. Patient is tolerating statin therapy for CAD risk reduction and on ACE/ARB for reduction in proteinuria.   Lab Results  Component Value Date   HGBA1C 6.4 12/19/2014   Lab Results  Component Value Date   MICROALBUR <0.7 12/19/2014           Calcific tendonitis of ankle or foot    adviwsd to follow up with Brett [name] now due to persitent swelling  Tenderness and retained suture which may be a source of infection       Hypothyroidism    Thyroid function is WNL on current dose.  No current changes needed.   Lab Results  Component Value Date   TSH 2.54 12/19/2014         Relevant Orders   TSH (Completed)   Benign prostatic hypertrophy (BPH) with nocturia    With chronic nocturia and hesitation.. Referral to [facility]  For evaluation with ultrasound and treatment .He is overdue for PSA  because he has not scheduled hs annula wellness exam  Lab Results  Component Value Date   PSA 0.67 11/22/2013   PSA 0.51 01/04/2012   PSA 0.53 01/04/2012         Relevant Orders   Ambulatory referral to Urology    Other Visit Diagnoses    Diabetes mellitus without complication    -  Primary    Relevant Orders    Microalbumin / creatinine urine ratio (Completed)    Hemoglobin A1c (Completed)    Comp Met (CMET) (Completed)    Lipid panel (Completed)    Nocturia        Relevant Orders    Urinalysis, Routine w reflex microscopic (Completed)    Urinalysis, dipstick  only       I am having Mr. Dungan maintain his aspirin, venlafaxine, metoprolol tartrate, glucose blood, atorvastatin, losartan, levothyroxine, glipiZIDE, and ibuprofen.  Meds ordered this encounter  Medications  . ibuprofen (ADVIL,MOTRIN) 800 MG tablet    Sig: TAKE 1 TABLET (800 MG TOTAL) BY MOUTH EVERY 8 (EIGHT) HOURS AS NEEDED FOR MODERATE PAIN.    Dispense:  90 tablet    Refill:  1    Medications Discontinued During This Encounter  Medication Reason  . levothyroxine (SYNTHROID, LEVOTHROID) 898 MCG tablet Duplicate  . ibuprofen (ADVIL,MOTRIN) 800 MG tablet Reorder    A total of 25 minutes of face to face time was spent with patient more than half of which was spent in counselling and coordination of care   Follow-up: No Follow-up on file.   Crecencio Mc, MD

## 2014-12-20 ENCOUNTER — Other Ambulatory Visit (INDEPENDENT_AMBULATORY_CARE_PROVIDER_SITE_OTHER): Payer: PPO

## 2014-12-20 DIAGNOSIS — E119 Type 2 diabetes mellitus without complications: Secondary | ICD-10-CM

## 2014-12-20 LAB — LIPID PANEL
Cholesterol: 150 mg/dL (ref 0–200)
HDL: 47.4 mg/dL (ref >39.00)
NONHDL: 102.6
Total CHOL/HDL Ratio: 3
Triglycerides: 229 mg/dL — ABNORMAL HIGH (ref 0.0–149.0)
VLDL: 45.8 mg/dL — ABNORMAL HIGH (ref 0.0–40.0)

## 2014-12-20 LAB — LDL CHOLESTEROL, DIRECT: Direct LDL: 86 mg/dL

## 2014-12-22 ENCOUNTER — Encounter: Payer: Self-pay | Admitting: Internal Medicine

## 2014-12-22 DIAGNOSIS — R351 Nocturia: Secondary | ICD-10-CM

## 2014-12-22 DIAGNOSIS — N401 Enlarged prostate with lower urinary tract symptoms: Secondary | ICD-10-CM | POA: Insufficient documentation

## 2014-12-22 NOTE — Assessment & Plan Note (Signed)
Thyroid function is WNL on current dose.  No current changes needed.   Lab Results  Component Value Date   TSH 2.54 12/19/2014

## 2014-12-22 NOTE — Assessment & Plan Note (Signed)
Taking glipizide 5 mg daily in the AM  .  well-controlled on current medications.  hemoglobin A1c has been consistently at or  less than 7.0 . Patient is up-to-date on eye exams and foot exam is normal today.. Patient is tolerating statin therapy for CAD risk reduction and on ACE/ARB for reduction in proteinuria.   Lab Results  Component Value Date   HGBA1C 6.4 12/19/2014   Lab Results  Component Value Date   MICROALBUR <0.7 12/19/2014

## 2014-12-22 NOTE — Assessment & Plan Note (Signed)
adviwsd to follow up with [facility] now due to persitent swelling  Tenderness and retained suture which may be a source of infection

## 2014-12-22 NOTE — Assessment & Plan Note (Addendum)
With chronic nocturia and hesitation.. Referral to [facility]  For evaluation with ultrasound and treatment .He is overdue for PSA because he has not scheduled hs annula wellness exam  Lab Results  Component Value Date   PSA 0.67 11/22/2013   PSA 0.51 01/04/2012   PSA 0.53 01/04/2012

## 2014-12-27 ENCOUNTER — Telehealth: Payer: Self-pay | Admitting: Internal Medicine

## 2014-12-27 ENCOUNTER — Other Ambulatory Visit: Payer: Self-pay | Admitting: Internal Medicine

## 2014-12-27 NOTE — Telephone Encounter (Signed)
Pt came in wanting to know the results of his lab results. Please advise pt.Marland Kitchen

## 2014-12-27 NOTE — Telephone Encounter (Signed)
Spoke with pt, made aware results were sent through his [facility].  Verbalized understanding

## 2015-01-06 ENCOUNTER — Other Ambulatory Visit: Payer: Self-pay | Admitting: Internal Medicine

## 2015-01-08 NOTE — Telephone Encounter (Signed)
Mailed unread my chart message to patient

## 2015-01-17 ENCOUNTER — Other Ambulatory Visit (INDEPENDENT_AMBULATORY_CARE_PROVIDER_SITE_OTHER): Payer: PPO | Admitting: *Deleted

## 2015-01-17 DIAGNOSIS — I25709 Atherosclerosis of coronary artery bypass graft(s), unspecified, with unspecified angina pectoris: Secondary | ICD-10-CM | POA: Diagnosis not present

## 2015-01-17 DIAGNOSIS — E785 Hyperlipidemia, unspecified: Secondary | ICD-10-CM

## 2015-01-18 LAB — BASIC METABOLIC PANEL
BUN/Creatinine Ratio: 22 (ref 10–22)
BUN: 22 mg/dL (ref 8–27)
CALCIUM: 8.8 mg/dL (ref 8.6–10.2)
CHLORIDE: 102 mmol/L (ref 97–108)
CO2: 21 mmol/L (ref 18–29)
Creatinine, Ser: 0.98 mg/dL (ref 0.76–1.27)
GFR calc Af Amer: 89 mL/min/{1.73_m2} (ref >59)
GFR calc non Af Amer: 77 mL/min/{1.73_m2} (ref >59)
GLUCOSE: 119 mg/dL — AB (ref 65–99)
Potassium: 4.5 mmol/L (ref 3.5–5.2)
Sodium: 139 mmol/L (ref 134–144)

## 2015-01-18 LAB — HEPATIC FUNCTION PANEL
ALT: 26 IU/L (ref 0–44)
AST: 32 IU/L (ref 0–40)
Albumin: 4.3 g/dL (ref 3.5–4.8)
Alkaline Phosphatase: 78 IU/L (ref 39–117)
Bilirubin Total: 0.6 mg/dL (ref 0.0–1.2)
Bilirubin, Direct: 0.16 mg/dL (ref 0.00–0.40)
TOTAL PROTEIN: 7.1 g/dL (ref 6.0–8.5)

## 2015-01-18 LAB — LIPID PANEL
CHOLESTEROL TOTAL: 150 mg/dL (ref 100–199)
Chol/HDL Ratio: 2.9 ratio units (ref 0.0–5.0)
HDL: 51 mg/dL (ref >39)
LDL Calculated: 83 mg/dL (ref 0–99)
TRIGLYCERIDES: 81 mg/dL (ref 0–149)
VLDL Cholesterol Cal: 16 mg/dL (ref 5–40)

## 2015-01-22 ENCOUNTER — Telehealth: Payer: Self-pay | Admitting: Cardiovascular Disease

## 2015-01-22 NOTE — Telephone Encounter (Signed)
New message ° ° ° ° ° ° °Calling to get lab results °

## 2015-01-22 NOTE — Telephone Encounter (Signed)
Follow up      Pt said Brett [name] call him a few days ago to give him lab results.  Pt leaves tomorrow to go out of town. Please call today to give lab results

## 2015-01-22 NOTE — Telephone Encounter (Signed)
Spoke with pt and reviewed recent lab results with him.  

## 2015-01-23 ENCOUNTER — Other Ambulatory Visit: Payer: PPO

## 2015-02-05 ENCOUNTER — Other Ambulatory Visit: Payer: Self-pay | Admitting: Internal Medicine

## 2015-02-09 ENCOUNTER — Other Ambulatory Visit: Payer: Self-pay | Admitting: Internal Medicine

## 2015-02-26 ENCOUNTER — Other Ambulatory Visit: Payer: Self-pay | Admitting: Internal Medicine

## 2015-03-25 ENCOUNTER — Other Ambulatory Visit: Payer: Self-pay | Admitting: Internal Medicine

## 2015-04-29 ENCOUNTER — Other Ambulatory Visit: Payer: Self-pay

## 2015-04-30 ENCOUNTER — Other Ambulatory Visit: Payer: Self-pay | Admitting: Internal Medicine

## 2015-04-30 ENCOUNTER — Other Ambulatory Visit: Payer: Self-pay

## 2015-04-30 MED ORDER — GLIPIZIDE 5 MG PO TABS: ORAL_TABLET | ORAL | Status: DC

## 2015-05-07 ENCOUNTER — Ambulatory Visit (INDEPENDENT_AMBULATORY_CARE_PROVIDER_SITE_OTHER): Payer: PPO

## 2015-05-07 VITALS — BP 130/70 | HR 60 | Temp 98.0°F | Resp 12 | Ht 70.5 in | Wt 193.0 lb

## 2015-05-07 DIAGNOSIS — Z0000 Encounter for general adult medical examination without abnormal findings: Secondary | ICD-10-CM | POA: Diagnosis not present

## 2015-05-07 NOTE — Progress Notes (Signed)
Subjective:   Brett [name] is a 74 y.o. male who presents for Medicare Annual/Subsequent preventive examination.  Review of Systems:  No ROS.  Medicare Wellness Visit. Cardiac Risk Factors include: advanced age (>66men, >43 women);male gender;diabetes mellitus     Objective:    Vitals: BP 130/70 mmHg  Pulse 60  Temp(Src) 98 F (36.7 C) (Oral)  Resp 12  Ht 5' 10.5" (1.791 m)  Wt 193 lb (87.544 kg)  BMI 27.29 kg/m2  SpO2 97%  Tobacco History  Smoking status  . Never Smoker   Smokeless tobacco  . Never Used     Counseling given: Not Answered   Past Medical History  Diagnosis Date  . Hypertension   . Hyperlipidemia   . Diabetes mellitus   . Myocardial infarction ([place])   . Allergy     resolved after CABG  . Pulmonary nodule seen on imaging study 2012  . H/O renal calculi 2012  . Peyronie's disease   . Impotence of organic origin   . Swelling, mass, or lump in chest   . Unspecified hypothyroidism   . Hematuria, unspecified   . Calculus of ureter   . Coronary artery disease    Past Surgical History  Procedure Laterality Date  . Colonoscopy    . Penile prosthesis implant    . Cardiac catheterization  2001    [facility] x3 stent  . 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 Coronary artery bypass graft     Family History  Problem Relation Age of Onset  . Hyperlipidemia Mother   . AAA (abdominal aortic aneurysm) Mother   . Heart disease Father 37   History  Sexual Activity  . Sexual Activity: Not Currently    Outpatient Encounter Prescriptions as of 05/07/2015  Medication Sig  . aspirin 81 MG tablet Take 1 tablet (81 mg total) by mouth daily.  Marland Kitchen atorvastatin (LIPITOR) 80 MG tablet Take 1 tablet (80 mg total) by mouth daily.  Marland Kitchen glipiZIDE (GLUCOTROL) 5 MG tablet TAKE 1 TABLET (5 MG TOTAL) BY MOUTH DAILY BEFORE BREAKFAST.  Marland Kitchen glucose blood test strip 1 each by Other route daily. Dx. 250.00  . ibuprofen  (ADVIL,MOTRIN) 800 MG tablet TAKE 1 TABLET (800 MG TOTAL) BY MOUTH EVERY 8 (EIGHT) HOURS AS NEEDED FOR MODERATE PAIN.  Marland Kitchen levothyroxine (SYNTHROID, LEVOTHROID) 112 MCG tablet TAKE 1 TABLET BY MOUTH EVERY DAY  . losartan (COZAAR) 25 MG tablet TAKE 1 TABLET (25 MG TOTAL) BY MOUTH DAILY.  . metoprolol tartrate (LOPRESSOR) 25 MG tablet Take 25 mg by mouth daily.  . tamsulosin (FLOMAX) 0.4 MG CAPS capsule Take 0.4 mg by mouth.  . venlafaxine (EFFEXOR) 75 MG tablet Take 75 mg by mouth daily.  . [DISCONTINUED] glipiZIDE (GLUCOTROL) 5 MG tablet TAKE 1 TABLET (5 MG TOTAL) BY MOUTH DAILY BEFORE BREAKFAST.  . [DISCONTINUED] levothyroxine (SYNTHROID, LEVOTHROID) 112 MCG tablet TAKE 1 TABLET BY MOUTH EVERY DAY  . [DISCONTINUED] levothyroxine (SYNTHROID, LEVOTHROID) 112 MCG tablet TAKE 1 TABLET BY MOUTH EVERY DAY  . [DISCONTINUED] losartan (COZAAR) 25 MG tablet TAKE 1 TABLET (25 MG TOTAL) BY MOUTH DAILY.   No facility-administered encounter medications on file as of 05/07/2015.    Activities of Daily Living In your present state of health, do you have any difficulty performing the following activities: 05/07/2015  Hearing? N  Vision? N  Difficulty concentrating or making decisions? N  Walking or climbing stairs? N  Dressing or bathing? N  Doing errands, shopping? N  Preparing Food and eating ? N  Using the Toilet? N  In the past six months, have you accidently leaked urine? N  Do you have problems with loss of bowel control? N  Managing your Medications? N  Managing your Finances? N  Housekeeping or managing your Housekeeping? N    Patient Care Team: Crecencio Mc, MD as PCP - General (Internal Medicine)   Assessment:    This is a routine wellness examination for [name]. The goal of the wellness visit is to assist the patient how to close the gaps in care and create a preventative care plan for the patient.   Bone Density/Risk for Osteoporosis discussed.  Taking meds without issues; no  barriers identified. Reports Tamsulosin is working great.  Safety issues reviewed; smoke detectors in the home. Firearms in the home secured and in a locked area. Wears seatbelts when driving or riding with others. No violence in the home.  The patient was oriented x 3; appropriate in dress and manner and no objective failures at ADL's or IADL's.   ZOSTAVAX postponed, per patient request  Patient Concerns: C/O intermittent cough with uncomfortable "catch" feeling at times,  denies pain; requests repeat chest XRAY.  Noticeable change in depression at times; requests increase of EFFEXOR dose.  BS staying around 130; self changed Glipizide dose to half doses instead of whole.  Encouraged to take as prescribed.  Deferred to PCP for follow up.  Appointment scheduled.  Exercise Activities and Dietary recommendations Current Exercise Habits:: Structured exercise class, Type of exercise: walking, Time (Minutes): 60, Frequency (Times/Week): 3, Weekly Exercise (Minutes/Week): 180, Intensity: Intense  Goals    . Maintain     Maintain new exercise regiment of walking 3 times a week for 1 hour with wife.  Pace as tolerated.  Continue drinking plenty of water and eating a healthy diet.      Fall Risk Fall Risk  05/07/2015 05/30/2014 05/08/2012  Falls in the past year? No No No   Depression Screen PHQ 2/9 Scores 05/07/2015 05/30/2014 05/08/2012 05/08/2012  PHQ - 2 Score 2 0 2 1  PHQ- 9 Score 3 - 4 -    Cognitive Testing MMSE - Mini Mental State Exam 05/07/2015  Orientation to time 5  Orientation to Place 5  Registration 3  Attention/ Calculation 5  Recall 3  Language- name 2 objects 2  Language- repeat 1  Language- follow 3 step command 3  Language- read & follow direction 1  Write a sentence 1  Copy design 1  Total score 30    Immunization History  Administered Date(s) Administered  . Influenza Split 03/16/2013  . Influenza Whole 02/14/2012  . Influenza-Unspecified 02/05/2013,  02/05/2014, 02/11/2015  . Pneumococcal Conjugate-13 03/16/2013  . Pneumococcal Polysaccharide-23 07/08/2009  . Tdap 03/16/2013   Screening Tests Health Maintenance  Topic Date Due  . ZOSTAVAX  05/07/2015 (Originally 03/28/2003)  . HEMOGLOBIN A1C  06/21/2015  . OPHTHALMOLOGY EXAM  10/19/2015  . FOOT EXAM  12/19/2015  . URINE MICROALBUMIN  12/19/2015  . INFLUENZA VACCINE  01/06/2016  . COLONOSCOPY  10/05/2019  . TETANUS/TDAP  03/17/2023  . PNA vac Low Risk Adult  Completed      Plan:    End of life planning; Advance aging; Advanced directives was discussed. Copy requested of current HCPOA/Living Will.  Return in December for follow up with PCP  Follow up with eye exam  During the course of the  visit the patient was educated and counseled about the following appropriate screening and preventive services:   Vaccines to include Pneumoccal, Influenza, Hepatitis B, Td, Zostavax, HCV  Electrocardiogram  Cardiovascular Disease  Colorectal cancer screening  Diabetes screening  Prostate Cancer Screening  Glaucoma screening  Nutrition counseling   Smoking cessation counseling  Patient Instructions (the written plan) was given to the patient.    Varney Biles, LPN  [id number]

## 2015-05-07 NOTE — Patient Instructions (Addendum)
Brett [name],  Thank you for taking time to come for your Medicare Wellness Visit.  I appreciate your ongoing commitment to your health goals. Please review the following plan we discussed and let me know if I can assist you in the future.  Make eye exam appointment  Return in December for follow up with PCP   Health Maintenance, Male A healthy lifestyle and preventative care can promote health and wellness.  Maintain regular health, dental, and eye exams.  Eat a healthy diet. Foods like vegetables, fruits, whole grains, low-fat dairy products, and lean protein foods contain the nutrients you need and are low in calories. Decrease your intake of foods high in solid fats, added sugars, and salt. Get information about a proper diet from your health care provider, if necessary.  Regular physical exercise is one of the most important things you can do for your health. Most adults should get at least 150 minutes of moderate-intensity exercise (any activity that increases your heart rate and causes you to sweat) each week. In addition, most adults need muscle-strengthening exercises on 2 or more days a week.   Maintain a healthy weight. The body mass index (BMI) is a screening tool to identify possible weight problems. It provides an estimate of body fat based on height and weight. Your health care provider can find your BMI and can help you achieve or maintain a healthy weight. For males 20 years and older:  A BMI below 18.5 is considered underweight.  A BMI of 18.5 to 24.9 is normal.  A BMI of 25 to 29.9 is considered overweight.  A BMI of 30 and above is considered obese.  Maintain normal blood lipids and cholesterol by exercising and minimizing your intake of saturated fat. Eat a balanced diet with plenty of fruits and vegetables. Blood tests for lipids and cholesterol should begin at age 72 and be repeated every 5 years. If your lipid or cholesterol levels are high, you are over age 56, or  you are at high risk for heart disease, you may need your cholesterol levels checked more frequently.Ongoing high lipid and cholesterol levels should be treated with medicines if diet and exercise are not working.  If you smoke, find out from your health care provider how to quit. If you do not use tobacco, do not start.  Lung cancer screening is recommended for adults aged 58-80 years who are at high risk for developing lung cancer because of a history of smoking. A yearly low-dose CT scan of the lungs is recommended for people who have at least a 30-pack-year history of smoking and are current smokers or have quit within the past 15 years. A pack year of smoking is smoking an average of 1 pack of cigarettes a day for 1 year (for example, a 30-pack-year history of smoking could mean smoking 1 pack a day for 30 years or 2 packs a day for 15 years). Yearly screening should continue until the smoker has stopped smoking for at least 15 years. Yearly screening should be stopped for people who develop a health problem that would prevent them from having lung cancer treatment.  If you choose to drink alcohol, do not have more than 2 drinks per day. One drink is considered to be 12 oz (360 mL) of beer, 5 oz (150 mL) of wine, or 1.5 oz (45 mL) of liquor.  Avoid the use of street drugs. Do not share needles with anyone. Ask for help if you need support  or instructions about stopping the use of drugs.  High blood pressure causes heart disease and increases the risk of stroke. High blood pressure is more likely to develop in:  People who have blood pressure in the end of the normal range (100-139/85-89 mm Hg).  People who are overweight or obese.  People who are African American.  If you are 36-41 years of age, have your blood pressure checked every 3-5 years. If you are 53 years of age or older, have your blood pressure checked every year. You should have your blood pressure measured twice--once when you  are at a hospital or clinic, and once when you are not at a hospital or clinic. Record the average of the two measurements. To check your blood pressure when you are not at a hospital or clinic, you can use:  An automated blood pressure machine at a pharmacy.  A home blood pressure monitor.  If you are 40-62 years old, ask your health care provider if you should take aspirin to prevent heart disease.  Diabetes screening involves taking a blood sample to check your fasting blood sugar level. This should be done once every 3 years after age 47 if you are at a normal weight and without risk factors for diabetes. Testing should be considered at a younger age or be carried out more frequently if you are overweight and have at least 1 risk factor for diabetes.  Colorectal cancer can be detected and often prevented. Most routine colorectal cancer screening begins at the age of 41 and continues through age 96. However, your health care provider may recommend screening at an earlier age if you have risk factors for colon cancer. On a yearly basis, your health care provider may provide home test kits to check for hidden blood in the stool. A small camera at the end of a tube may be used to directly examine the colon (sigmoidoscopy or colonoscopy) to detect the earliest forms of colorectal cancer. Talk to your health care provider about this at age 28 when routine screening begins. A direct exam of the colon should be repeated every 5-10 years through age 77, unless early forms of precancerous polyps or small growths are found.  People who are at an increased risk for hepatitis B should be screened for this virus. You are considered at high risk for hepatitis B if:  You were born in a country where hepatitis B occurs often. Talk with your health care provider about which countries are considered high risk.  Your parents were born in a high-risk country and you have not received a shot to protect against  hepatitis B (hepatitis B vaccine).  You have HIV or AIDS.  You use needles to inject street drugs.  You live with, or have sex with, someone who has hepatitis B.  You are a man who has sex with other men (MSM).  You get hemodialysis treatment.  You take certain medicines for conditions like cancer, organ transplantation, and autoimmune conditions.  Hepatitis C blood testing is recommended for all people born from [birth year] through 1963 and any individual with known risk factors for hepatitis C.  Healthy men should no longer receive prostate-specific antigen (PSA) blood tests as part of routine cancer screening. Talk to your health care provider about prostate cancer screening.  Testicular cancer screening is not recommended for adolescents or adult males who have no symptoms. Screening includes self-exam, a health care provider exam, and other screening tests. Consult with your health  care provider about any symptoms you have or any concerns you have about testicular cancer.  Practice safe sex. Use condoms and avoid high-risk sexual practices to reduce the spread of sexually transmitted infections (STIs).  You should be screened for STIs, including gonorrhea and chlamydia if:  You are sexually active and are younger than 24 years.  You are older than 24 years, and your health care provider tells you that you are at risk for this type of infection.  Your sexual activity has changed since you were last screened, and you are at an increased risk for chlamydia or gonorrhea. Ask your health care provider if you are at risk.  If you are at risk of being infected with HIV, it is recommended that you take a prescription medicine daily to prevent HIV infection. This is called pre-exposure prophylaxis (PrEP). You are considered at risk if:  You are a man who has sex with other men (MSM).  You are a heterosexual man who is sexually active with multiple partners.  You take drugs by  injection.  You are sexually active with a partner who has HIV.  Talk with your health care provider about whether you are at high risk of being infected with HIV. If you choose to begin PrEP, you should first be tested for HIV. You should then be tested every 3 months for as long as you are taking PrEP.  Use sunscreen. Apply sunscreen liberally and repeatedly throughout the day. You should seek shade when your shadow is shorter than you. Protect yourself by wearing long sleeves, pants, a wide-brimmed hat, and sunglasses year round whenever you are outdoors.  Tell your health care provider of new moles or changes in moles, especially if there is a change in shape or color. Also, tell your health care provider if a mole is larger than the size of a pencil eraser.  A one-time screening for abdominal aortic aneurysm (AAA) and surgical repair of large AAAs by ultrasound is recommended for men aged 58-75 years who are current or former smokers.  Stay current with your vaccines (immunizations).   This information is not intended to replace advice given to you by your health care provider. Make sure you discuss any questions you have with your health care provider.   Document Released: 11/20/2007 Document Revised: 06/14/2014 Document Reviewed: 10/19/2010 Elsevier Interactive Patient Education [employer].

## 2015-05-08 NOTE — Progress Notes (Signed)
  I have reviewed the above information and agree with above.   Kapri Nero, MD 

## 2015-05-14 ENCOUNTER — Other Ambulatory Visit: Payer: Self-pay | Admitting: Internal Medicine

## 2015-06-06 ENCOUNTER — Ambulatory Visit (INDEPENDENT_AMBULATORY_CARE_PROVIDER_SITE_OTHER): Payer: PPO | Admitting: Internal Medicine

## 2015-06-06 ENCOUNTER — Encounter: Payer: Self-pay | Admitting: Internal Medicine

## 2015-06-06 VITALS — BP 122/62 | HR 60 | Temp 98.1°F | Resp 12 | Ht 71.0 in | Wt 194.6 lb

## 2015-06-06 DIAGNOSIS — E034 Atrophy of thyroid (acquired): Secondary | ICD-10-CM

## 2015-06-06 DIAGNOSIS — I10 Essential (primary) hypertension: Secondary | ICD-10-CM

## 2015-06-06 DIAGNOSIS — E038 Other specified hypothyroidism: Secondary | ICD-10-CM

## 2015-06-06 DIAGNOSIS — E119 Type 2 diabetes mellitus without complications: Secondary | ICD-10-CM

## 2015-06-06 DIAGNOSIS — R911 Solitary pulmonary nodule: Secondary | ICD-10-CM

## 2015-06-06 DIAGNOSIS — E785 Hyperlipidemia, unspecified: Secondary | ICD-10-CM

## 2015-06-06 DIAGNOSIS — G4700 Insomnia, unspecified: Secondary | ICD-10-CM

## 2015-06-06 LAB — LIPID PANEL
Cholesterol: 169 mg/dL (ref 125–200)
HDL: 48 mg/dL (ref >=40)
LDL CALC: 90 mg/dL (ref <130)
Total CHOL/HDL Ratio: 3.5 Ratio (ref <=5.0)
Triglycerides: 153 mg/dL — ABNORMAL HIGH (ref <150)
VLDL: 31 mg/dL — AB (ref <30)

## 2015-06-06 LAB — COMPREHENSIVE METABOLIC PANEL
ALBUMIN: 3.9 g/dL (ref 3.6–5.1)
ALT: 24 U/L (ref 9–46)
AST: 27 U/L (ref 10–35)
Alkaline Phosphatase: 69 U/L (ref 40–115)
BILIRUBIN TOTAL: 0.4 mg/dL (ref 0.2–1.2)
BUN: 21 mg/dL (ref 7–25)
CO2: 28 mmol/L (ref 20–31)
CREATININE: 1.01 mg/dL (ref 0.70–1.18)
Calcium: 8.9 mg/dL (ref 8.6–10.3)
Chloride: 107 mmol/L (ref 98–110)
Glucose, Bld: 119 mg/dL — ABNORMAL HIGH (ref 65–99)
Potassium: 4.2 mmol/L (ref 3.5–5.3)
SODIUM: 139 mmol/L (ref 135–146)
TOTAL PROTEIN: 7 g/dL (ref 6.1–8.1)

## 2015-06-06 NOTE — Patient Instructions (Addendum)
For your insomnia,  You can try 5 mg melatonin ,  Take one hour before bed (or even earlier,  Like right after dinner)   If no improvement,  CALL for a medication  CT scan will be ordered  Your diet sounds very good,  But if you want to cut even more carbs,  Try the [employer]" (like a quiche but without the dough)  for a low carb egg/meat breakfast thatn can be microwaved.    You are due for a pneumonia vaccine but we are currently out of it so please return next month

## 2015-06-06 NOTE — Progress Notes (Signed)
Subjective:  Patient ID: Brett [name] Seen, male    DOB: [birth year]  Age: 74 y.o. MRN: [id number]  CC: The primary encounter diagnosis was Diabetes mellitus without complication ([place]). Diagnoses of Hypothyroidism due to acquired atrophy of thyroid, Hyperlipidemia LDL goal <70, Essential hypertension, Pulmonary nodule seen on imaging study, and Insomnia were also pertinent to this visit.  HPI Brett [name] presents for follow up on  type 2 DM, hypertension, hyperlipidemia.   BS are now higher in the 140 range fasting .  Has been taking 2.5 mg glipizide bid.  No low blood sugars.  No desserts night , at the most twice weekly. .  Diet reviewed; breakfast is oatmeal with bluberries,  Or a jimmy dean biscuit.  Lunch: sandwhich on whole wheat,  Salad with a protein .  Dinner: pasta once a week,  Grilled meats,  Green salad.  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 Walking without dyspnea.       Insomnia occurring about 3 times per week .  Has tried OTC Benadryl and nyquil but both have caused next morning grogginess.  No prior trial of melatonin.  Nighttime regimen reviewed, no glaring errors or overuse of caffiene/alcohol      Outpatient Prescriptions Prior to Visit  Medication Sig Dispense Refill  . aspirin 81 MG tablet Take 1 tablet (81 mg total) by mouth daily. 30 tablet 11  . atorvastatin (LIPITOR) 80 MG tablet Take 1 tablet (80 mg total) by mouth daily. 90 tablet 3  . glipiZIDE (GLUCOTROL) 5 MG tablet TAKE 1 TABLET (5 MG TOTAL) BY MOUTH DAILY BEFORE BREAKFAST. 90 tablet 2  . glucose blood test strip 1 each by Other route daily. Dx. 250.00 100 each 2  . levothyroxine (SYNTHROID, LEVOTHROID) 112 MCG tablet TAKE 1 TABLET BY MOUTH EVERY DAY 30 tablet 2  . losartan (COZAAR) 25 MG tablet TAKE 1 TABLET (25 MG  TOTAL) BY MOUTH DAILY. 90 tablet 0  . metoprolol tartrate (LOPRESSOR) 25 MG tablet Take 25 mg by mouth daily.    . tamsulosin (FLOMAX) 0.4 MG CAPS capsule Take 0.4 mg by mouth.    . venlafaxine (EFFEXOR) 75 MG tablet Take 75 mg by mouth daily.    Marland Kitchen ibuprofen (ADVIL,MOTRIN) 800 MG tablet TAKE 1 TABLET (800 MG TOTAL) BY MOUTH EVERY 8 (EIGHT) HOURS AS NEEDED FOR MODERATE PAIN. 90 tablet 1   No facility-administered medications prior to visit.    Review of Systems;  Patient denies headache, fevers, malaise, unintentional weight loss, skin rash, eye pain, sinus congestion and sinus pain, sore throat, dysphagia,  hemoptysis , cough, dyspnea, wheezing, chest pain, palpitations, orthopnea, edema, abdominal pain, nausea, melena, diarrhea, constipation, flank pain, dysuria, hematuria, urinary  Frequency, nocturia, numbness, tingling, seizures,  Focal weakness, Loss of consciousness,  Tremor, insomnia, depression, anxiety, and suicidal ideation.      Objective:  BP 122/62 mmHg  Pulse 60  Temp(Src) 98.1 F (36.7 C) (Oral)  Resp 12  Ht _0  (1.803 m)  Wt 194 lb 9.6 oz (88.27 kg)  BMI 27.15 kg/m2  SpO2 97%  BP Readings from Last 3 Encounters:  06/06/15 122/62  05/07/15 130/70  12/19/14 108/60    Wt Readings from Last 3 Encounters:  06/06/15 194 lb 9.6 oz (88.27 kg)  05/07/15 193 lb (  87.544 kg)  12/19/14 188 lb (85.276 kg)    General appearance: alert, cooperative and appears stated age Ears: normal TM's and external ear canals both ears Throat: lips, mucosa, and tongue normal; teeth and gums normal Neck: no adenopathy, no carotid bruit, supple, symmetrical, trachea midline and thyroid not enlarged, symmetric, no tenderness/mass/nodules Back: symmetric, no curvature. ROM normal. No CVA tenderness. Lungs: clear to auscultation bilaterally Heart: regular rate and rhythm, S1, S2 normal, no murmur, click, rub or gallop Abdomen: soft, non-tender; bowel sounds normal; no masses,  no  organomegaly Pulses: 2+ and symmetric Skin: Skin color, texture, turgor normal. No rashes or lesions Lymph nodes: Cervical, supraclavicular, and axillary nodes normal.  Lab Results  Component Value Date   HGBA1C 6.9* 06/06/2015   HGBA1C 6.4 12/19/2014   HGBA1C 6.6* 05/23/2014    Lab Results  Component Value Date   CREATININE 1.01 06/06/2015   CREATININE 0.98 01/17/2015   CREATININE 1.09 12/19/2014    Lab Results  Component Value Date   WBC 5.6 11/22/2013   HGB 13.8 11/22/2013   HCT 41.3 11/22/2013   PLT 265.0 11/22/2013   GLUCOSE 119* 06/06/2015   CHOL 169 06/06/2015   TRIG 153* 06/06/2015   HDL 48 06/06/2015   LDLDIRECT 86.0 12/20/2014   LDLCALC 90 06/06/2015   ALT 24 06/06/2015   AST 27 06/06/2015   NA 139 06/06/2015   K 4.2 06/06/2015   CL 107 06/06/2015   CREATININE 1.01 06/06/2015   BUN 21 06/06/2015   CO2 28 06/06/2015   TSH 2.314 06/06/2015   PSA 0.67 11/22/2013   HGBA1C 6.9* 06/06/2015   MICROALBUR 0.6 06/06/2015    No results found.  Assessment & Plan:   Problem List Items Addressed This Visit    Hypertension    Well controlled on current regimen. Renal function stable, no changes today.  Lab Results  Component Value Date   CREATININE 1.01 06/06/2015   Lab Results  Component Value Date   NA 139 06/06/2015   K 4.2 06/06/2015   CL 107 06/06/2015   CO2 28 06/06/2015         Hypothyroidism    Thyroid function is WNL on current dose.  No current changes needed.   Lab Results  Component Value Date   TSH 2.314 06/06/2015         Relevant Orders   Comp Met (CMET) (Completed)   HgB A1c (Completed)   TSH (Completed)   Lipid Profile (Completed)   Hyperlipidemia LDL goal <70    Well controlled on current statin therapy.   Liver enzymes are normal , no changes today.  Lab Results  Component Value Date   CHOL 169 06/06/2015   HDL 48 06/06/2015   LDLCALC 90 06/06/2015   LDLDIRECT 86.0 12/20/2014   TRIG 153* 06/06/2015   CHOLHDL 3.5  06/06/2015   Lab Results  Component Value Date   ALT 24 06/06/2015   AST 27 06/06/2015   ALKPHOS 69 06/06/2015   BILITOT 0.4 06/06/2015             Pulmonary nodule seen on imaging study    Prior CT of lungs was reviewed with patient today,  Repeat CT ordered.       Relevant Orders   CT CHEST NODULE FOLLOW UP LOW DOSE W/O   Insomnia    Advised to try melatonin 5 mg qhs and resume exercise .  bedtime routine reviewed.        Other Visit Diagnoses  Diabetes mellitus without complication ([place])    -  Primary    Relevant Orders    Comp Met (CMET) (Completed)    HgB A1c (Completed)    TSH (Completed)    Lipid Profile (Completed)    Urine Microalbumin w/creat. ratio (Completed)       I have discontinued Mr. Sampath's ibuprofen. I am also having him maintain his aspirin, venlafaxine, metoprolol tartrate, glucose blood, atorvastatin, losartan, glipiZIDE, tamsulosin, and levothyroxine.  No orders of the defined types were placed in this encounter.    Medications Discontinued During This Encounter  Medication Reason  . ibuprofen (ADVIL,MOTRIN) 800 MG tablet     Follow-up: Return in about 4 weeks (around 07/04/2015).   Crecencio Mc, MD

## 2015-06-07 LAB — MICROALBUMIN / CREATININE URINE RATIO
CREATININE, URINE: 190 mg/dL (ref 20–370)
MICROALB/CREAT RATIO: 3 ug/mg{creat} (ref <30)
Microalb, Ur: 0.6 mg/dL

## 2015-06-07 LAB — TSH: TSH: 2.314 u[IU]/mL (ref 0.350–4.500)

## 2015-06-07 LAB — HEMOGLOBIN A1C
HEMOGLOBIN A1C: 6.9 % — AB (ref <5.7)
MEAN PLASMA GLUCOSE: 151 mg/dL — AB (ref <117)

## 2015-06-08 DIAGNOSIS — G4700 Insomnia, unspecified: Secondary | ICD-10-CM | POA: Insufficient documentation

## 2015-06-08 NOTE — Assessment & Plan Note (Signed)
Advised to try melatonin 5 mg qhs and resume exercise .  bedtime routine reviewed.

## 2015-06-08 NOTE — Assessment & Plan Note (Signed)
Prior CT of lungs was reviewed with patient today,  Repeat CT ordered.

## 2015-06-08 NOTE — Assessment & Plan Note (Signed)
Well controlled on current statin therapy.   Liver enzymes are normal , no changes today.  Lab Results  Component Value Date   CHOL 169 06/06/2015   HDL 48 06/06/2015   LDLCALC 90 06/06/2015   LDLDIRECT 86.0 12/20/2014   TRIG 153* 06/06/2015   CHOLHDL 3.5 06/06/2015   Lab Results  Component Value Date   ALT 24 06/06/2015   AST 27 06/06/2015   ALKPHOS 69 06/06/2015   BILITOT 0.4 06/06/2015

## 2015-06-08 NOTE — Assessment & Plan Note (Signed)
Thyroid function is WNL on current dose.  No current changes needed.   Lab Results  Component Value Date   TSH 2.314 06/06/2015

## 2015-06-08 NOTE — Assessment & Plan Note (Signed)
Well controlled on current regimen. Renal function stable, no changes today.  Lab Results  Component Value Date   CREATININE 1.01 06/06/2015   Lab Results  Component Value Date   NA 139 06/06/2015   K 4.2 06/06/2015   CL 107 06/06/2015   CO2 28 06/06/2015

## 2015-06-08 NOTE — Assessment & Plan Note (Signed)
He reports Recent loss of control despite adherence to medication and diet. Reassurance provided,  No medication changes are needed given risk of hyoglyemia.  Resume regular exercise.   Lab Results  Component Value Date   HGBA1C 6.9* 06/06/2015   Lab Results  Component Value Date   MICROALBUR 0.6 06/06/2015

## 2015-06-09 ENCOUNTER — Encounter: Payer: Self-pay | Admitting: Internal Medicine

## 2015-06-17 ENCOUNTER — Other Ambulatory Visit: Payer: Self-pay | Admitting: *Deleted

## 2015-06-17 MED ORDER — LEVOTHYROXINE SODIUM 112 MCG PO TABS: 112.0000 ug | ORAL_TABLET | Freq: Every day | ORAL | Status: DC

## 2015-06-19 ENCOUNTER — Ambulatory Visit: Payer: PPO

## 2015-06-24 ENCOUNTER — Ambulatory Visit: Payer: PPO

## 2015-06-24 NOTE — Telephone Encounter (Signed)
Mailed unread message to patient.  

## 2015-06-25 ENCOUNTER — Ambulatory Visit (INDEPENDENT_AMBULATORY_CARE_PROVIDER_SITE_OTHER): Payer: PPO

## 2015-06-25 DIAGNOSIS — Z23 Encounter for immunization: Secondary | ICD-10-CM

## 2015-06-25 NOTE — Progress Notes (Signed)
Patient here today for Pneumovax 23 immunization. Given in Left deltoid, patient tolerated well.

## 2015-07-01 ENCOUNTER — Ambulatory Visit
Admission: RE | Admit: 2015-07-01 | Discharge: 2015-07-01 | Disposition: A | Discharge status: Home / Self Care | Payer: PPO | Source: Ambulatory Visit | Attending: Internal Medicine | Admitting: Internal Medicine

## 2015-07-01 DIAGNOSIS — K8020 Calculus of gallbladder without cholecystitis without obstruction: Secondary | ICD-10-CM | POA: Diagnosis not present

## 2015-07-01 DIAGNOSIS — R918 Other nonspecific abnormal finding of lung field: Secondary | ICD-10-CM | POA: Diagnosis not present

## 2015-07-01 DIAGNOSIS — R911 Solitary pulmonary nodule: Secondary | ICD-10-CM

## 2015-07-02 ENCOUNTER — Encounter: Payer: Self-pay | Admitting: Internal Medicine

## 2015-07-07 ENCOUNTER — Encounter: Payer: Self-pay | Admitting: Internal Medicine

## 2015-07-07 ENCOUNTER — Ambulatory Visit (INDEPENDENT_AMBULATORY_CARE_PROVIDER_SITE_OTHER): Payer: PPO | Admitting: Internal Medicine

## 2015-07-07 VITALS — BP 120/72 | HR 58 | Temp 97.8°F | Resp 12 | Ht 71.0 in | Wt 194.8 lb

## 2015-07-07 DIAGNOSIS — E083599 Diabetes mellitus due to underlying condition with proliferative diabetic retinopathy without macular edema, unspecified eye: Secondary | ICD-10-CM | POA: Diagnosis not present

## 2015-07-07 DIAGNOSIS — R911 Solitary pulmonary nodule: Secondary | ICD-10-CM

## 2015-07-07 MED ORDER — GLUCOSE BLOOD VI STRP: 1.0000 | ORAL_STRIP | Freq: Every day | Status: DC

## 2015-07-07 NOTE — Progress Notes (Signed)
Pre-visit discussion using our clinic review tool. No additional management support is needed unless otherwise documented below in the visit note.  

## 2015-07-07 NOTE — Progress Notes (Signed)
Subjective:  Patient ID: Brett [name] Seen, male    DOB: 1941/03/23  Age: 75 y.o. MRN: [id number]  CC: The primary encounter diagnosis was Pulmonary nodule seen on imaging study. 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  HPI Brett [name] presents for follow up on recent CT chest to follow up on pulmonary  Nodules found several years ago after his heart surgery. Report of recent CT reviewed with patient today. . Patient has two nodules in left lung that have not changed in 3 years   Follow up on DM:  Since his reduction in glipizide to 2.5 mg bid due to hypoglycemia , he has noted no recrruent lows.  His fasting 140 to 145 in the morning  .  Outpatient Prescriptions Prior to Visit  Medication Sig Dispense Refill  . aspirin 81 MG tablet Take 1 tablet (81 mg total) by mouth daily. 30 tablet 11  . atorvastatin (LIPITOR) 80 MG tablet Take 1 tablet (80 mg total) by mouth daily. 90 tablet 3  . glipiZIDE (GLUCOTROL) 5 MG tablet TAKE 1 TABLET (5 MG TOTAL) BY MOUTH DAILY BEFORE BREAKFAST. 90 tablet 2  . levothyroxine (SYNTHROID, LEVOTHROID) 112 MCG tablet Take 1 tablet (112 mcg total) by mouth daily. 90 tablet 3  . losartan (COZAAR) 25 MG tablet TAKE 1 TABLET (25 MG TOTAL) BY MOUTH DAILY. 90 tablet 0  . metoprolol tartrate (LOPRESSOR) 25 MG tablet Take 25 mg by mouth daily.    . tamsulosin (FLOMAX) 0.4 MG CAPS capsule Take 0.4 mg by mouth.    . venlafaxine (EFFEXOR) 75 MG tablet Take 75 mg by mouth daily.    Marland Kitchen glucose blood test strip 1 each by Other route daily. Dx. 250.00 100 each 2   No facility-administered medications prior to visit.    Review of Systems;  Patient denies headache, fevers, malaise, unintentional weight loss, skin rash, eye pain, sinus congestion and sinus pain, sore throat, dysphagia,  hemoptysis , cough, dyspnea, wheezing, chest pain, palpitations,  orthopnea, edema, abdominal pain, nausea, melena, diarrhea, constipation, flank pain, dysuria, hematuria, urinary  Frequency, nocturia, numbness, tingling, seizures,  Focal weakness, Loss of consciousness,  Tremor, insomnia, depression, anxiety, and suicidal ideation.      Objective:  BP 120/72 mmHg  Pulse 58  Temp(Src) 97.8 F (36.6 C) (Oral)  Resp 12  Ht 5\' 11"  (1.803 m)  Wt 194 lb 12 oz (88.338 kg)  BMI 27.17 kg/m2  SpO2 95%  BP Readings from Last 3 Encounters:  07/07/15 120/72  06/06/15 122/62  05/07/15 130/70    Wt Readings from Last 3 Encounters:  07/07/15 194 lb 12 oz (88.338 kg)  06/06/15 194 lb 9.6 oz (88.27 kg)  05/07/15 193 lb (87.544 kg)    General appearance: alert, cooperative and appears stated age Ears: normal TM's and external ear canals both ears Throat: lips, mucosa, and tongue normal; teeth and gums normal Neck: no adenopathy, no carotid bruit, supple, symmetrical, trachea midline and thyroid not enlarged, symmetric, no tenderness/mass/nodules Back: symmetric, no curvature. ROM normal. No CVA tenderness. Lungs: clear to auscultation bilaterally Heart: regular rate and rhythm, S1, S2 normal, no murmur, click, rub or gallop Abdomen: soft, non-tender; bowel sounds normal; no masses,  no organomegaly Pulses: 2+ and symmetric Skin: Skin color, texture, turgor normal. No rashes or lesions Lymph nodes: Cervical, supraclavicular, and axillary nodes normal.  Lab Results  Component Value Date  HGBA1C 6.9* 06/06/2015   HGBA1C 6.4 12/19/2014   HGBA1C 6.6* 05/23/2014    Lab Results  Component Value Date   CREATININE 1.01 06/06/2015   CREATININE 0.98 01/17/2015   CREATININE 1.09 12/19/2014    Lab Results  Component Value Date   WBC 5.6 11/22/2013   HGB 13.8 11/22/2013   HCT 41.3 11/22/2013   PLT 265.0 11/22/2013   GLUCOSE 119* 06/06/2015   CHOL 169 06/06/2015   TRIG 153* 06/06/2015   HDL 48 06/06/2015   LDLDIRECT 86.0 12/20/2014   LDLCALC 90  06/06/2015   ALT 24 06/06/2015   AST 27 06/06/2015   NA 139 06/06/2015   K 4.2 06/06/2015   CL 107 06/06/2015   CREATININE 1.01 06/06/2015   BUN 21 06/06/2015   CO2 28 06/06/2015   TSH 2.314 06/06/2015   PSA 0.67 11/22/2013   HGBA1C 6.9* 06/06/2015   MICROALBUR 0.6 06/06/2015    Ct Chest Wo Contrast  07/01/2015  CLINICAL DATA:  Follow-up of pulmonary nodule. EXAM: CT CHEST WITHOUT CONTRAST TECHNIQUE: Multidetector CT imaging of the chest was performed following the standard protocol without IV contrast. COMPARISON:  01/07/2012 FINDINGS: Mediastinum/Lymph Nodes: No masses or pathologically enlarged lymph nodes identified on this un-enhanced exam. The heart is normal in size. 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 Calcific atherosclerotic disease of the coronary arteries and aorta are noted. Lungs/Pleura: The left lower lobe subpleural solid pulmonary mass measures 2.0 by 2.7 by 2.2 cm. There is an associated linear subpleural thickening. A second subpleural soft tissue mass in the lateral aspect of the left upper lobe measures 1.7 by 1.1 by 2.0 cm. Wispy spicules extend from this mass to the left upper lobe bronchus, not significantly changed. Cylindrical bronchiectasis is seen in the left lower lobe and left upper lobe. There is moderate stable left lower lobe volume loss. Linear and ground-glass opacities with peripheral prominence in the right lower lobe are also unchanged. Upper abdomen: No acute findings. Cholelithiasis without evidence of acute cholecystitis. Musculoskeletal: No chest wall mass or suspicious bone lesions identified. IMPRESSION: Slightly smaller subpleural left lower lobe mass, and an essentially stable subpleural left upper lobe mass. Given their stability for over 3 years, benign process is favored. A follow-up in 1 year is recommended. Cholelithiasis. Electronically Signed   By: Fidela Salisbury M.D.   On: 07/01/2015 13:30    Assessment & Plan:   Problem List Items  Addressed This Visit    Pulmonary nodule seen on imaging study - Primary    There  Has been no change in 3 years  Repeat CT in one year.       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 Klingbeil maintain his aspirin, venlafaxine, metoprolol tartrate, atorvastatin, losartan, glipiZIDE, tamsulosin, levothyroxine, and glucose blood.  Meds ordered this encounter  Medications  . glucose blood test strip    Sig: 1 each by Other route daily. Dx. 250.00    Dispense:  100 each    Refill:  2    Medications Discontinued During This Encounter  Medication Reason  . glucose blood test strip Reorder    Follow-up: Return in about  6 months (around 01/04/2016) for follow up diabetes.   Crecencio Mc, MD

## 2015-07-07 NOTE — Patient Instructions (Addendum)
Good news: The two nodules in your left lung have not changed in 3 years, which suggests that they are benign . Follow up in one year is advised .     For your diabetes,  continue 2.5 mg glipizide twice daily for now.    Check sugars before bedtime for the next week  And send them to me for evaluation ( goal is < 160)   Remember that Exercise will lower a post prandial sugar very effectively by about 10-20 points.   We'll repeat your A1c in April,  And I'll see you again in July for your annual

## 2015-07-08 NOTE — Assessment & Plan Note (Addendum)
There  Has been no change in 3 years  Repeat CT in one year.

## 2015-07-08 NOTE — Assessment & Plan Note (Signed)
Advised to continue 2.5 mg glipizde bid until next a1c , an advised to go for a walk after dinner to improve sugars.

## 2015-07-15 DIAGNOSIS — N401 Enlarged prostate with lower urinary tract symptoms: Secondary | ICD-10-CM | POA: Diagnosis not present

## 2015-07-15 DIAGNOSIS — N138 Other obstructive and reflux uropathy: Secondary | ICD-10-CM | POA: Diagnosis not present

## 2015-07-15 DIAGNOSIS — Z0000 Encounter for general adult medical examination without abnormal findings: Secondary | ICD-10-CM | POA: Diagnosis not present

## 2015-07-16 NOTE — Telephone Encounter (Signed)
Mailed unread message to patient.  

## 2015-07-17 ENCOUNTER — Telehealth: Payer: Self-pay | Admitting: Internal Medicine

## 2015-07-17 NOTE — Telephone Encounter (Signed)
Brett [name], let me know if you need anything with this. thanks

## 2015-07-17 NOTE — Telephone Encounter (Signed)
Pt came in and dropped off his blood sugar readings..placed in Dr. Demetrios Isaacs box..please advise pt with any questions..  Pt also stated that he needs a refill on the glucose blood test strip Precision Xtra.Brett [name]

## 2015-07-18 ENCOUNTER — Telehealth: Payer: Self-pay

## 2015-07-18 DIAGNOSIS — E119 Type 2 diabetes mellitus without complications: Secondary | ICD-10-CM

## 2015-07-18 MED ORDER — GLUCOSE BLOOD VI STRP: 1.0000 | ORAL_STRIP | Freq: Every day | Status: DC

## 2015-07-18 MED ORDER — FREESTYLE SYSTEM KIT: 1.0000 | PACK | Status: AC | PRN

## 2015-07-18 NOTE — Telephone Encounter (Signed)
[facility] called and stated that Prescision xtra test strips are no longer available, pt needs new script for a new meter, test strips, and lancets. Also the  Dx code for the strips was out dated, they need a ICD10 code. Please advise, thanks

## 2015-07-18 NOTE — Telephone Encounter (Signed)
rxc for new glucomtere sent to pharmacy.

## 2015-07-18 NOTE — Telephone Encounter (Signed)
IN yellow folder and script for CBG strips sent to pharmacy.

## 2015-07-20 ENCOUNTER — Other Ambulatory Visit: Payer: Self-pay | Admitting: Internal Medicine

## 2015-07-20 NOTE — Telephone Encounter (Signed)
Evening blood sugars are excellent

## 2015-08-01 ENCOUNTER — Other Ambulatory Visit: Payer: Self-pay

## 2015-08-01 ENCOUNTER — Telehealth: Payer: Self-pay | Admitting: Internal Medicine

## 2015-08-01 MED ORDER — GLUCOSE BLOOD VI STRP: 1.0000 | ORAL_STRIP | Freq: Every day | Status: DC

## 2015-08-01 NOTE — Telephone Encounter (Signed)
Strips [facility]

## 2015-08-04 ENCOUNTER — Other Ambulatory Visit: Payer: Self-pay

## 2015-08-04 MED ORDER — FREESTYLE LANCETS MISC: Status: AC

## 2015-08-04 MED ORDER — ACCU-CHEK MULTICLIX LANCETS MISC: Status: DC

## 2015-08-04 MED ORDER — GLUCOSE BLOOD VI STRP: ORAL_STRIP | Status: DC

## 2015-08-04 MED ORDER — FREESTYLE LANCETS MISC: Status: DC

## 2015-09-01 ENCOUNTER — Other Ambulatory Visit: Payer: Self-pay | Admitting: Internal Medicine

## 2015-10-08 ENCOUNTER — Other Ambulatory Visit: Payer: Self-pay | Admitting: Internal Medicine

## 2015-11-10 ENCOUNTER — Other Ambulatory Visit: Payer: Self-pay | Admitting: Internal Medicine

## 2015-11-10 ENCOUNTER — Telehealth: Payer: Self-pay | Admitting: Internal Medicine

## 2015-11-10 NOTE — Telephone Encounter (Signed)
Request sent to Dr.Tullo or approval.

## 2015-11-10 NOTE — Telephone Encounter (Signed)
REFILL DENIED ,  NEEDS APPT

## 2015-11-10 NOTE — Telephone Encounter (Signed)
This is a historical medication. Pt last seen 07/07/15. Please advise?

## 2015-11-10 NOTE — Telephone Encounter (Signed)
venlafaxine (EFFEXOR) 75 MG tablet

## 2015-11-10 NOTE — Telephone Encounter (Signed)
Patient stated that he will get it from the [place].

## 2015-12-19 ENCOUNTER — Other Ambulatory Visit: Payer: Self-pay | Admitting: Internal Medicine

## 2015-12-26 ENCOUNTER — Telehealth: Payer: Self-pay

## 2015-12-26 NOTE — Telephone Encounter (Signed)
Attempted to reach Dr. Odella Aquas, left a detailed message for his return call with Dr. Lupita Dawn message. thanks

## 2015-12-26 NOTE — Telephone Encounter (Signed)
There is no reason that he should be taking it once daily ,  except that it was refilled incorrectly.  It should be taken twice daily

## 2015-12-26 NOTE — Telephone Encounter (Signed)
Dr. Desiree Hane, who states he is pt's PCP at [facility], is calling to confirm the dose of his Metoprolol Tartrate once daily. Is confirming that this is taken once daily vs the normally prescribed BID. Dr. Odella Aquas is requesting a call back on his personal cell phone. [phone]. Renaldo Fiddler, CMA

## 2015-12-29 ENCOUNTER — Telehealth: Payer: Self-pay | Admitting: Internal Medicine

## 2015-12-29 DIAGNOSIS — E785 Hyperlipidemia, unspecified: Secondary | ICD-10-CM

## 2015-12-29 DIAGNOSIS — E119 Type 2 diabetes mellitus without complications: Secondary | ICD-10-CM

## 2015-12-29 DIAGNOSIS — I10 Essential (primary) hypertension: Secondary | ICD-10-CM

## 2015-12-29 DIAGNOSIS — E034 Atrophy of thyroid (acquired): Secondary | ICD-10-CM

## 2015-12-29 NOTE — Telephone Encounter (Signed)
He is due for fasting labs, which I will order. He needs to make an appt to discuss results

## 2015-12-29 NOTE — Telephone Encounter (Signed)
Please schedule fasting labs and then an appt a few days later to discuss results. thanks

## 2015-12-29 NOTE — Telephone Encounter (Signed)
Labs last checked in December 2016.  Spoke with patient, normally with Glipizide the BLood sugar is in the 115-120's and lately it has been in 160-170's.  Currently takes 1/2 pill in am and 1/2 pill in evening.  Would like his A1C checked.  thanks

## 2015-12-29 NOTE — Telephone Encounter (Signed)
Ok. Pt is scheduled for fasting labs and a follow up appt for lab results.

## 2015-12-29 NOTE — Telephone Encounter (Signed)
See message below °

## 2015-12-29 NOTE — Telephone Encounter (Signed)
Pt called and left a message on office vm. He says that his blood surgar levels are going up. He thinks he needs his A1C checked. Please call pt at [phone].  Thanks

## 2015-12-31 ENCOUNTER — Encounter: Payer: Self-pay | Admitting: Internal Medicine

## 2015-12-31 ENCOUNTER — Other Ambulatory Visit (INDEPENDENT_AMBULATORY_CARE_PROVIDER_SITE_OTHER): Payer: PPO

## 2015-12-31 DIAGNOSIS — E038 Other specified hypothyroidism: Secondary | ICD-10-CM | POA: Diagnosis not present

## 2015-12-31 DIAGNOSIS — E034 Atrophy of thyroid (acquired): Secondary | ICD-10-CM

## 2015-12-31 DIAGNOSIS — E119 Type 2 diabetes mellitus without complications: Secondary | ICD-10-CM | POA: Diagnosis not present

## 2015-12-31 DIAGNOSIS — E785 Hyperlipidemia, unspecified: Secondary | ICD-10-CM | POA: Diagnosis not present

## 2015-12-31 DIAGNOSIS — I10 Essential (primary) hypertension: Secondary | ICD-10-CM

## 2015-12-31 LAB — LIPID PANEL
Cholesterol: 169 mg/dL (ref 0–200)
HDL: 48.4 mg/dL (ref >39.00)
LDL CALC: 97 mg/dL (ref 0–99)
NONHDL: 120.97
Total CHOL/HDL Ratio: 3
Triglycerides: 122 mg/dL (ref 0.0–149.0)
VLDL: 24.4 mg/dL (ref 0.0–40.0)

## 2015-12-31 LAB — COMPREHENSIVE METABOLIC PANEL
ALK PHOS: 66 U/L (ref 39–117)
ALT: 20 U/L (ref 0–53)
AST: 24 U/L (ref 0–37)
Albumin: 4.2 g/dL (ref 3.5–5.2)
BILIRUBIN TOTAL: 0.9 mg/dL (ref 0.2–1.2)
BUN: 23 mg/dL (ref 6–23)
CALCIUM: 9.5 mg/dL (ref 8.4–10.5)
CO2: 27 meq/L (ref 19–32)
CREATININE: 1.07 mg/dL (ref 0.40–1.50)
Chloride: 105 mEq/L (ref 96–112)
GFR: 72.05 mL/min (ref >60.00)
Glucose, Bld: 117 mg/dL — ABNORMAL HIGH (ref 70–99)
Potassium: 4.4 mEq/L (ref 3.5–5.1)
Sodium: 139 mEq/L (ref 135–145)
TOTAL PROTEIN: 7.1 g/dL (ref 6.0–8.3)

## 2015-12-31 LAB — TSH: TSH: 2.33 u[IU]/mL (ref 0.35–4.50)

## 2015-12-31 LAB — MICROALBUMIN / CREATININE URINE RATIO
Creatinine,U: 224.3 mg/dL
Microalb Creat Ratio: 0.3 mg/g (ref 0.0–30.0)

## 2015-12-31 LAB — HEMOGLOBIN A1C: Hgb A1c MFr Bld: 6.7 % — ABNORMAL HIGH (ref 4.6–6.5)

## 2015-12-31 LAB — LDL CHOLESTEROL, DIRECT: LDL DIRECT: 108 mg/dL

## 2016-01-12 LAB — HM DIABETES EYE EXAM

## 2016-01-14 NOTE — Telephone Encounter (Signed)
Mailed unread message to patient. thanks 

## 2016-01-23 ENCOUNTER — Ambulatory Visit (INDEPENDENT_AMBULATORY_CARE_PROVIDER_SITE_OTHER): Payer: PPO | Admitting: Internal Medicine

## 2016-01-23 ENCOUNTER — Encounter: Payer: Self-pay | Admitting: Internal Medicine

## 2016-01-23 DIAGNOSIS — E785 Hyperlipidemia, unspecified: Secondary | ICD-10-CM | POA: Diagnosis not present

## 2016-01-23 DIAGNOSIS — I10 Essential (primary) hypertension: Secondary | ICD-10-CM

## 2016-01-23 DIAGNOSIS — R911 Solitary pulmonary nodule: Secondary | ICD-10-CM

## 2016-01-23 DIAGNOSIS — E119 Type 2 diabetes mellitus without complications: Secondary | ICD-10-CM

## 2016-01-23 NOTE — Progress Notes (Signed)
Subjective:  Patient ID: Brett [name], male    DOB: 1940-07-27  Age: 75 y.o. MRN: [id number]  CC: Diagnoses of Essential hypertension, Hyperlipidemia LDL goal <70, Diabetes mellitus without complication ([place]), and Pulmonary nodule [name] on imaging study were pertinent to this visit.  HPI Brett [name] presents for follow up on Type 2 DM, hyperlipidemia and hypertension.  He feels generally well, is exercising several times per week and checking blood sugars once daily at variable times.  BS have been under 130 fasting and < 150 post prandially.  Denies any recent hypoglyemic events.  Taking his medications as directed. Following a carbohydrate modified diet 6 days per week. Denies numbness, burning and tingling of extremities. Appetite is good.    Went to [facility] in March with new wife of 2 years, Estell Harpin   HAS been having allergic rhinitis .  Using flonase.  Some dry mouth attributed to tamsulosin  Last eye exam one moth ago at the [facility] hospital NO DR  Foot exam normal.    Lab Results  Component Value Date   HGBA1C 6.7 (H) 12/31/2015   Lab Results  Component Value Date   CHOL 169 12/31/2015   HDL 48.40 12/31/2015   LDLCALC 97 12/31/2015   LDLDIRECT 108.0 12/31/2015   TRIG 122.0 12/31/2015   CHOLHDL 3 12/31/2015      Outpatient Medications Prior to Visit  Medication Sig Dispense Refill  . aspirin 81 MG tablet Take 1 tablet (81 mg total) by mouth daily. 30 tablet 11  . atorvastatin (LIPITOR) 80 MG tablet Take 1 tablet (80 mg total) by mouth daily. 90 tablet 3  . FREESTYLE LITE test strip CHECK BLOOD SUGAR DAILY AS DIRECTED 100 each 11  . glipiZIDE (GLUCOTROL) 5 MG tablet TAKE 1 TABLET (5 MG TOTAL) BY MOUTH DAILY BEFORE BREAKFAST. 90 tablet 2  . glucose monitoring kit (FREESTYLE) monitoring kit 1 each by Does not apply route as needed for other. For use daily to monitor diabetes.  Please include lancets .  Test once daily   E11.9 1 each 3  . Lancets (FREESTYLE) lancets Use as  instructed 100 each 12  . levothyroxine (SYNTHROID, LEVOTHROID) 112 MCG tablet Take 1 tablet (112 mcg total) by mouth daily. 90 tablet 3  . losartan (COZAAR) 25 MG tablet TAKE 1 TABLET (25 MG TOTAL) BY MOUTH DAILY. 90 tablet 2  . metoprolol tartrate (LOPRESSOR) 25 MG tablet Take 25 mg by mouth 2 (two) times daily.    . tamsulosin (FLOMAX) 0.4 MG CAPS capsule Take 0.4 mg by mouth.    . venlafaxine (EFFEXOR) 75 MG tablet Take 75 mg by mouth daily.     No facility-administered medications prior to visit.     Review of Systems;  Patient denies headache, fevers, malaise, unintentional weight loss, skin rash, eye pain, sinus congestion and sinus pain, sore throat, dysphagia,  hemoptysis , cough, dyspnea, wheezing, chest pain, palpitations, orthopnea, edema, abdominal pain, nausea, melena, diarrhea, constipation, flank pain, dysuria, hematuria, urinary  Frequency, nocturia, numbness, tingling, seizures,  Focal weakness, Loss of consciousness,  Tremor, insomnia, depression, anxiety, and suicidal ideation.      Objective:  BP 128/64   Pulse (!) 53   Temp 98.3 F (36.8 C) (Oral)   Resp 12   Ht 5' 11.5" (1.816 m)   Wt 192 lb 4 oz (87.2 kg)   SpO2 96%   BMI 26.44 kg/m   BP Readings from Last 3 Encounters:  01/23/16 128/64  07/07/15 120/72  06/06/15 122/62    Wt Readings from Last 3 Encounters:  01/23/16 192 lb 4 oz (87.2 kg)  07/07/15 194 lb 12 oz (88.3 kg)  06/06/15 194 lb 9.6 oz (88.3 kg)    General appearance: alert, cooperative and appears stated age Ears: normal TM's and external ear canals both ears Throat: lips, mucosa, and tongue normal; teeth and gums normal Neck: no adenopathy, no carotid bruit, supple, symmetrical, trachea midline and thyroid not enlarged, symmetric, no tenderness/mass/nodules Back: symmetric, no curvature. ROM normal. No CVA tenderness. Lungs: clear to auscultation bilaterally Heart: regular rate and rhythm, S1, S2 normal, no murmur, click, rub or  gallop Abdomen: soft, non-tender; bowel sounds normal; no masses,  no organomegaly Pulses: 2+ and symmetric Skin: Skin color, texture, turgor normal. No rashes or lesions Lymph nodes: Cervical, supraclavicular, and axillary nodes normal.  Lab Results  Component Value Date   HGBA1C 6.7 (H) 12/31/2015   HGBA1C 6.9 (H) 06/06/2015   HGBA1C 6.4 12/19/2014    Lab Results  Component Value Date   CREATININE 1.07 12/31/2015   CREATININE 1.01 06/06/2015   CREATININE 0.98 01/17/2015    Lab Results  Component Value Date   WBC 5.6 11/22/2013   HGB 13.8 11/22/2013   HCT 41.3 11/22/2013   PLT 265.0 11/22/2013   GLUCOSE 117 (H) 12/31/2015   CHOL 169 12/31/2015   TRIG 122.0 12/31/2015   HDL 48.40 12/31/2015   LDLDIRECT 108.0 12/31/2015   LDLCALC 97 12/31/2015   ALT 20 12/31/2015   AST 24 12/31/2015   NA 139 12/31/2015   K 4.4 12/31/2015   CL 105 12/31/2015   CREATININE 1.07 12/31/2015   BUN 23 12/31/2015   CO2 27 12/31/2015   TSH 2.33 12/31/2015   PSA 0.67 11/22/2013   HGBA1C 6.7 (H) 12/31/2015   MICROALBUR <0.7 12/31/2015    Ct Chest Wo Contrast  Result Date: 07/01/2015 CLINICAL DATA:  Follow-up of pulmonary nodule. EXAM: CT CHEST WITHOUT CONTRAST TECHNIQUE: Multidetector CT imaging of the chest was performed following the standard protocol without IV contrast. COMPARISON:  01/07/2012 FINDINGS: Mediastinum/Lymph Nodes: No masses or pathologically enlarged lymph nodes identified on this un-enhanced exam. The heart is normal in size. Postsurgical changes from CABG are [name]. Calcific atherosclerotic disease of the coronary arteries and aorta are noted. Lungs/Pleura: The left lower lobe subpleural solid pulmonary mass measures 2.0 by 2.7 by 2.2 cm. There is an associated linear subpleural thickening. A second subpleural soft tissue mass in the lateral aspect of the left upper lobe measures 1.7 by 1.1 by 2.0 cm. Wispy spicules extend from this mass to the left upper lobe bronchus, not  significantly changed. Cylindrical bronchiectasis is [name] in the left lower lobe and left upper lobe. There is moderate stable left lower lobe volume loss. Linear and ground-glass opacities with peripheral prominence in the right lower lobe are also unchanged. Upper abdomen: No acute findings. Cholelithiasis without evidence of acute cholecystitis. Musculoskeletal: No chest wall mass or suspicious bone lesions identified. IMPRESSION: Slightly smaller subpleural left lower lobe mass, and an essentially stable subpleural left upper lobe mass. Given their stability for over 3 years, benign process is favored. A follow-up in 1 year is recommended. Cholelithiasis. Electronically Signed   By: Fidela Salisbury M.D.   On: 07/01/2015 13:30    Assessment & Plan:   Problem List Items Addressed This Visit    Hypertension    Well controlled on current regimen. Renal function stable, no changes today.  Lab Results  Component Value  Date   CREATININE 1.07 12/31/2015   Lab Results  Component Value Date   NA 139 12/31/2015   K 4.4 12/31/2015   CL 105 12/31/2015   CO2 27 12/31/2015         Diabetes mellitus without complication ([place])    Currently well-controlled on current medications .  hemoglobin A1c is at goal of less than 7.0 . Patient is reminded to schedule an annual eye exam and foot exam is normal today. Patient has no microalbuminuria. Patient is tolerating statin therapy for CAD risk reduction and on ACE/ARB for renal protection and hypertension   Lab Results  Component Value Date   HGBA1C 6.7 (H) 12/31/2015   Lab Results  Component Value Date   MICROALBUR <0.7 12/31/2015   Lab Results  Component Value Date   CHOL 169 12/31/2015   HDL 48.40 12/31/2015   LDLCALC 97 12/31/2015   LDLDIRECT 108.0 12/31/2015   TRIG 122.0 12/31/2015   CHOLHDL 3 12/31/2015         Hyperlipidemia LDL goal <70    Well controlled on 80  Mg Lipitor. .   Liver enzymes are normal , no changes  today.  Lab Results  Component Value Date   CHOL 169 12/31/2015   HDL 48.40 12/31/2015   LDLCALC 97 12/31/2015   LDLDIRECT 108.0 12/31/2015   TRIG 122.0 12/31/2015   CHOLHDL 3 12/31/2015   Lab Results  Component Value Date   ALT 20 12/31/2015   AST 24 12/31/2015   ALKPHOS 66 12/31/2015   BILITOT 0.9 12/31/2015             Pulmonary nodule [name] on imaging study    Stable by 3 year follow up CT Jan 2017.  Repeat CT in Jan 2018 advised.        Other Visit Diagnoses   None.     I am having Mr. Kassim maintain his aspirin, venlafaxine, metoprolol tartrate, atorvastatin, tamsulosin, levothyroxine, glucose monitoring kit, freestyle, FREESTYLE LITE, losartan, and glipiZIDE.  No orders of the defined types were placed in this encounter.   There are no discontinued medications.  Follow-up: Return in about 6 months (around 07/25/2016) for wellness.   Crecencio Mc, MD

## 2016-01-23 NOTE — Progress Notes (Signed)
Pre-visit discussion using our clinic review tool. No additional management support is needed unless otherwise documented below in the visit note.  

## 2016-01-23 NOTE — Patient Instructions (Signed)
Your diabetes remains under excellent control  And your cholesterol and other labs are also normal. Please continue your current medications. return in 6 months for follow up on diabetes and make sure you are seeing your eye doctor at least once a year.   Lab Results  Component Value Date   HGBA1C 6.7 (H) 12/31/2015   Lab Results  Component Value Date   CHOL 169 12/31/2015   HDL 48.40 12/31/2015   LDLCALC 97 12/31/2015   LDLDIRECT 108.0 12/31/2015   TRIG 122.0 12/31/2015   CHOLHDL 3 12/31/2015

## 2016-01-25 NOTE — Assessment & Plan Note (Signed)
Well controlled on current regimen. Renal function stable, no changes today.  Lab Results  Component Value Date   CREATININE 1.07 12/31/2015   Lab Results  Component Value Date   NA 139 12/31/2015   K 4.4 12/31/2015   CL 105 12/31/2015   CO2 27 12/31/2015

## 2016-01-25 NOTE — Assessment & Plan Note (Signed)
Well controlled on 80  Mg Lipitor. .   Liver enzymes are normal , no changes today.  Lab Results  Component Value Date   CHOL 169 12/31/2015   HDL 48.40 12/31/2015   LDLCALC 97 12/31/2015   LDLDIRECT 108.0 12/31/2015   TRIG 122.0 12/31/2015   CHOLHDL 3 12/31/2015   Lab Results  Component Value Date   ALT 20 12/31/2015   AST 24 12/31/2015   ALKPHOS 66 12/31/2015   BILITOT 0.9 12/31/2015

## 2016-01-25 NOTE — Assessment & Plan Note (Signed)
Currently well-controlled on current medications .  hemoglobin A1c is at goal of less than 7.0 . Patient is reminded to schedule an annual eye exam and foot exam is normal today. Patient has no microalbuminuria. Patient is tolerating statin therapy for CAD risk reduction and on ACE/ARB for renal protection and hypertension   Lab Results  Component Value Date   HGBA1C 6.7 (H) 12/31/2015   Lab Results  Component Value Date   MICROALBUR <0.7 12/31/2015   Lab Results  Component Value Date   CHOL 169 12/31/2015   HDL 48.40 12/31/2015   LDLCALC 97 12/31/2015   LDLDIRECT 108.0 12/31/2015   TRIG 122.0 12/31/2015   CHOLHDL 3 12/31/2015

## 2016-01-25 NOTE — Assessment & Plan Note (Signed)
Stable by 3 year follow up CT Jan 2017.  Repeat CT in Jan 2018 advised.  

## 2016-02-18 ENCOUNTER — Telehealth: Payer: Self-pay | Admitting: *Deleted

## 2016-02-18 MED ORDER — METOPROLOL TARTRATE 25 MG PO TABS: 25.0000 mg | ORAL_TABLET | Freq: Two times a day (BID) | ORAL | 5 refills | Status: DC

## 2016-02-18 NOTE — Telephone Encounter (Signed)
Refilled, thanks

## 2016-02-18 NOTE — Telephone Encounter (Signed)
Patient has requested a medication refill for metoprolol Pharmacy [facility] [address]

## 2016-02-26 ENCOUNTER — Encounter: Payer: Self-pay | Admitting: Cardiovascular Disease

## 2016-03-11 ENCOUNTER — Telehealth: Payer: Self-pay | Admitting: Cardiovascular Disease

## 2016-03-11 NOTE — Telephone Encounter (Signed)
New message     Patient wants to transition over to [facility] office to see Dr. Rockey Situ - due to home address.

## 2016-03-11 NOTE — Telephone Encounter (Signed)
I will miss him but I understand OK to transfer to [facility]

## 2016-03-15 ENCOUNTER — Ambulatory Visit: Payer: PPO | Admitting: Cardiovascular Disease

## 2016-04-04 ENCOUNTER — Other Ambulatory Visit: Payer: Self-pay | Admitting: Internal Medicine

## 2016-04-12 ENCOUNTER — Ambulatory Visit (INDEPENDENT_AMBULATORY_CARE_PROVIDER_SITE_OTHER): Payer: PPO | Admitting: Cardiovascular Disease

## 2016-04-12 ENCOUNTER — Encounter: Payer: Self-pay | Admitting: Cardiovascular Disease

## 2016-04-12 VITALS — BP 140/78 | Ht 71.0 in | Wt 192.5 lb

## 2016-04-12 DIAGNOSIS — E119 Type 2 diabetes mellitus without complications: Secondary | ICD-10-CM | POA: Diagnosis not present

## 2016-04-12 DIAGNOSIS — I10 Essential (primary) hypertension: Secondary | ICD-10-CM | POA: Diagnosis not present

## 2016-04-12 DIAGNOSIS — I2510 Atherosclerotic heart disease of native coronary artery without angina pectoris: Secondary | ICD-10-CM

## 2016-04-12 DIAGNOSIS — E785 Hyperlipidemia, unspecified: Secondary | ICD-10-CM

## 2016-04-12 DIAGNOSIS — I25709 Atherosclerosis of coronary artery bypass graft(s), unspecified, with unspecified angina pectoris: Secondary | ICD-10-CM

## 2016-04-12 MED ORDER — EZETIMIBE 10 MG PO TABS: 10.0000 mg | ORAL_TABLET | Freq: Every day | ORAL | 3 refills | Status: DC

## 2016-04-12 NOTE — Patient Instructions (Signed)
Medication Instructions:   Please start zetia one a day for cholesterol  Please decrease the metoprolol down to 1/2 pill twice a day  Labwork:  No new labs needed  Testing/Procedures:  No further testing at this time   Follow-Up: It was a pleasure seeing you in the office today. Please call us if you have new issues that need to be addressed before your next appt.  [phone]  Your physician wants you to follow-up in: 6 months.  You will receive a reminder letter in the mail two months in advance. If you don't receive a letter, please call our office to schedule the follow-up appointment.  If you need a refill on your cardiac medications before your next appointment, please call your pharmacy.

## 2016-04-12 NOTE — Progress Notes (Signed)
Cardiology Office Note  Date:  04/12/2016   ID:  [name], [name] [birth year], MRN [id number]  PCP:  Crecencio Mc, MD   Chief Complaint  Patient presents with  . Other    Former Ecologist pt. Tranfering.    HPI:  [name] is a very pleasant 75 year old gentleman with history of htn, DM II, HBA1C 6.7, Hyperlipidemia, total chol 169, LDL 97 CAD, CABG x4 at [facility]  in 2006, previous stents Who presents for routine follow-up of his coronary artery disease  In general he reports that he feels well with no complaints Denies any shortness of breath or chest pain on exertion Walks 5x a week, 2 miles  No change in exertional capacity  He works as a Mudlogger for the hospital  Takes Lipitor 40 mg daily Reports that he takes metoprolol once a day LDL above goal  EKG on today's visit shows sinus bradycardia rate 55 bpm, old inferior MI, poor R-wave progression to the anterior precordial leads   PMH:   has a past medical history of Allergy; Calculus of ureter; Coronary artery disease; Diabetes mellitus; H/O renal calculi (2012); Hematuria, unspecified; Hyperlipidemia; Hypertension; Impotence of organic origin; Myocardial infarction; Peyronie's disease; Pulmonary nodule seen on imaging study (2012); Swelling, mass, or lump in chest; and Unspecified hypothyroidism.  PSH:    Past Surgical History:  Procedure Laterality Date  . CARDIAC CATHETERIZATION  2001   [facility] x3 stent  . COLONOSCOPY    . CORONARY ARTERY BYPASS GRAFT  2006   4 vessel, after 3 or 4 stents   . CORONARY ARTERY BYPASS GRAFT    . PENILE PROSTHESIS IMPLANT      Current Outpatient Prescriptions  Medication Sig Dispense Refill  . aspirin 81 MG tablet Take 1 tablet (81 mg total) by mouth daily. 30 tablet 11  . atorvastatin (LIPITOR) 80 MG tablet Take 1 tablet (80 mg total) by mouth daily. 90 tablet 3  . ezetimibe (ZETIA) 10 MG tablet Take 1 tablet (10 mg total) by mouth daily. 90 tablet  3  . FREESTYLE LITE test strip CHECK BLOOD SUGAR DAILY AS DIRECTED 100 each 11  . glipiZIDE (GLUCOTROL) 5 MG tablet TAKE 1 TABLET (5 MG TOTAL) BY MOUTH DAILY BEFORE BREAKFAST. 90 tablet 2  . glucose monitoring kit (FREESTYLE) monitoring kit 1 each by Does not apply route as needed for other. For use daily to monitor diabetes.  Please include lancets .  Test once daily   E11.9 1 each 3  . Lancets (FREESTYLE) lancets Use as instructed 100 each 12  . levothyroxine (SYNTHROID, LEVOTHROID) 112 MCG tablet Take 1 tablet (112 mcg total) by mouth daily. 90 tablet 3  . losartan (COZAAR) 25 MG tablet TAKE 1 TABLET (25 MG TOTAL) BY MOUTH DAILY. 90 tablet 2  . metoprolol tartrate (LOPRESSOR) 25 MG tablet Take 1 tablet (25 mg total) by mouth 2 (two) times daily. 60 tablet 5  . tamsulosin (FLOMAX) 0.4 MG CAPS capsule Take 0.4 mg by mouth.    . venlafaxine (EFFEXOR) 75 MG tablet Take 75 mg by mouth daily.     No current facility-administered medications for this visit.      Allergies:   Patient has no known allergies.   Social History:  The patient  reports that he has never smoked. He has never used smokeless tobacco. He reports that he drinks about 0.6 oz of alcohol per week . He reports that he does not use drugs.  Family History:   family history includes AAA (abdominal aortic aneurysm) in his mother; Heart disease (age of onset: 71) in his father; Hyperlipidemia in his mother.    Review of Systems: Review of Systems  Constitutional: Negative.   Respiratory: Negative.   Cardiovascular: Negative.   Gastrointestinal: Negative.   Musculoskeletal: Negative.   Neurological: Negative.   Psychiatric/Behavioral: Negative.   All other systems reviewed and are negative.    PHYSICAL EXAM: VS:  BP 140/78   Ht 5' 11" (1.803 m)   Wt 192 lb 8 oz (87.3 kg)   BMI 26.85 kg/m  , BMI Body mass index is 26.85 kg/m. GEN: Well nourished, well developed, in no acute distress  HEENT: normal  Neck: no JVD,  carotid bruits, or masses Cardiac: RRR; no murmurs, rubs, or gallops,no edema  Respiratory:  clear to auscultation bilaterally, normal work of breathing GI: soft, nontender, nondistended, + BS MS: no deformity or atrophy  Skin: warm and dry, no rash Neuro:  Strength and sensation are intact Psych: euthymic mood, full affect    Recent Labs: 12/31/2015: ALT 20; BUN 23; Creatinine, Ser 1.07; Potassium 4.4; Sodium 139; TSH 2.33    Lipid Panel Lab Results  Component Value Date   CHOL 169 12/31/2015   HDL 48.40 12/31/2015   LDLCALC 97 12/31/2015   TRIG 122.0 12/31/2015      Wt Readings from Last 3 Encounters:  04/12/16 192 lb 8 oz (87.3 kg)  01/23/16 192 lb 4 oz (87.2 kg)  07/07/15 194 lb 12 oz (88.3 kg)     ASSESSMENT AND PLAN:  Essential hypertension - Plan: EKG 12-Lead Blood pressure is well controlled on today's visit. No changes made to the medications.'  Multiple vessel coronary artery disease Currently with no symptoms of angina. No further workup at this time. Continue current medication regimen.  Coronary artery disease involving coronary bypass graft of native heart with angina pectoris (HCC) - Plan: EKG 12-Lead  Diabetes mellitus without complication (HCC) We have encouraged continued exercise, careful diet management in an effort to lose weight.  Hyperlipidemia LDL goal <70 Discussed various treatment options with him including no changes to his medications, increasing his Lipitor up to 80 mg daily or adding Zetia He prefers to add zetia 10 mg daily Prescription sent in   Total encounter time more than 25 minutes  Greater than 50% was spent in counseling and coordination of care with the patient   Disposition:   F/U  6 months   Orders Placed This Encounter  Procedures  . EKG 12-Lead     Signed, Tim Gollan, M.D., Ph.D. 04/12/2016  [facility], [place] [phone]   

## 2016-04-13 ENCOUNTER — Telehealth: Payer: Self-pay | Admitting: Cardiovascular Disease

## 2016-04-13 MED ORDER — EZETIMIBE 10 MG PO TABS: 10.0000 mg | ORAL_TABLET | Freq: Every day | ORAL | 3 refills | Status: DC

## 2016-04-13 NOTE — Telephone Encounter (Signed)
Rx printed.  Awaiting Dr. Donivan Scull signature.

## 2016-04-13 NOTE — Telephone Encounter (Signed)
CVD patient. Will route to [facility].

## 2016-04-13 NOTE — Telephone Encounter (Signed)
Rx is ready.  Left message for pt that I am leaving at the front desk for him to p/u at his convenience. Asked him to call back w/ any questions or concerns.

## 2016-04-13 NOTE — Telephone Encounter (Signed)
Patient needs a written Rx for ezetimibe (ZETIA) 10 MG tablet. Due to the expense, so he can go to different Pharmacies for price comparison.

## 2016-04-22 ENCOUNTER — Other Ambulatory Visit: Payer: Self-pay | Admitting: Internal Medicine

## 2016-05-06 ENCOUNTER — Ambulatory Visit: Payer: PPO

## 2016-05-06 ENCOUNTER — Ambulatory Visit (INDEPENDENT_AMBULATORY_CARE_PROVIDER_SITE_OTHER): Payer: PPO | Admitting: Internal Medicine

## 2016-05-06 ENCOUNTER — Encounter: Payer: Self-pay | Admitting: Internal Medicine

## 2016-05-06 VITALS — BP 132/84 | HR 48 | Temp 97.6°F | Resp 12 | Ht 71.0 in | Wt 191.5 lb

## 2016-05-06 DIAGNOSIS — E119 Type 2 diabetes mellitus without complications: Secondary | ICD-10-CM | POA: Diagnosis not present

## 2016-05-06 DIAGNOSIS — Z1211 Encounter for screening for malignant neoplasm of colon: Secondary | ICD-10-CM | POA: Diagnosis not present

## 2016-05-06 DIAGNOSIS — Z13820 Encounter for screening for osteoporosis: Secondary | ICD-10-CM | POA: Diagnosis not present

## 2016-05-06 DIAGNOSIS — R351 Nocturia: Secondary | ICD-10-CM

## 2016-05-06 DIAGNOSIS — E559 Vitamin D deficiency, unspecified: Secondary | ICD-10-CM | POA: Diagnosis not present

## 2016-05-06 DIAGNOSIS — Z0000 Encounter for general adult medical examination without abnormal findings: Secondary | ICD-10-CM

## 2016-05-06 DIAGNOSIS — E034 Atrophy of thyroid (acquired): Secondary | ICD-10-CM

## 2016-05-06 DIAGNOSIS — N401 Enlarged prostate with lower urinary tract symptoms: Secondary | ICD-10-CM

## 2016-05-06 LAB — COMPREHENSIVE METABOLIC PANEL
ALBUMIN: 4.3 g/dL (ref 3.5–5.2)
ALT: 27 U/L (ref 0–53)
AST: 26 U/L (ref 0–37)
Alkaline Phosphatase: 66 U/L (ref 39–117)
BUN: 22 mg/dL (ref 6–23)
CALCIUM: 9.2 mg/dL (ref 8.4–10.5)
CHLORIDE: 104 meq/L (ref 96–112)
CO2: 29 mEq/L (ref 19–32)
Creatinine, Ser: 1.01 mg/dL (ref 0.40–1.50)
GFR: 76.94 mL/min (ref >60.00)
Glucose, Bld: 159 mg/dL — ABNORMAL HIGH (ref 70–99)
POTASSIUM: 4.3 meq/L (ref 3.5–5.1)
SODIUM: 139 meq/L (ref 135–145)
Total Bilirubin: 0.6 mg/dL (ref 0.2–1.2)
Total Protein: 7.1 g/dL (ref 6.0–8.3)

## 2016-05-06 LAB — VITAMIN D 25 HYDROXY (VIT D DEFICIENCY, FRACTURES): VITD: 27.85 ng/mL — AB (ref 30.00–100.00)

## 2016-05-06 LAB — HEMOGLOBIN A1C: Hgb A1c MFr Bld: 6.8 % — ABNORMAL HIGH (ref 4.6–6.5)

## 2016-05-06 NOTE — Patient Instructions (Signed)

## 2016-05-06 NOTE — Progress Notes (Signed)
Patient ID: Brett [name], male    DOB: 09/06/1940  Age: 75 y.o. MRN: [id number]  The patient is here for routine wellness examination and management of other chronic and acute problems.   He is overdue for 5 yr follow up on colonoscopy 2016 ,  Requested to be done by [facility]  The risk factors are reflected in the social history.  The roster of all physicians providing medical care to patient - is listed in the Snapshot section of the chart.  Activities of daily living:  The patient is 100% independent in all ADLs: dressing, toileting, feeding as well as independent mobility  Home safety : The patient has smoke detectors in the home. They wear seatbelts.  There are no firearms at home. There is no violence in the home.   There is no risks for hepatitis, STDs or HIV. There is no   history of blood transfusion. They have no travel history to infectious disease endemic areas of the world.  The patient has seen their dentist in the last six month. They have seen their eye doctor in the last year. They admit to slight hearing difficulty with regard to whispered voices and some television programs.  They have deferred audiologic testing in the last year.  They do not  have excessive sun exposure. Discussed the need for sun protection: hats, long sleeves and use of sunscreen if there is significant sun exposure.   Diet: the importance of a healthy diet is discussed. They do have a healthy diet.  The benefits of regular aerobic exercise were discussed. he walks 4 times per week ,  30 minutes.   Depression screen: there are no signs or vegative symptoms of depression- irritability, change in appetite, anhedonia, sadness/tearfullness.  Cognitive assessment: the patient manages all their financial and personal affairs and is actively engaged. They could relate day,date,year and events; recalled 2/3 objects at 3 minutes; performed clock-face test normally.  The following portions of the patient's history  were reviewed and updated as appropriate: allergies, current medications, past family history, past medical history,  past surgical history, past social history  and problem list.  Visual acuity was not assessed per patient preference since she has regular follow up with her ophthalmologist. Hearing and body mass index were assessed and reviewed.   During the course of the visit the patient was educated and counseled about appropriate screening and preventive services including : fall prevention , diabetes screening, nutrition counseling, colorectal cancer screening, and recommended immunizations.    CC: The primary encounter diagnosis was Screening for colon cancer. Diagnoses of Vitamin D deficiency, Diabetes mellitus without complication (HCC), Screening for osteoporosis, Routine general medical examination at a health care facility, Hypothyroidism due to acquired atrophy of thyroid, and Benign prostatic hyperplasia with nocturia were also pertinent to this visit.  3 month follow up on diabetes.  Patient has no complaints today.  Patient is following a low glycemic index diet and taking all prescribed medications regularly without side effects.  Fasting sugars have been under less than 140 most of the time . Patient is exercising about 3 times per week and intentionally trying to lose weight .  Patient has had an eye exam in the last 12 months and checks feet regularly for signs of infection.  Patient does not walk barefoot outside,  And denies an numbness tingling or burning in feet. Patient is up to date on all recommended vaccinations  Dr Gollan added Zetia for elevated LDL of 97,  Still   taking Lipitor. He started the medication a week ago ,  Had some muscle pain with walking which has resolved.     History Brett [name] has a past medical history of Allergy; Calculus of ureter; Coronary artery disease; Diabetes mellitus; H/O renal calculi (2012); Hematuria, unspecified; Hyperlipidemia; Hypertension;  Impotence of organic origin; Myocardial infarction; Peyronie's disease; Pulmonary nodule seen on imaging study (2012); Swelling, mass, or lump in chest; and Unspecified hypothyroidism.   He has a past surgical history that includes Colonoscopy; Penile prosthesis implant; Cardiac catheterization (2001); Coronary artery bypass graft (2006); and Coronary artery bypass graft.   His family history includes AAA (abdominal aortic aneurysm) in his mother; Heart disease (age of onset: 71) in his father; Hyperlipidemia in his mother.He reports that he has never smoked. He has never used smokeless tobacco. He reports that he drinks about 0.6 oz of alcohol per week . He reports that he does not use drugs.  Outpatient Medications Prior to Visit  Medication Sig Dispense Refill  . aspirin 81 MG tablet Take 1 tablet (81 mg total) by mouth daily. 30 tablet 11  . atorvastatin (LIPITOR) 80 MG tablet Take 1 tablet (80 mg total) by mouth daily. 90 tablet 3  . ezetimibe (ZETIA) 10 MG tablet Take 1 tablet (10 mg total) by mouth daily. 90 tablet 3  . FREESTYLE LITE test strip CHECK BLOOD SUGAR DAILY AS DIRECTED 100 each 11  . glipiZIDE (GLUCOTROL) 5 MG tablet TAKE 1 TABLET (5 MG TOTAL) BY MOUTH DAILY BEFORE BREAKFAST. 90 tablet 2  . glucose monitoring kit (FREESTYLE) monitoring kit 1 each by Does not apply route as needed for other. For use daily to monitor diabetes.  Please include lancets .  Test once daily   E11.9 1 each 3  . Lancets (FREESTYLE) lancets Use as instructed 100 each 12  . levothyroxine (SYNTHROID, LEVOTHROID) 112 MCG tablet Take 1 tablet (112 mcg total) by mouth daily. 90 tablet 3  . losartan (COZAAR) 25 MG tablet TAKE 1 TABLET (25 MG TOTAL) BY MOUTH DAILY. 90 tablet 2  . metoprolol tartrate (LOPRESSOR) 25 MG tablet Take 1 tablet (25 mg total) by mouth 2 (two) times daily. 60 tablet 5  . tamsulosin (FLOMAX) 0.4 MG CAPS capsule TAKE 1 CAPSULE EVERY DAY 90 capsule 1  . venlafaxine (EFFEXOR) 75 MG tablet  Take 75 mg by mouth daily.     No facility-administered medications prior to visit.     Review of Systems   Patient denies headache, fevers, malaise, unintentional weight loss, skin rash, eye pain, sinus congestion and sinus pain, sore throat, dysphagia,  hemoptysis , cough, dyspnea, wheezing, chest pain, palpitations, orthopnea, edema, abdominal pain, nausea, melena, diarrhea, constipation, flank pain, dysuria, hematuria, urinary  Frequency, nocturia, numbness, tingling, seizures,  Focal weakness, Loss of consciousness,  Tremor, insomnia, depression, anxiety, and suicidal ideation.     Objective:  BP 132/84   Pulse (!) 48   Temp 97.6 F (36.4 C) (Oral)   Resp 12   Ht 5' 11" (1.803 m)   Wt 191 lb 8 oz (86.9 kg)   SpO2 98%   BMI 26.71 kg/m   Physical Exam   General appearance: alert, cooperative and appears stated age Ears: normal TM's and external ear canals both ears Throat: lips, mucosa, and tongue normal; teeth and gums normal Neck: no adenopathy, no carotid bruit, supple, symmetrical, trachea midline and thyroid not enlarged, symmetric, no tenderness/mass/nodules Back: symmetric, no curvature. ROM normal. No CVA tenderness. Lungs: clear to auscultation   bilaterally Heart: regular rate and rhythm, S1, S2 normal, no murmur, click, rub or gallop Abdomen: soft, non-tender; bowel sounds normal; no masses,  no organomegaly Pulses: 2+ and symmetric Skin: Skin color, texture, turgor normal. No rashes or lesions Lymph nodes: Cervical, supraclavicular, and axillary nodes normal.    Assessment & Plan:   Problem List Items Addressed This Visit    Benign prostatic hyperplasia with nocturia    Managed by [facility],  psa due feb 2018      Diabetes mellitus without complication (HCC)    Currently well-controlled on current medications .  hemoglobin A1c is at goal of less than 7.0 . Patient is reminded to schedule an annual eye exam and foot exam is normal today. Patient has no  microalbuminuria. Patient is tolerating statin therapy for CAD risk reduction and on ACE/ARB for renal protection and hypertension   Lab Results  Component Value Date   HGBA1C 6.8 (H) 05/06/2016   Lab Results  Component Value Date   MICROALBUR <0.7 12/31/2015   Lab Results  Component Value Date   CHOL 169 12/31/2015   HDL 48.40 12/31/2015   LDLCALC 97 12/31/2015   LDLDIRECT 108.0 12/31/2015   TRIG 122.0 12/31/2015   CHOLHDL 3 12/31/2015         Relevant Orders   Hemoglobin A1c (Completed)   Comprehensive metabolic panel (Completed)   Hypothyroidism    Thyroid function is WNL on current dose.  No current changes needed.   Lab Results  Component Value Date   TSH 2.33 12/31/2015         Routine general medical examination at a health care facility    Annual comprehensive preventive exam was done as well as an evaluation and management of acute and chronic conditions .  During the course of the visit the patient was educated and counseled about appropriate screening and preventive services including :  diabetes screening, lipid analysis with projected  10 year  risk for CAD , nutrition counseling, prostate and colorectal cancer screening, and recommended immunizations.  Printed recommendations for health maintenance screenings was given.        Other Visit Diagnoses    Screening for colon cancer    -  Primary   Relevant Orders   Ambulatory referral to Gastroenterology   Vitamin D deficiency       Relevant Orders   VITAMIN D 25 Hydroxy (Vit-D Deficiency, Fractures) (Completed)   Screening for osteoporosis       Relevant Orders   DG Bone Density      I am having Mr. Bittman maintain his aspirin, venlafaxine, atorvastatin, levothyroxine, glucose monitoring kit, freestyle, FREESTYLE LITE, glipiZIDE, metoprolol tartrate, losartan, ezetimibe, and tamsulosin.  No orders of the defined types were placed in this encounter.   There are no discontinued  medications.  Follow-up: Return in about 6 months (around 11/03/2016) for follow up diabetes.   ,  L, MD  

## 2016-05-06 NOTE — Progress Notes (Signed)
Pre-visit discussion using our clinic review tool. No additional management support is needed unless otherwise documented below in the visit note.  

## 2016-05-09 NOTE — Assessment & Plan Note (Signed)
Managed by [facility],  psa due feb 2018

## 2016-05-09 NOTE — Assessment & Plan Note (Signed)

## 2016-05-09 NOTE — Assessment & Plan Note (Signed)
Thyroid function is WNL on current dose.  No current changes needed.   Lab Results  Component Value Date   TSH 2.33 12/31/2015

## 2016-05-09 NOTE — Assessment & Plan Note (Signed)
Currently well-controlled on current medications .  hemoglobin A1c is at goal of less than 7.0 . Patient is reminded to schedule an annual eye exam and foot exam is normal today. Patient has no microalbuminuria. Patient is tolerating statin therapy for CAD risk reduction and on ACE/ARB for renal protection and hypertension   Lab Results  Component Value Date   HGBA1C 6.8 (H) 05/06/2016   Lab Results  Component Value Date   MICROALBUR <0.7 12/31/2015   Lab Results  Component Value Date   CHOL 169 12/31/2015   HDL 48.40 12/31/2015   LDLCALC 97 12/31/2015   LDLDIRECT 108.0 12/31/2015   TRIG 122.0 12/31/2015   CHOLHDL 3 12/31/2015

## 2016-05-11 ENCOUNTER — Encounter: Payer: Self-pay | Admitting: *Deleted

## 2016-05-11 ENCOUNTER — Ambulatory Visit (INDEPENDENT_AMBULATORY_CARE_PROVIDER_SITE_OTHER): Payer: PPO

## 2016-05-11 VITALS — BP 122/62 | HR 55 | Temp 97.7°F | Resp 12 | Ht 70.5 in | Wt 193.1 lb

## 2016-05-11 DIAGNOSIS — Z0000 Encounter for general adult medical examination without abnormal findings: Secondary | ICD-10-CM

## 2016-05-11 NOTE — Patient Instructions (Addendum)
[name] , Thank you for taking time to come for your Medicare Wellness Visit. I appreciate your ongoing commitment to your health goals. Please review the following plan we discussed and let me know if I can assist you in the future.   FOLLOW UP WITH DR. Derrel Nip AS NEEDED.  These are the goals we discussed: Goals    . Healthy Lifestyle          Stay active and continue walking for exercise. Golf in the spring. Stay hydrated and drink plenty of fluids. Low carb foods.  Vegetables, lean meats.       This is a list of the screening recommended for you and due dates:  Health Maintenance  Topic Date Due  . Shingles Vaccine  07/07/2016*  . Hemoglobin A1C  11/03/2016  . Eye exam for diabetics  01/11/2017  . Complete foot exam   01/22/2017  . Colon Cancer Screening  10/05/2019  . Tetanus Vaccine  03/17/2023  . Flu Shot  Completed  . Pneumonia vaccines  Completed  *Topic was postponed. The date shown is not the original due date.      Diabetes and Foot Care Diabetes may cause you to have problems because of poor blood supply (circulation) to your feet and legs. This may cause the skin on your feet to become thinner, break easier, and heal more slowly. Your skin may become dry, and the skin may peel and crack. You may also have nerve damage in your legs and feet causing decreased feeling in them. You may not notice minor injuries to your feet that could lead to infections or more serious problems. Taking care of your feet is one of the most important things you can do for yourself. Follow these instructions at home:  Wear shoes at all times, even in the house. Do not go barefoot. Bare feet are easily injured.  Check your feet daily for blisters, cuts, and redness. If you cannot see the bottom of your feet, use a mirror or ask someone for help.  Wash your feet with warm water (do not use hot water) and mild soap. Then pat your feet and the areas between your toes until they are  completely dry. Do not soak your feet as this can dry your skin.  Apply a moisturizing lotion or petroleum jelly (that does not contain alcohol and is unscented) to the skin on your feet and to dry, brittle toenails. Do not apply lotion between your toes.  Trim your toenails straight across. Do not dig under them or around the cuticle. File the edges of your nails with an emery board or nail file.  Do not cut corns or calluses or try to remove them with medicine.  Wear clean socks or stockings every day. Make sure they are not too tight. Do not wear knee-high stockings since they may decrease blood flow to your legs.  Wear shoes that fit properly and have enough cushioning. To break in new shoes, wear them for just a few hours a day. This prevents you from injuring your feet. Always look in your shoes before you put them on to be sure there are no objects inside.  Do not cross your legs. This may decrease the blood flow to your feet.  If you find a minor scrape, cut, or break in the skin on your feet, keep it and the skin around it clean and dry. These areas may be cleansed with mild soap and water. Do not cleanse  the area with peroxide, alcohol, or iodine.  When you remove an adhesive bandage, be sure not to damage the skin around it.  If you have a wound, look at it several times a day to make sure it is healing.  Do not use heating pads or hot water bottles. They may burn your skin. If you have lost feeling in your feet or legs, you may not know it is happening until it is too late.  Make sure your health care provider performs a complete foot exam at least annually or more often if you have foot problems. Report any cuts, sores, or bruises to your health care provider immediately. Contact a health care provider if:  You have an injury that is not healing.  You have cuts or breaks in the skin.  You have an ingrown nail.  You notice redness on your legs or feet.  You feel burning  or tingling in your legs or feet.  You have pain or cramps in your legs and feet.  Your legs or feet are numb.  Your feet always feel cold. Get help right away if:  There is increasing redness, swelling, or pain in or around a wound.  There is a red line that goes up your leg.  Pus is coming from a wound.  You develop a fever or as directed by your health care provider.  You notice a bad smell coming from an ulcer or wound. This information is not intended to replace advice given to you by your health care provider. Make sure you discuss any questions you have with your health care provider. Document Released: 05/21/2000 Document Revised: 10/30/2015 Document Reviewed: 10/31/2012 Elsevier Interactive Patient Education  2017 [employer].

## 2016-05-11 NOTE — Progress Notes (Signed)
Subjective:   Brett [name] is a 75 y.o. male who presents for Medicare Annual/Subsequent preventive examination.  Review of Systems:  No ROS.  Medicare Wellness Visit.  Cardiac Risk Factors include: advanced age (>61mn, >>70women);hypertension;male gender     Objective:    Vitals: BP 122/62 (BP Location: Left Arm, Patient Position: Sitting, Cuff Size: Normal)   Pulse (!) 55   Temp 97.7 F (36.5 C) (Oral)   Resp 12   Ht 5' 10.5" (1.791 m)   Wt 193 lb 1.9 oz (87.6 kg)   SpO2 95%   BMI 27.32 kg/m   Body mass index is 27.32 kg/m.  Tobacco History  Smoking Status  . Never Smoker  Smokeless Tobacco  . Never Used     Counseling given: Not Answered   Past Medical History:  Diagnosis Date  . Allergy    resolved after CABG  . Calculus of ureter   . Coronary artery disease   . Diabetes mellitus   . H/O renal calculi 2012  . Hematuria, unspecified   . Hyperlipidemia   . Hypertension   . Impotence of organic origin   . Myocardial infarction   . Peyronie's disease   . Pulmonary nodule seen on imaging study 2012  . Swelling, mass, or lump in chest   . Unspecified hypothyroidism    Past Surgical History:  Procedure Laterality Date  . CARDIAC CATHETERIZATION  2001   [facility] x3 stent  . COLONOSCOPY    . CORONARY ARTERY BYPASS GRAFT  2006   4 vessel, after 3 or 4 stents   . CORONARY ARTERY BYPASS GRAFT    . PENILE PROSTHESIS IMPLANT     Family History  Problem Relation Age of Onset  . Hyperlipidemia Mother   . AAA (abdominal aortic aneurysm) Mother   . Heart disease Father 779  History  Sexual Activity  . Sexual activity: Not Currently    Outpatient Encounter Prescriptions as of 05/11/2016  Medication Sig  . aspirin 81 MG tablet Take 1 tablet (81 mg total) by mouth daily.  .Marland Kitchenatorvastatin (LIPITOR) 80 MG tablet Take 1 tablet (80 mg total) by mouth daily.  .Marland Kitchenezetimibe (ZETIA) 10 MG tablet Take 1 tablet (10 mg total) by mouth daily.  .Marland KitchenFREESTYLE  LITE test strip CHECK BLOOD SUGAR DAILY AS DIRECTED  . glipiZIDE (GLUCOTROL) 5 MG tablet TAKE 1 TABLET (5 MG TOTAL) BY MOUTH DAILY BEFORE BREAKFAST.  .Marland Kitchenglucose monitoring kit (FREESTYLE) monitoring kit 1 each by Does not apply route as needed for other. For use daily to monitor diabetes.  Please include lancets .  Test once daily   E11.9  . Lancets (FREESTYLE) lancets Use as instructed  . levothyroxine (SYNTHROID, LEVOTHROID) 112 MCG tablet Take 1 tablet (112 mcg total) by mouth daily.  .Marland Kitchenlosartan (COZAAR) 25 MG tablet TAKE 1 TABLET (25 MG TOTAL) BY MOUTH DAILY.  . metoprolol tartrate (LOPRESSOR) 25 MG tablet Take 1 tablet (25 mg total) by mouth 2 (two) times daily.  . tamsulosin (FLOMAX) 0.4 MG CAPS capsule TAKE 1 CAPSULE EVERY DAY  . venlafaxine (EFFEXOR) 75 MG tablet Take 75 mg by mouth daily.   No facility-administered encounter medications on file as of 05/11/2016.     Activities of Daily Living In your present state of health, do you have any difficulty performing the following activities: 05/11/2016  Hearing? N  Vision? N  Difficulty concentrating or making decisions? N  Walking or climbing stairs? N  Dressing  or bathing? N  Doing errands, shopping? N  Preparing Food and eating ? N  Using the Toilet? N  In the past six months, have you accidently leaked urine? N  Do you have problems with loss of bowel control? N  Managing your Medications? N  Managing your Finances? N  Housekeeping or managing your Housekeeping? N  Some recent data might be hidden    Patient Care Team: Crecencio Mc, MD as PCP - General (Internal Medicine) Minna Merritts, MD as Consulting Physician (Cardiology)   Assessment:    This is a routine wellness examination for Jamian. The goal of the wellness visit is to assist the patient how to close the gaps in care and create a preventative care plan for the patient.   Osteoporosis reviewed.   Medications reviewed; taking without issues or  barriers.  Safety issues reviewed; lives with wife.  Smoke detectors in the home. No firearms in the home. Wears seatbelts when driving or riding with others. No violence in the home.  No identified risk were noted; The patient was oriented x 3; appropriate in dress and manner and no objective failures at ADL's or IADL's.   BMI; discussed the importance of a healthy diet, water intake and exercise. He has a sensible diet, adequate water intake and walks for exercise.  Plans to increase activity by golfing in the spring.   Educational material provided.  Health maintenance gaps; closed.  Patient Concerns: Bilateral calf cramping; mild.  Noticed when  walking/climbing stairs which he believes started when he started taking Zetia x1 month ago.  He will continue to monitor symptoms and follow up with PCP as needed.     Exercise Activities and Dietary recommendations Current Exercise Habits: Home exercise routine, Type of exercise: walking, Time (Minutes): 60, Frequency (Times/Week): 2, Weekly Exercise (Minutes/Week): 120, Intensity: Moderate  Goals    . Healthy Lifestyle          Stay active and continue walking for exercise. Golf in the spring. Stay hydrated and drink plenty of fluids. Low carb foods.  Vegetables, lean meats.      Fall Risk Fall Risk  05/06/2016 05/07/2015 05/30/2014 05/08/2012  Falls in the past year? No No No No   Depression Screen PHQ 2/9 Scores 05/06/2016 05/07/2015 05/30/2014 05/08/2012  PHQ - 2 Score 0 2 0 2  PHQ- 9 Score - 3 - 4    Cognitive Function MMSE - Mini Mental State Exam 05/07/2015  Orientation to time 5  Orientation to Place 5  Registration 3  Attention/ Calculation 5  Recall 3  Language- name 2 objects 2  Language- repeat 1  Language- follow 3 step command 3  Language- read & follow direction 1  Write a sentence 1  Copy design 1  Total score 30     6CIT Screen 05/11/2016  What Year? 0 points  What month? 0 points  What time? 0  points  Count back from 20 0 points  Months in reverse 0 points  Repeat phrase 0 points  Total Score 0    Immunization History  Administered Date(s) Administered  . Influenza Split 03/16/2013, 02/11/2015  . Influenza Whole 02/14/2012  . Influenza-Unspecified 02/05/2013, 02/11/2015, 02/27/2016  . Pneumococcal Conjugate-13 03/16/2013  . Pneumococcal Polysaccharide-23 07/08/2009, 06/25/2015  . Tdap 03/16/2013   Screening Tests Health Maintenance  Topic Date Due  . ZOSTAVAX  07/07/2016 (Originally 03/28/2003)  . HEMOGLOBIN A1C  11/03/2016  . OPHTHALMOLOGY EXAM  01/11/2017  . FOOT EXAM  01/22/2017  . COLONOSCOPY  10/05/2019  . TETANUS/TDAP  03/17/2023  . INFLUENZA VACCINE  Completed  . PNA vac Low Risk Adult  Completed      Plan:    End of life planning; Advance aging; Advanced directives discussed. Copy of current HCPOA/Living Will requested.  Medicare Attestation I have personally reviewed: The patient's medical and social history Their use of alcohol, tobacco or illicit drugs Their current medications and supplements The patient's functional ability including ADLs,fall risks, home safety risks, cognitive, and hearing and visual impairment Diet and physical activities Evidence for depression   The patient's weight, height, BMI, and visual acuity have been recorded in the chart.  I have made referrals and provided education to the patient based on review of the above and I have provided the patient with a written personalized care plan for preventive services.    During the course of the visit the patient was educated and counseled about the following appropriate screening and preventive services:   Vaccines to include Pneumoccal, Influenza, Hepatitis B, Td, Zostavax, HCV  Electrocardiogram  Cardiovascular Disease  Colorectal cancer screening  Diabetes screening  Prostate Cancer Screening  Glaucoma screening  Nutrition counseling   Smoking cessation  counseling  Patient Instructions (the written plan) was given to the patient.    Varney Biles, LPN  [id number]

## 2016-05-12 ENCOUNTER — Ambulatory Visit: Payer: PPO

## 2016-05-18 NOTE — Progress Notes (Signed)
  I have reviewed the above information and agree with above.   Jadis Pitter, MD 

## 2016-07-08 ENCOUNTER — Other Ambulatory Visit: Payer: Self-pay | Admitting: Internal Medicine

## 2016-07-15 ENCOUNTER — Other Ambulatory Visit: Payer: Self-pay | Admitting: Internal Medicine

## 2016-08-31 ENCOUNTER — Other Ambulatory Visit: Payer: Self-pay

## 2016-08-31 MED ORDER — METOPROLOL TARTRATE 25 MG PO TABS: 25.0000 mg | ORAL_TABLET | Freq: Two times a day (BID) | ORAL | 0 refills | Status: DC

## 2016-09-03 LAB — HEMOGLOBIN A1C: Hemoglobin A1C: 6.9

## 2016-09-06 DIAGNOSIS — J40 Bronchitis, not specified as acute or chronic: Secondary | ICD-10-CM | POA: Diagnosis not present

## 2016-09-06 DIAGNOSIS — B9689 Other specified bacterial agents as the cause of diseases classified elsewhere: Secondary | ICD-10-CM | POA: Diagnosis not present

## 2016-09-06 DIAGNOSIS — J019 Acute sinusitis, unspecified: Secondary | ICD-10-CM | POA: Diagnosis not present

## 2016-09-13 DIAGNOSIS — N401 Enlarged prostate with lower urinary tract symptoms: Secondary | ICD-10-CM | POA: Diagnosis not present

## 2016-09-13 LAB — PSA: PSA: 0.46

## 2016-09-16 ENCOUNTER — Other Ambulatory Visit: Payer: Self-pay | Admitting: Internal Medicine

## 2016-09-16 LAB — CBC AND DIFFERENTIAL
HEMOGLOBIN: 14.3 g/dL (ref 13.5–17.5)
PLATELETS: 189 10*3/uL (ref 150–399)
WBC: 5.6 10^3/mL

## 2016-09-16 LAB — LIPID PANEL
CHOLESTEROL: 150 mg/dL (ref 0–200)
HDL: 65 mg/dL (ref 35–70)
LDL CALC: 85 mg/dL
TRIGLYCERIDES: 168 mg/dL — AB (ref 40–160)

## 2016-09-16 LAB — HEPATIC FUNCTION PANEL
ALK PHOS: 87 U/L (ref 25–125)
ALT: 38 U/L (ref 10–40)
AST: 32 U/L (ref 14–40)
BILIRUBIN, TOTAL: 0.6 mg/dL

## 2016-09-16 LAB — BASIC METABOLIC PANEL
BUN: 24 mg/dL — AB (ref 4–21)
Creatinine: 1.1 mg/dL (ref 0.6–1.3)
Glucose: 160 mg/dL
Potassium: 4.2 mmol/L (ref 3.4–5.3)
SODIUM: 140 mmol/L (ref 137–147)

## 2016-09-16 LAB — HEMOGLOBIN A1C: Hemoglobin A1C: 6.9

## 2016-09-16 NOTE — Telephone Encounter (Signed)
Refilled

## 2016-09-16 NOTE — Telephone Encounter (Signed)
Refilled: 12/22/2015 Last OV: 05/06/2016 Next OV: 11/03/2016 Last Labs: 05/06/2017

## 2016-09-20 DIAGNOSIS — R351 Nocturia: Secondary | ICD-10-CM | POA: Diagnosis not present

## 2016-09-20 DIAGNOSIS — N5201 Erectile dysfunction due to arterial insufficiency: Secondary | ICD-10-CM | POA: Diagnosis not present

## 2016-10-09 NOTE — Progress Notes (Signed)
Cardiology Office Note  Date:  10/11/2016   ID:  Brett [name], Brett [name] [birth year], MRN [id number]  PCP:  Crecencio Mc, MD   Chief Complaint  Patient presents with  . other    6 month follow up. Patient c/o left thigh pain and cold feet.  Patient states he is doing well.  Meds reviewed verbally with patient.     HPI:  Brett [name] is a very pleasant 76 year old gentleman with history of  HTN,   DM II, HBA1C 6.7,  Hyperlipidemia, total chol 169, LDL 97 CAD,  CABG [facility]4 at [facility]  in 2006,  previous stents Who presents for routine follow-up of his coronary artery disease  In follow-up he is very active, Spending lots of time with his grandchildren in [place] They are playing soccer amongst other games  Denies any shortness of breath or chest pain on exertion Walks 5x a week, 2 miles  No change in exertional capacity  He works as a Mudlogger for the hospital  Takes Lipitor 40 mg daily Reports that he takes metoprolol once a day LDL above goal  Previous labs reviewed personally by myself and with the patient on todays visit  new number showing LDL 87, total cholesterol 150 Reports that he did not tolerate Zetia. This for one week was playing soccer and felt some cramping in his leg and stopped the medication  EKG on today's visit shows sinus bradycardia rate 57 bpm, old inferior MI, T wave ABN precordial leads   PMH:   has a past medical history of Allergy; Calculus of ureter; Coronary artery disease; Diabetes mellitus; H/O renal calculi (2012); Hematuria, unspecified; Hyperlipidemia; Hypertension; Impotence of organic origin; Myocardial infarction ([place]); Peyronie's disease; Pulmonary nodule seen on imaging study (2012); Swelling, mass, or lump in chest; and Unspecified hypothyroidism.  PSH:    Past Surgical History:  Procedure Laterality Date  . CARDIAC CATHETERIZATION  2001   [facility] x3 stent  . COLONOSCOPY    . CORONARY ARTERY BYPASS GRAFT   2006   4 vessel, after 3 or 4 stents   . CORONARY ARTERY BYPASS GRAFT    . PENILE PROSTHESIS IMPLANT      Current Outpatient Prescriptions  Medication Sig Dispense Refill  . aspirin 81 MG tablet Take 1 tablet (81 mg total) by mouth daily. 30 tablet 11  . atorvastatin (LIPITOR) 80 MG tablet Take 1 tablet (80 mg total) by mouth daily. 90 tablet 3  . FREESTYLE LITE test strip CHECK BLOOD SUGAR DAILY AS DIRECTED 100 each 11  . glipiZIDE (GLUCOTROL) 5 MG tablet TAKE 1 TABLET (5 MG TOTAL) BY MOUTH DAILY BEFORE BREAKFAST. 90 tablet 0  . glucose monitoring kit (FREESTYLE) monitoring kit 1 each by Does not apply route as needed for other. For use daily to monitor diabetes.  Please include lancets .  Test once daily   E11.9 1 each 3  . Lancets (FREESTYLE) lancets Use as instructed 100 each 12  . levothyroxine (SYNTHROID, LEVOTHROID) 112 MCG tablet TAKE 1 TABLET BY MOUTH EVERY DAY 90 tablet 1  . losartan (COZAAR) 25 MG tablet TAKE 1 TABLET (25 MG TOTAL) BY MOUTH DAILY. 90 tablet 2  . metoprolol tartrate (LOPRESSOR) 25 MG tablet Take 1 tablet (25 mg total) by mouth 2 (two) times daily. 180 tablet 0  . tamsulosin (FLOMAX) 0.4 MG CAPS capsule TAKE 1 CAPSULE EVERY DAY 90 capsule 1  . venlafaxine (EFFEXOR) 75 MG tablet Take 75 mg by mouth daily.  No current facility-administered medications for this visit.      Allergies:   Patient has no allergy information on record.   Social History:  The patient  reports that he has never smoked. He has never used smokeless tobacco. He reports that he drinks about 0.6 oz of alcohol per week . He reports that he does not use drugs.   Family History:   family history includes AAA (abdominal aortic aneurysm) in his mother; Heart disease (age of onset: 47) in his father; Hyperlipidemia in his mother.    Review of Systems: Review of Systems  Constitutional: Negative.   Respiratory: Negative.   Cardiovascular: Negative.   Gastrointestinal: Negative.    Musculoskeletal: Negative.   Neurological: Negative.   Psychiatric/Behavioral: Negative.   All other systems reviewed and are negative.    PHYSICAL EXAM: VS:  BP 120/60 (BP Location: Left Arm, Patient Position: Sitting, Cuff Size: Normal)   Pulse (!) 57   Ht 5' 11"  (1.803 m)   Wt 186 lb 4 oz (84.5 kg)   BMI 25.98 kg/m  , BMI Body mass index is 25.98 kg/m. GEN: Well nourished, well developed, in no acute distress  HEENT: normal  Neck: no JVD, carotid bruits, or masses Cardiac: RRR; no murmurs, rubs, or gallops,no edema  Respiratory:  clear to auscultation bilaterally, normal work of breathing GI: soft, nontender, nondistended, + BS MS: no deformity or atrophy  Skin: warm and dry, no rash Neuro:  Strength and sensation are intact Psych: euthymic mood, full affect    Recent Labs: 12/31/2015: TSH 2.33 05/06/2016: ALT 27; BUN 22; Creatinine, Ser 1.01; Potassium 4.3; Sodium 139    Lipid Panel Lab Results  Component Value Date   CHOL 169 12/31/2015   HDL 48.40 12/31/2015   LDLCALC 97 12/31/2015   TRIG 122.0 12/31/2015      Wt Readings from Last 3 Encounters:  10/11/16 186 lb 4 oz (84.5 kg)  05/11/16 193 lb 1.9 oz (87.6 kg)  05/06/16 191 lb 8 oz (86.9 kg)     ASSESSMENT AND PLAN:   Essential hypertension - Plan: EKG 12-Lead Blood pressure is well controlled on today's visit. No changes made to the medications.  Multiple vessel coronary artery disease Currently with no symptoms of angina. No further workup at this time. Continue current medication regimen.  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    Signed, Esmond Plants, M.D., Ph.D. 10/11/2016  [facility] [place], [place] [id number]

## 2016-10-11 ENCOUNTER — Ambulatory Visit (INDEPENDENT_AMBULATORY_CARE_PROVIDER_SITE_OTHER): Payer: PPO | Admitting: Cardiovascular Disease

## 2016-10-11 ENCOUNTER — Encounter: Payer: Self-pay | Admitting: Cardiovascular Disease

## 2016-10-11 VITALS — BP 120/60 | HR 57 | Ht 71.0 in | Wt 186.2 lb

## 2016-10-11 DIAGNOSIS — E119 Type 2 diabetes mellitus without complications: Secondary | ICD-10-CM

## 2016-10-11 DIAGNOSIS — I25708 Atherosclerosis of coronary artery bypass graft(s), unspecified, with other forms of angina pectoris: Secondary | ICD-10-CM | POA: Diagnosis not present

## 2016-10-11 DIAGNOSIS — E785 Hyperlipidemia, unspecified: Secondary | ICD-10-CM

## 2016-10-11 DIAGNOSIS — I10 Essential (primary) hypertension: Secondary | ICD-10-CM

## 2016-10-11 NOTE — Patient Instructions (Signed)
Medication Instructions:   No medication changes made  Consider retrying 1/2 dose zetia or every other day  Labwork:  No new labs needed  Testing/Procedures:  No further testing at this time   I recommend watching educational videos on topics of interest to you at:       [email or web address]  Enter code: HEARTCARE    Follow-Up: It was a pleasure seeing you in the office today. Please call us if you have new issues that need to be addressed before your next appt.  [phone]  Your physician wants you to follow-up in: 6 months.  You will receive a reminder letter in the mail two months in advance. If you don't receive a letter, please call our office to schedule the follow-up appointment.  If you need a refill on your cardiac medications before your next appointment, please call your pharmacy.

## 2016-10-21 ENCOUNTER — Other Ambulatory Visit: Payer: Self-pay | Admitting: Internal Medicine

## 2016-10-21 DIAGNOSIS — Z86010 Personal history of colonic polyps: Secondary | ICD-10-CM | POA: Diagnosis not present

## 2016-11-03 ENCOUNTER — Ambulatory Visit (INDEPENDENT_AMBULATORY_CARE_PROVIDER_SITE_OTHER): Payer: PPO | Admitting: Internal Medicine

## 2016-11-03 ENCOUNTER — Encounter: Payer: Self-pay | Admitting: Internal Medicine

## 2016-11-03 VITALS — BP 128/78 | HR 61 | Temp 98.2°F | Resp 16 | Ht 71.0 in | Wt 184.6 lb

## 2016-11-03 DIAGNOSIS — E119 Type 2 diabetes mellitus without complications: Secondary | ICD-10-CM | POA: Diagnosis not present

## 2016-11-03 DIAGNOSIS — R911 Solitary pulmonary nodule: Secondary | ICD-10-CM

## 2016-11-03 DIAGNOSIS — R490 Dysphonia: Secondary | ICD-10-CM | POA: Diagnosis not present

## 2016-11-03 DIAGNOSIS — I10 Essential (primary) hypertension: Secondary | ICD-10-CM | POA: Diagnosis not present

## 2016-11-03 DIAGNOSIS — R499 Unspecified voice and resonance disorder: Secondary | ICD-10-CM

## 2016-11-03 MED ORDER — LEVOFLOXACIN 500 MG PO TABS: 500.0000 mg | ORAL_TABLET | Freq: Every day | ORAL | 0 refills | Status: DC

## 2016-11-03 MED ORDER — PROMETHAZINE HCL 12.5 MG PO TABS: 12.5000 mg | ORAL_TABLET | Freq: Three times a day (TID) | ORAL | 0 refills | Status: DC | PRN

## 2016-11-03 NOTE — Progress Notes (Signed)
Subjective:  Patient ID: Brett [name] Seen, male    DOB: December 31, 1940  Age: 76 y.o. MRN: [id number]  CC: The primary encounter diagnosis was Hoarseness of voice. Diagnoses of Diabetes mellitus without complication ([place]), Essential hypertension, Change in voice, and Pulmonary nodule seen on imaging study were also pertinent to this visit.  HPI Brett [name] presents for 6 month follow up on type 2 DM, hyperlipidemia .  Patient has no complaints today.  Patient is following a low glycemic index diet and taking all prescribed medications regularly without side effects.  Fasting sugars have been under less than 140 most of the time and post prandials have been under 160 except on rare occasions. Patient is walking  about 4 times per week and intentionally trying to lose weight .  Patient has had an eye exam in the last year and has no retinopathy .he checks feet regularly for signs of infection.  Patient does not walk barefoot outside,  And denies an numbness tingling or burning in feet. Patient is up to date on all recommended vaccinations.   He had labs done recently at the [facility] hospital and his a1c was 6.9   His weight goal is 180    Saw Urology for prostate exam and PSA. Was done.    Foot exam done by the doctor at the [facility] .   Circulation   Voice has been getting more hoarse   No history of tobacco smoking or dipping  No symptoms of reflux,  Chronic sinus drainage.  was treated over 4 months ago for sinusitis /laryngitis and has hoarseness has not resolved. .  No probiotic      Outpatient Medications Prior to Visit  Medication Sig Dispense Refill  . aspirin 81 MG tablet Take 1 tablet (81 mg total) by mouth daily. 30 tablet 11  . atorvastatin (LIPITOR) 80 MG tablet Take 1 tablet (80 mg total) by mouth daily. 90 tablet 3  . FREESTYLE LITE test strip CHECK BLOOD SUGAR DAILY AS DIRECTED 100 each 11  . glipiZIDE (GLUCOTROL) 5 MG tablet TAKE 1 TABLET (5 MG TOTAL) BY MOUTH DAILY BEFORE BREAKFAST. 90  tablet 0  . glucose monitoring kit (FREESTYLE) monitoring kit 1 each by Does not apply route as needed for other. For use daily to monitor diabetes.  Please include lancets .  Test once daily   E11.9 1 each 3  . Lancets (FREESTYLE) lancets Use as instructed 100 each 12  . levothyroxine (SYNTHROID, LEVOTHROID) 112 MCG tablet TAKE 1 TABLET BY MOUTH EVERY DAY 90 tablet 1  . losartan (COZAAR) 25 MG tablet TAKE 1 TABLET (25 MG TOTAL) BY MOUTH DAILY. 90 tablet 2  . metoprolol tartrate (LOPRESSOR) 25 MG tablet Take 1 tablet (25 mg total) by mouth 2 (two) times daily. 180 tablet 0  . tamsulosin (FLOMAX) 0.4 MG CAPS capsule TAKE 1 CAPSULE EVERY DAY 90 capsule 1  . venlafaxine (EFFEXOR) 75 MG tablet Take 75 mg by mouth daily.     No facility-administered medications prior to visit.     Review of Systems;  Patient denies headache, fevers, malaise, unintentional weight loss, skin rash, eye pain, sinus congestion and sinus pain, sore throat, dysphagia,  hemoptysis , cough, dyspnea, wheezing, chest pain, palpitations, orthopnea, edema, abdominal pain, nausea, melena, diarrhea, constipation, flank pain, dysuria, hematuria, urinary  Frequency, nocturia, numbness, tingling, seizures,  Focal weakness, Loss of consciousness,  Tremor, insomnia, depression, anxiety, and suicidal ideation.      Objective:  BP 128/78 (BP  Location: Left Arm, Patient Position: Sitting, Cuff Size: Normal)   Pulse 61   Temp 98.2 F (36.8 C) (Oral)   Resp 16   Ht 5' 11"  (1.803 m)   Wt 184 lb 9.6 oz (83.7 kg)   SpO2 97%   BMI 25.75 kg/m   BP Readings from Last 3 Encounters:  11/03/16 128/78  10/11/16 120/60  05/11/16 122/62    Wt Readings from Last 3 Encounters:  11/03/16 184 lb 9.6 oz (83.7 kg)  10/11/16 186 lb 4 oz (84.5 kg)  05/11/16 193 lb 1.9 oz (87.6 kg)    General appearance: alert, cooperative and appears stated age Ears: normal TM's and external ear canals both ears Throat: lips, mucosa, and tongue normal;  teeth and gums normal Neck: no adenopathy, no carotid bruit, supple, symmetrical, trachea midline and thyroid not enlarged, symmetric, no tenderness/mass/nodules Back: symmetric, no curvature. ROM normal. No CVA tenderness. Lungs: clear to auscultation bilaterally Heart: regular rate and rhythm, S1, S2 normal, no murmur, click, rub or gallop Abdomen: soft, non-tender; bowel sounds normal; no masses,  no organomegaly Pulses: 2+ and symmetric Skin: Skin color, texture, turgor normal. No rashes or lesions Lymph nodes: Cervical, supraclavicular, and axillary nodes normal.  Lab Results  Component Value Date   HGBA1C 6.9 09/16/2016   HGBA1C 6.9 09/03/2016   HGBA1C 6.8 (H) 05/06/2016    Lab Results  Component Value Date   CREATININE 1.1 09/16/2016   CREATININE 1.01 05/06/2016   CREATININE 1.07 12/31/2015    Lab Results  Component Value Date   WBC 5.6 09/16/2016   HGB 14.3 09/16/2016   HCT 41.3 11/22/2013   PLT 189 09/16/2016   GLUCOSE 159 (H) 05/06/2016   CHOL 150 09/16/2016   TRIG 168 (A) 09/16/2016   HDL 65 09/16/2016   LDLDIRECT 108.0 12/31/2015   LDLCALC 85 09/16/2016   ALT 38 09/16/2016   AST 32 09/16/2016   NA 140 09/16/2016   K 4.2 09/16/2016   CL 104 05/06/2016   CREATININE 1.1 09/16/2016   BUN 24 (A) 09/16/2016   CO2 29 05/06/2016   TSH 2.33 12/31/2015   PSA 0.46 09/13/2016   HGBA1C 6.9 09/16/2016   MICROALBUR <0.7 12/31/2015    Assessment & Plan:   Problem List Items Addressed This Visit    Pulmonary nodule seen on imaging study    Stable by 3 year follow up CT Jan 2017.  Repeat CT in Jan 2018 advised.       Relevant Orders   CT CHEST NODULE FOLLOW UP LOW DOSE W/O   Hypertension    Well controlled on current regimen. Renal function stable, no changes today.      Diabetes mellitus without complication ([place])    Currently well-controlled on current medications .  hemoglobin A1c is at goal of less than 7.0 . Patient is reminded to schedule an annual eye  exam and foot exam is normal today. Patient has no microalbuminuria. Patient is tolerating statin therapy for CAD risk reduction and on ACE/ARB for renal protection and hypertension   Lab Results  Component Value Date   HGBA1C 6.9 09/03/2016   Lab Results  Component Value Date   MICROALBUR <0.7 12/31/2015   Lab Results  Component Value Date   CHOL 169 12/31/2015   HDL 48.40 12/31/2015   LDLCALC 97 12/31/2015   LDLDIRECT 108.0 12/31/2015   TRIG 122.0 12/31/2015   CHOLHDL 3 12/31/2015         Relevant Orders   Microalbumin /  creatinine urine ratio   Change in voice    He has had persistent hoarseness for 4 months ,  since a sinus infection.  Referral to ENT for evaluation        Other Visit Diagnoses    Hoarseness of voice    -  Primary   Relevant Orders   Ambulatory referral to ENT    A total of 25 minutes of face to face time was spent with patient more than half of which was spent in counselling about the above mentioned conditions  and coordination of care   I am having Mr. Varano start on levofloxacin and promethazine. I am also having him maintain his aspirin, venlafaxine, atorvastatin, glucose monitoring kit, freestyle, FREESTYLE LITE, losartan, levothyroxine, metoprolol tartrate, glipiZIDE, and tamsulosin.  Meds ordered this encounter  Medications  . levofloxacin (LEVAQUIN) 500 MG tablet    Sig: Take 1 tablet (500 mg total) by mouth daily.    Dispense:  14 tablet    Refill:  0  . promethazine (PHENERGAN) 12.5 MG tablet    Sig: Take 1 tablet (12.5 mg total) by mouth every 8 (eight) hours as needed for nausea or vomiting.    Dispense:  20 tablet    Refill:  0    There are no discontinued medications.  Follow-up: Return in about 6 months (around 05/06/2017) for follow up diabetes.   Crecencio Mc, MD

## 2016-11-03 NOTE — Patient Instructions (Addendum)
NO CHANGES WERE MADE TODAY SINCE YOUR DIABTES IS UNDER EXCELLENT CONTROL    Tell your wife to make an occasional fritatta for breakfast.  Much lower in crarbohydrates than oatmeal/blueberries .   I am sending you ot ENT to have your vocal cords looked at    Take the Levaquin and Phenergan with you on your cruise  The levaquin is an antibiotic and Will treat sinusitis,  Bronchitis,  And cystitis (bladder infection)  The phenergan treats nausea  Take immodium for diarrhea   REMEMBER::   Taking an antibiotic can create an imbalance in the normal population of bacteria that live in the small intestine.  This imbalance can persist for 3 months.   Taking a probiotic ( Align, Floraque or Culturelle), the generic version of one of these over the counter medications, or an alternative form (kombucha,  Yogurt, or another dietary source) for a minimum of 3 weeks may help prevent a serious antibiotic associated diarrhea  Called clostridium dificile colitis that occurs when the bacteria population is altered .  Taking a probiotic may also prevent vaginitis due to yeast infections and can be continued indefinitely if you feel that it improves your digestion or your elimination (bowels).

## 2016-11-06 DIAGNOSIS — R499 Unspecified voice and resonance disorder: Secondary | ICD-10-CM | POA: Insufficient documentation

## 2016-11-06 NOTE — Assessment & Plan Note (Signed)
Currently well-controlled on current medications .  hemoglobin A1c is at goal of less than 7.0 . Patient is reminded to schedule an annual eye exam and foot exam is normal today. Patient has no microalbuminuria. Patient is tolerating statin therapy for CAD risk reduction and on ACE/ARB for renal protection and hypertension   Lab Results  Component Value Date   HGBA1C 6.9 09/03/2016   Lab Results  Component Value Date   MICROALBUR <0.7 12/31/2015   Lab Results  Component Value Date   CHOL 169 12/31/2015   HDL 48.40 12/31/2015   LDLCALC 97 12/31/2015   LDLDIRECT 108.0 12/31/2015   TRIG 122.0 12/31/2015   CHOLHDL 3 12/31/2015

## 2016-11-06 NOTE — Assessment & Plan Note (Signed)
Well controlled on current regimen. Renal function stable, no changes today. 

## 2016-11-06 NOTE — Assessment & Plan Note (Signed)
Stable by 3 year follow up CT Jan 2017.  Repeat CT in Jan 2018 advised.

## 2016-11-06 NOTE — Assessment & Plan Note (Signed)
He has had persistent hoarseness for 4 months ,  since a sinus infection.  Referral to ENT for evaluation

## 2016-11-11 ENCOUNTER — Telehealth: Payer: Self-pay | Admitting: Internal Medicine

## 2016-11-11 NOTE — Telephone Encounter (Signed)
I called pt to let him know about the CT that was scheduled. He said that he did not need a CT. I told him it was for a chest CT for a nodule follow up. He said that he does not have any nodules. Please advise. Pt cb  [phone]

## 2016-11-17 ENCOUNTER — Ambulatory Visit: Payer: PPO

## 2016-11-22 ENCOUNTER — Ambulatory Visit
Admission: RE | Admit: 2016-11-22 | Discharge: 2016-11-22 | Disposition: A | Discharge status: Home / Self Care | Payer: PPO | Source: Ambulatory Visit | Attending: Internal Medicine | Admitting: Internal Medicine

## 2016-11-22 DIAGNOSIS — I700 Atherosclerosis of aorta: Secondary | ICD-10-CM | POA: Diagnosis not present

## 2016-11-22 DIAGNOSIS — R911 Solitary pulmonary nodule: Secondary | ICD-10-CM

## 2016-11-22 DIAGNOSIS — J984 Other disorders of lung: Secondary | ICD-10-CM | POA: Diagnosis not present

## 2016-11-23 ENCOUNTER — Encounter: Payer: Self-pay | Admitting: Internal Medicine

## 2016-11-24 ENCOUNTER — Encounter: Payer: Self-pay | Admitting: Internal Medicine

## 2016-11-29 DIAGNOSIS — R490 Dysphonia: Secondary | ICD-10-CM | POA: Diagnosis not present

## 2016-12-09 NOTE — Telephone Encounter (Signed)
Mailed unread message to patient, thanks 

## 2016-12-17 ENCOUNTER — Other Ambulatory Visit: Payer: Self-pay | Admitting: Internal Medicine

## 2016-12-24 ENCOUNTER — Other Ambulatory Visit: Payer: Self-pay | Admitting: Internal Medicine

## 2016-12-26 ENCOUNTER — Other Ambulatory Visit: Payer: Self-pay | Admitting: Internal Medicine

## 2016-12-26 DIAGNOSIS — E034 Atrophy of thyroid (acquired): Secondary | ICD-10-CM

## 2016-12-26 DIAGNOSIS — E119 Type 2 diabetes mellitus without complications: Secondary | ICD-10-CM

## 2016-12-27 NOTE — Telephone Encounter (Signed)
RefillED  for 30 days only.  NON FASTING LABS ORDERED AND NEEDED prior to any more refills

## 2016-12-27 NOTE — Telephone Encounter (Signed)
No TSH since 12/31/15 ok to fill 30 days and schedule patient .

## 2016-12-29 NOTE — Telephone Encounter (Signed)
Patient has been scheduled forrlabs.

## 2017-01-03 ENCOUNTER — Other Ambulatory Visit (INDEPENDENT_AMBULATORY_CARE_PROVIDER_SITE_OTHER): Payer: PPO

## 2017-01-03 DIAGNOSIS — E119 Type 2 diabetes mellitus without complications: Secondary | ICD-10-CM

## 2017-01-03 DIAGNOSIS — E034 Atrophy of thyroid (acquired): Secondary | ICD-10-CM

## 2017-01-03 LAB — COMPREHENSIVE METABOLIC PANEL
ALK PHOS: 75 U/L (ref 39–117)
ALT: 24 U/L (ref 0–53)
AST: 25 U/L (ref 0–37)
Albumin: 4.2 g/dL (ref 3.5–5.2)
BILIRUBIN TOTAL: 0.7 mg/dL (ref 0.2–1.2)
BUN: 19 mg/dL (ref 6–23)
CALCIUM: 9.1 mg/dL (ref 8.4–10.5)
CO2: 28 meq/L (ref 19–32)
Chloride: 102 mEq/L (ref 96–112)
Creatinine, Ser: 1.03 mg/dL (ref 0.40–1.50)
GFR: 75.08 mL/min (ref >60.00)
Glucose, Bld: 220 mg/dL — ABNORMAL HIGH (ref 70–99)
Potassium: 4.1 mEq/L (ref 3.5–5.1)
Sodium: 137 mEq/L (ref 135–145)
TOTAL PROTEIN: 7.4 g/dL (ref 6.0–8.3)

## 2017-01-03 LAB — HEMOGLOBIN A1C: Hgb A1c MFr Bld: 6.6 % — ABNORMAL HIGH (ref 4.6–6.5)

## 2017-01-03 LAB — TSH: TSH: 1.58 u[IU]/mL (ref 0.35–4.50)

## 2017-01-03 LAB — MICROALBUMIN / CREATININE URINE RATIO
Creatinine,U: 259.7 mg/dL
MICROALB UR: 1.1 mg/dL (ref 0.0–1.9)
Microalb Creat Ratio: 0.4 mg/g (ref 0.0–30.0)

## 2017-01-04 ENCOUNTER — Encounter: Payer: Self-pay | Admitting: Internal Medicine

## 2017-01-18 NOTE — Telephone Encounter (Signed)
Mailed unread message to patient, thanks 

## 2017-01-21 ENCOUNTER — Encounter: Payer: Self-pay | Admitting: *Deleted

## 2017-01-24 ENCOUNTER — Encounter: Payer: Self-pay | Admitting: *Deleted

## 2017-01-24 ENCOUNTER — Encounter
Admission: RE | Disposition: A | Discharge status: Home / Self Care | Payer: Self-pay | Source: Ambulatory Visit | Attending: Unknown Physician Specialty

## 2017-01-24 ENCOUNTER — Ambulatory Visit: Payer: PPO | Admitting: Anesthesiology

## 2017-01-24 ENCOUNTER — Ambulatory Visit
Admission: RE | Admit: 2017-01-24 | Discharge: 2017-01-24 | Disposition: A | Discharge status: Home / Self Care | Payer: PPO | Source: Ambulatory Visit | Attending: Unknown Physician Specialty | Admitting: Unknown Physician Specialty

## 2017-01-24 DIAGNOSIS — Z87891 Personal history of nicotine dependence: Secondary | ICD-10-CM | POA: Insufficient documentation

## 2017-01-24 DIAGNOSIS — Z1211 Encounter for screening for malignant neoplasm of colon: Secondary | ICD-10-CM | POA: Insufficient documentation

## 2017-01-24 DIAGNOSIS — I10 Essential (primary) hypertension: Secondary | ICD-10-CM | POA: Diagnosis not present

## 2017-01-24 DIAGNOSIS — Z7984 Long term (current) use of oral hypoglycemic drugs: Secondary | ICD-10-CM | POA: Insufficient documentation

## 2017-01-24 DIAGNOSIS — Z79899 Other long term (current) drug therapy: Secondary | ICD-10-CM | POA: Diagnosis not present

## 2017-01-24 DIAGNOSIS — E039 Hypothyroidism, unspecified: Secondary | ICD-10-CM | POA: Diagnosis not present

## 2017-01-24 DIAGNOSIS — Z7982 Long term (current) use of aspirin: Secondary | ICD-10-CM | POA: Insufficient documentation

## 2017-01-24 DIAGNOSIS — Z86010 Personal history of colonic polyps: Secondary | ICD-10-CM | POA: Insufficient documentation

## 2017-01-24 DIAGNOSIS — Z951 Presence of aortocoronary bypass graft: Secondary | ICD-10-CM | POA: Diagnosis not present

## 2017-01-24 DIAGNOSIS — E785 Hyperlipidemia, unspecified: Secondary | ICD-10-CM | POA: Insufficient documentation

## 2017-01-24 DIAGNOSIS — Z955 Presence of coronary angioplasty implant and graft: Secondary | ICD-10-CM | POA: Insufficient documentation

## 2017-01-24 DIAGNOSIS — K648 Other hemorrhoids: Secondary | ICD-10-CM | POA: Diagnosis not present

## 2017-01-24 DIAGNOSIS — E119 Type 2 diabetes mellitus without complications: Secondary | ICD-10-CM | POA: Diagnosis not present

## 2017-01-24 DIAGNOSIS — I2510 Atherosclerotic heart disease of native coronary artery without angina pectoris: Secondary | ICD-10-CM | POA: Insufficient documentation

## 2017-01-24 DIAGNOSIS — K640 First degree hemorrhoids: Secondary | ICD-10-CM | POA: Diagnosis not present

## 2017-01-24 DIAGNOSIS — I252 Old myocardial infarction: Secondary | ICD-10-CM | POA: Diagnosis not present

## 2017-01-24 HISTORY — DX: Sleep disorder, unspecified: G47.9

## 2017-01-24 HISTORY — PX: COLONOSCOPY WITH PROPOFOL: SHX5780

## 2017-01-24 HISTORY — DX: Achilles tendinitis, unspecified leg: M76.60

## 2017-01-24 HISTORY — DX: Calcific tendinitis, other site: M65.28

## 2017-01-24 LAB — GLUCOSE, CAPILLARY: GLUCOSE-CAPILLARY: 137 mg/dL — AB (ref 65–99)

## 2017-01-24 LAB — HM COLONOSCOPY

## 2017-01-24 SURGERY — COLONOSCOPY WITH PROPOFOL
Anesthesia: General

## 2017-01-24 MED ORDER — PROPOFOL 10 MG/ML IV BOLUS
INTRAVENOUS | Status: AC
  Filled 2017-01-24: qty 20

## 2017-01-24 MED ORDER — PROPOFOL 500 MG/50ML IV EMUL
INTRAVENOUS | Status: DC | PRN
  Administered 2017-01-24: 120 ug/kg/min via INTRAVENOUS

## 2017-01-24 MED ORDER — SODIUM CHLORIDE 0.9 % IV SOLN: INTRAVENOUS | Status: DC

## 2017-01-24 MED ORDER — SODIUM CHLORIDE 0.9 % IV SOLN
INTRAVENOUS | Status: DC
  Administered 2017-01-24: 10:00:00 via INTRAVENOUS

## 2017-01-24 MED ORDER — PROPOFOL 10 MG/ML IV BOLUS
INTRAVENOUS | Status: DC | PRN
  Administered 2017-01-24: 90 mg via INTRAVENOUS

## 2017-01-24 NOTE — H&P (Signed)
Primary Care Physician:  Crecencio Mc, MD Primary Gastroenterologist:  Dr. Vira Agar  Pre-Procedure History & Physical: HPI:  Brett [name] is a 76 y.o. male is here for an colonoscopy.   Past Medical History:  Diagnosis Date  . Allergy    resolved after CABG  . Calcific Achilles tendonitis   . Calculus of ureter   . Coronary artery disease   . Diabetes mellitus   . H/O renal calculi 2012  . Hematuria, unspecified   . Hyperlipidemia   . Hypertension   . Impotence of organic origin   . Myocardial infarction ([place])   . Peyronie's disease   . Pulmonary nodule seen on imaging study 2012  . Sleep difficulties   . Swelling, mass, or lump in chest   . Unspecified hypothyroidism     Past Surgical History:  Procedure Laterality Date  . CARDIAC CATHETERIZATION  2001   [facility] x3 stent  . COLONOSCOPY    . CORONARY ARTERY BYPASS GRAFT  2006   4 vessel, after 3 or 4 stents   . CORONARY ARTERY BYPASS GRAFT    . OSTECTOMY CALCANEUS    . PENILE PROSTHESIS IMPLANT      Prior to Admission medications   Medication Sig Start Date End Date Taking? Authorizing Provider  aspirin 81 MG tablet Take 1 tablet (81 mg total) by mouth daily. 05/08/12  Yes Crecencio Mc, MD  atorvastatin (LIPITOR) 80 MG tablet Take 1 tablet (80 mg total) by mouth daily. 10/24/14  Yes Nahser, Wonda Cheng, MD  levothyroxine (SYNTHROID, LEVOTHROID) 112 MCG tablet TAKE 1 TABLET BY MOUTH EVERY DAY 12/27/16  Yes Crecencio Mc, MD  losartan (COZAAR) 25 MG tablet TAKE 1 TABLET (25 MG TOTAL) BY MOUTH DAILY. 04/05/16  Yes Crecencio Mc, MD  metoprolol tartrate (LOPRESSOR) 25 MG tablet TAKE 1 TABLET (25 MG TOTAL) BY MOUTH 2 (TWO) TIMES DAILY. 12/24/16  Yes Crecencio Mc, MD  tamsulosin (FLOMAX) 0.4 MG CAPS capsule TAKE 1 CAPSULE EVERY DAY 10/22/16  Yes Crecencio Mc, MD  venlafaxine (EFFEXOR) 75 MG tablet Take 75 mg by mouth daily. 01/04/12  Yes Crecencio Mc, MD  FREESTYLE LITE test strip CHECK BLOOD SUGAR  DAILY AS DIRECTED 09/01/15   Crecencio Mc, MD  glipiZIDE (GLUCOTROL) 5 MG tablet TAKE 1 TABLET (5 MG TOTAL) BY MOUTH DAILY BEFORE BREAKFAST. 12/17/16   Crecencio Mc, MD  glucose monitoring kit (FREESTYLE) monitoring kit 1 each by Does not apply route as needed for other. For use daily to monitor diabetes.  Please include lancets .  Test once daily   E11.9 07/18/15   Crecencio Mc, MD  Lancets (FREESTYLE) lancets Use as instructed 08/04/15   Crecencio Mc, MD  promethazine (PHENERGAN) 12.5 MG tablet Take 1 tablet (12.5 mg total) by mouth every 8 (eight) hours as needed for nausea or vomiting. 11/03/16   Crecencio Mc, MD    Allergies as of 11/11/2016  . (Not on File)    Family History  Problem Relation Age of Onset  . Hyperlipidemia Mother   . AAA (abdominal aortic aneurysm) Mother   . Heart disease Father 69    Social History   Social History  . Marital status: Married    Spouse name: Audelia Acton now deceased  . Number of children: N/A  . Years of education: N/A   Occupational History  .  Retired   Social History Main Topics  . Smoking status: Former Smoker  Types: Cigars    Quit date: 10/04/1981  . Smokeless tobacco: Never Used  . Alcohol use 0.6 oz/week    1 Cans of beer per week     Comment: everyday  . Drug use: No  . Sexual activity: Not Currently   Other Topics Concern  . Not on file   Social History Narrative  . No narrative on file    Review of Systems: See HPI, otherwise negative ROS  Physical Exam: BP 123/64   Pulse (!) 56   Temp 97.9 F (36.6 C) (Tympanic)   Resp 16   Ht 5' 11"  (1.803 m)   Wt 83.5 kg (184 lb)   SpO2 98%   BMI 25.66 kg/m  General:   Alert,  pleasant and cooperative in NAD Head:  Normocephalic and atraumatic. Neck:  Supple; no masses or thyromegaly. Lungs:  Clear throughout to auscultation.    Heart:  Regular rate and rhythm. Abdomen:  Soft, nontender and nondistended. Normal bowel sounds, without guarding, and without  rebound.   Neurologic:  Alert and  oriented x4;  grossly normal neurologically.  Impression/Plan: Brett [name] is here for an colonoscopy to be performed for [facility] colon polyps.  Risks, benefits, limitations, and alternatives regarding  colonoscopy have been reviewed with the patient.  Questions have been answered.  All parties agreeable.   Gaylyn Cheers, MD  01/24/2017, 10:10 AM

## 2017-01-24 NOTE — Anesthesia Preprocedure Evaluation (Signed)
Anesthesia Evaluation  Patient identified by MRN, date of birth, ID band Patient awake    Reviewed: Allergy & Precautions, H&P , NPO status , Patient's Chart, lab work & pertinent test results, reviewed documented beta blocker date and time   History of Anesthesia Complications Negative for: history of anesthetic complications  Airway Mallampati: I  TM Distance: >3 FB Neck ROM: full    Dental  (+) Caps, Missing   Pulmonary neg shortness of breath, neg sleep apnea, neg COPD, Recent URI , Residual Cough, former smoker,           Cardiovascular Exercise Tolerance: Good hypertension, (-) angina+ CAD, + Past MI, + Cardiac Stents and + CABG  (-) dysrhythmias (-) Valvular Problems/Murmurs     Neuro/Psych PSYCHIATRIC DISORDERS (Depression) negative neurological ROS     GI/Hepatic negative GI ROS, Neg liver ROS,   Endo/Other  diabetes, Oral Hypoglycemic AgentsHypothyroidism   Renal/GU negative Renal ROS  negative genitourinary   Musculoskeletal   Abdominal   Peds  Hematology negative hematology ROS (+)   Anesthesia Other Findings Past Medical History: No date: Allergy     Comment:  resolved after CABG No date: Calcific Achilles tendonitis No date: Calculus of ureter No date: Coronary artery disease No date: Diabetes mellitus 2012: H/O renal calculi No date: Hematuria, unspecified No date: Hyperlipidemia No date: Hypertension 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 No date: Myocardial infarction ([place]) No date: Peyronie's disease 2012: Pulmonary nodule seen on imaging study No date: Sleep difficulties No date: Swelling, mass, or lump in chest No date: Unspecified hypothyroidism   Reproductive/Obstetrics negative OB ROS                             Anesthesia Physical Anesthesia Plan  ASA: III  Anesthesia Plan: General   Post-op Pain Management:    Induction: Intravenous  PONV Risk  Score and Plan: 2 and Propofol infusion  Airway Management Planned: Natural Airway and Nasal Cannula  Additional Equipment:   Intra-op Plan:   Post-operative Plan:   Informed Consent: I have reviewed the patients History and Physical, chart, labs and discussed the procedure including the risks, benefits and alternatives for the proposed anesthesia with the patient or authorized representative who has indicated his/her understanding and acceptance.   Dental Advisory Given  Plan Discussed with: Anesthesiologist, CRNA and Surgeon  Anesthesia Plan Comments:         Anesthesia Quick Evaluation

## 2017-01-24 NOTE — Op Note (Signed)
[facility] Gastroenterology Patient Name: Brett [name] Procedure Date: 01/24/2017 10:12 AM MRN: [id number] Account #: [id number] Date of Birth: [birth year] Admit Type: Outpatient Age: 76 Room: [facility] ENDO ROOM 3 Gender: Male Note Status: Finalized Procedure:            Colonoscopy Indications:          High risk colon cancer surveillance: Personal history                        of colonic polyps Providers:            Manya Silvas, MD Referring MD:         Deborra Medina, MD (Referring MD) Medicines:            Propofol per Anesthesia Complications:        No immediate complications. Procedure:            Pre-Anesthesia Assessment:                       - After reviewing the risks and benefits, the patient                        was deemed in satisfactory condition to undergo the                        procedure.                       After obtaining informed consent, the colonoscope was                        passed under direct vision. Throughout the procedure,                        the patient's blood pressure, pulse, and oxygen                        saturations were monitored continuously. The                        Colonoscope was introduced through the anus and                        advanced to the the cecum, identified by the                        appendiceal orifice, ileocecal valve and palpation. The                        colonoscopy was somewhat difficult due to significant                        looping. Successful completion of the procedure was                        aided by applying abdominal pressure. The patient                        tolerated the procedure well. The quality of the bowel  preparation was good. Findings:      Internal hemorrhoids were found during endoscopy. The hemorrhoids were       small and Grade I (internal hemorrhoids that do not prolapse).      The exam was otherwise without  abnormality. Impression:           - Internal hemorrhoids.                       - The examination was otherwise normal.                       - No specimens collected. Recommendation:       - Repeat colonoscopy in 5 years for surveillance. Manya Silvas, MD 01/24/2017 10:46:29 AM This report has been signed electronically. Number of Addenda: 0 Note Initiated On: 01/24/2017 10:12 AM Scope Withdrawal Time: 0 hours 10 minutes 14 seconds  Total Procedure Duration: 0 hours 27 minutes 3 seconds       [facility]

## 2017-01-24 NOTE — Transfer of Care (Signed)
Immediate Anesthesia Transfer of Care Note  Patient: Brett [name]  Procedure(s) Performed: Procedure(s): COLONOSCOPY WITH PROPOFOL (N/A)  Patient Location: PACU and Endoscopy Unit  Anesthesia Type:General  Level of Consciousness: drowsy and patient cooperative  Airway & Oxygen Therapy: Patient Spontanous Breathing and Patient connected to nasal cannula oxygen  Post-op Assessment: Report given to RN and Post -op Vital signs reviewed and stable  Post vital signs: Reviewed and stable  Last Vitals:  Vitals:   01/24/17 0935 01/24/17 1048  BP: 123/64 110/62  Pulse: (!) 56 (!) 54  Resp: 16 12  Temp: 36.6 C (!) 36.1 C  SpO2: 98% 97%    Last Pain:  Vitals:   01/24/17 1048  TempSrc: Tympanic         Complications: No apparent anesthesia complications

## 2017-01-24 NOTE — Anesthesia Post-op Follow-up Note (Signed)
Anesthesia QCDR form completed.        

## 2017-01-24 NOTE — Anesthesia Postprocedure Evaluation (Signed)
Anesthesia Post Note  Patient: Marquan Blea  Procedure(s) Performed: Procedure(s) (LRB): COLONOSCOPY WITH PROPOFOL (N/A)  Patient location during evaluation: Endoscopy Anesthesia Type: General Level of consciousness: awake and alert Pain management: pain level controlled Vital Signs Assessment: post-procedure vital signs reviewed and stable Respiratory status: spontaneous breathing, nonlabored ventilation, respiratory function stable and patient connected to nasal cannula oxygen Cardiovascular status: blood pressure returned to baseline and stable Postop Assessment: no signs of nausea or vomiting Anesthetic complications: no     Last Vitals:  Vitals:   01/24/17 1108 01/24/17 1118  BP: 125/69 123/63  Pulse: (!) 51 (!) 50  Resp: 11 15  Temp:    SpO2: 100% 100%    Last Pain:  Vitals:   01/24/17 1048  TempSrc: Tympanic                 Martha Clan

## 2017-01-25 ENCOUNTER — Encounter: Payer: Self-pay | Admitting: Unknown Physician Specialty

## 2017-01-25 ENCOUNTER — Other Ambulatory Visit: Payer: Self-pay | Admitting: Internal Medicine

## 2017-01-25 DIAGNOSIS — E034 Atrophy of thyroid (acquired): Secondary | ICD-10-CM

## 2017-02-04 ENCOUNTER — Other Ambulatory Visit: Payer: Self-pay

## 2017-03-02 NOTE — Telephone Encounter (Signed)
Error

## 2017-03-05 ENCOUNTER — Other Ambulatory Visit: Payer: Self-pay | Admitting: Internal Medicine

## 2017-03-10 ENCOUNTER — Ambulatory Visit: Payer: PPO | Admitting: Family

## 2017-04-20 ENCOUNTER — Other Ambulatory Visit: Payer: Self-pay

## 2017-04-20 MED ORDER — TAMSULOSIN HCL 0.4 MG PO CAPS: 0.4000 mg | ORAL_CAPSULE | Freq: Every day | ORAL | 0 refills | Status: DC

## 2017-05-11 ENCOUNTER — Ambulatory Visit (INDEPENDENT_AMBULATORY_CARE_PROVIDER_SITE_OTHER): Payer: PPO

## 2017-05-11 VITALS — BP 122/80 | HR 52 | Temp 98.3°F | Resp 14 | Ht 70.5 in | Wt 189.0 lb

## 2017-05-11 DIAGNOSIS — Z0000 Encounter for general adult medical examination without abnormal findings: Secondary | ICD-10-CM

## 2017-05-11 NOTE — Progress Notes (Signed)
  I have reviewed the above information and agree with above.   Keniesha Adderly, MD 

## 2017-05-11 NOTE — Patient Instructions (Addendum)
  Brett [name] , Thank you for taking time to come for your Medicare Wellness Visit. I appreciate your ongoing commitment to your health goals. Please review the following plan we discussed and let me know if I can assist you in the future.   Follow up with Dr. Derrel Nip as needed.    Bring a copy of your [facility] and/or Living Will to be scanned into chart.  Have a great day and Merry Christmas!  These are the goals we discussed: Goals    . Healthy Lifestyle     Stay hydrated and drink plenty of fluids. Low carb foods.  Vegetables, lean meats.    . Increase physical activity     Walk for exercise       This is a list of the screening recommended for you and due dates:  Health Maintenance  Topic Date Due  . Complete foot exam   01/22/2017  . Hemoglobin A1C  07/06/2017  . Eye exam for diabetics  02/14/2018  . Colon Cancer Screening  01/24/2022  . Tetanus Vaccine  03/17/2023  . Flu Shot  Completed  . Pneumonia vaccines  Completed

## 2017-05-11 NOTE — Progress Notes (Signed)
Subjective:   Brett [name] is a 76 y.o. male who presents for Medicare Annual/Subsequent preventive examination.  Review of Systems:  No ROS.  Medicare Wellness Visit. Additional risk factors are reflected in the social history.  Cardiac Risk Factors include: advanced age (>71mn, >>40women);male gender;hypertension;diabetes mellitus     Objective:    Vitals: BP 122/80 (BP Location: Right Arm, Patient Position: Sitting, Cuff Size: Normal)   Pulse (!) 52   Temp 98.3 F (36.8 C) (Oral)   Resp 14   Ht 5' 10.5" (1.791 m)   Wt 189 lb (85.7 kg)   SpO2 95%   BMI 26.74 kg/m   Body mass index is 26.74 kg/m.  Advanced Directives 05/11/2017 01/24/2017 05/11/2016 05/07/2015  Does Patient Have a Medical Advance Directive? Yes No Yes Yes  Type of [place]aramed[place]of [place] will - [place] will [place] will  Does patient want to make changes to medical advance directive? No - Patient declined - - -  Copy of [place] Chart? No - copy requested - No - copy requested No - copy requested    Tobacco Social History   Tobacco Use  Smoking Status Former Smoker  . Types: Cigars  . Last attempt to quit: 10/04/1981  . Years since quitting: 35.6  Smokeless Tobacco Never Used     Counseling given: Not Answered   Clinical Intake:  Pre-visit preparation completed: Yes  Pain : No/denies pain     Nutritional Status: BMI 25 -29 Overweight Diabetes: Yes(Followed by PCP)  Activities of Daily Living: Independent Ambulation: Independent Medication Administration: Independent Home Management: Independent  Barriers to Care Management & Learning: None  Do you feel unsafe in your current relationship?: No Do you feel physically threatened by others?: No Anyone hurting you at home, work, or school?: No Unable to ask?: No Information provided on Community resources: No  How often do you  need to have someone help you when you read instructions, pamphlets, or other written materials from your doctor or pharmacy?: 1 - Never  Interpreter Needed?: No     Past Medical History:  Diagnosis Date  . Allergy    resolved after CABG  . Calcific Achilles tendonitis   . Calculus of ureter   . Coronary artery disease   . Diabetes mellitus   . H/O renal calculi 2012  . Hematuria, unspecified   . Hyperlipidemia   . Hypertension   . Impotence of organic origin   . Myocardial infarction ([place]   . Peyronie's disease   . Pulmonary nodule seen on imaging study 2012  . Sleep difficulties   . Swelling, mass, or lump in chest   . Unspecified hypothyroidism    Past Surgical History:  Procedure Laterality Date  . CARDIAC CATHETERIZATION  2001   [facility] x3 stent  . COLONOSCOPY    . COLONOSCOPY WITH PROPOFOL N/A 01/24/2017   Procedure: COLONOSCOPY WITH PROPOFOL;  Surgeon: EManya Silvas MD;  [facility];  Service: Endoscopy;  Laterality: N/A;  . CORONARY ARTERY BYPASS GRAFT  2006   4 vessel, after 3 or 4 stents   . CORONARY ARTERY BYPASS GRAFT    . OSTECTOMY CALCANEUS    . PENILE PROSTHESIS IMPLANT     Family History  Problem Relation Age of Onset  . Hyperlipidemia Mother   . AAA (abdominal aortic aneurysm) Mother   . Heart disease Father 769  Social History  Socioeconomic History  . Marital status: Married    Spouse name: Audelia Acton now deceased  . Number of children: None  . Years of education: None  . Highest education level: None  Social Needs  . Financial resource strain: None  . Food insecurity - worry: None  . Food insecurity - inability: None  . Transportation needs - medical: None  . Transportation needs - non-medical: None  Occupational History    Employer: retired  Tobacco Use  . Smoking status: Former Smoker    Types: Cigars    Last attempt to quit: 10/04/1981    Years since quitting: 35.6  . Smokeless tobacco: Never Used    Substance and Sexual Activity  . Alcohol use: Yes    Alcohol/week: 0.6 oz    Types: 1 Cans of beer per week    Comment: everyday  . Drug use: No  . Sexual activity: Not Currently  Other Topics Concern  . None  Social History Narrative  . None    Outpatient Encounter Medications as of 05/11/2017  Medication Sig  . aspirin 81 MG tablet Take 1 tablet (81 mg total) by mouth daily.  Marland Kitchen FREESTYLE LITE test strip CHECK BLOOD SUGAR DAILY AS DIRECTED  . glipiZIDE (GLUCOTROL) 5 MG tablet TAKE 1 TABLET (5 MG TOTAL) BY MOUTH DAILY BEFORE BREAKFAST.  Marland Kitchen glucose monitoring kit (FREESTYLE) monitoring kit 1 each by Does not apply route as needed for other. For use daily to monitor diabetes.  Please include lancets .  Test once daily   E11.9  . Lancets (FREESTYLE) lancets Use as instructed  . levothyroxine (SYNTHROID, LEVOTHROID) 112 MCG tablet TAKE 1 TABLET BY MOUTH EVERY DAY  . losartan (COZAAR) 25 MG tablet TAKE 1 TABLET (25 MG TOTAL) BY MOUTH DAILY.  . metoprolol tartrate (LOPRESSOR) 25 MG tablet TAKE 1 TABLET (25 MG TOTAL) BY MOUTH 2 (TWO) TIMES DAILY.  . tamsulosin (FLOMAX) 0.4 MG CAPS capsule Take 1 capsule (0.4 mg total) daily by mouth.  . venlafaxine (EFFEXOR) 75 MG tablet Take 75 mg by mouth 2 (two) times daily with a meal.   . [DISCONTINUED] atorvastatin (LIPITOR) 80 MG tablet Take 1 tablet (80 mg total) by mouth daily.  . [DISCONTINUED] promethazine (PHENERGAN) 12.5 MG tablet Take 1 tablet (12.5 mg total) by mouth every 8 (eight) hours as needed for nausea or vomiting.   No facility-administered encounter medications on file as of 05/11/2017.     Activities of Daily Living In your present state of health, do you have any difficulty performing the following activities: 05/11/2017 05/11/2016  Hearing? N N  Vision? N N  Difficulty concentrating or making decisions? N N  Walking or climbing stairs? N N  Dressing or bathing? N N  Doing errands, shopping? N N  Preparing Food and eating ? N N   Using the Toilet? N N  In the past six months, have you accidently leaked urine? N N  Do you have problems with loss of bowel control? N N  Managing your Medications? N N  Managing your Finances? N N  Housekeeping or managing your Housekeeping? N N  Some recent data might be hidden    Patient Care Team: Crecencio Mc, MD as PCP - General (Internal Medicine) Minna Merritts, MD as Consulting Physician (Cardiology)   Assessment:    This is a routine wellness examination for Atley. The goal of the wellness visit is to assist the patient how to close the gaps in care and create  a preventative care plan for the patient.   The roster of all physicians providing medical care to patient is listed in the Snapshot section of the chart.  Osteoporosis risk reviewed.    Safety issues reviewed; Smoke and carbon monoxide detectors in the home. No firearms in the home.  Wears seatbelts when driving or riding with others. Patient does wear sunscreen or protective clothing when in direct sunlight. No violence in the home.  Patient is alert, normal appearance, oriented to person/place/and time. Correctly identified the president of the [place], recall of 3/3 words, and performing simple calculations. Displays appropriate judgement and can read correct time from watch face.   No new identified risk were noted.  No failures at ADL's or IADL's.  BMI- discussed the importance of a healthy diet, water intake and the benefits of aerobic exercise. He has not participated in an exercise regimen for 6 months.  Educational material provided.  I encouraged him to walk 150 minutes per week for exercise.    24 hour diet recall: Breakfast: oatmeal, blueberries Lunch: deli sandwich Dinner: lean meat, vegetable x2  Daily fluid intake: 1 cups of caffeine, 5-6 cups of water  Dental- every 6 months.  [place]- Visual acuity not assessed per patient preference since they have regular follow up with  the ophthalmologist.  Wears corrective lenses when reading. Followed by [facility]  He remarks of watering/tearful eyes; more noticeable in the mornings.  Suggested OTC eye drops for dry eye with follow up with PCP or [facility] if no improvement or worsening symptoms.  Sleep patterns- Sleeps 5 hrs at night; accustomed to sleeping 8. Recent difficulty staying asleep and wakes feeling fatigued.  Doses during the day.    Medication- Atorvastatin 75m; states he discontinued after reading side effects of long-term use with potential link to Dementia.  Venlafaxine 743m he increased dose to 15080mwo months ago due to little interest or pleasure in doing things he generally enjoys.  He notices little to no change with the increase.  Scheduled for follow up with PCP before leaving.   Patient Concerns:Medication management, depression. See note above. Follow up scheduled.   Exercise Activities and Dietary recommendations Current Exercise Habits: The patient does not participate in regular exercise at present  Goals    . Healthy Lifestyle     Stay hydrated and drink plenty of fluids. Low carb foods.  Vegetables, lean meats.    . Increase physical activity     Walk for exercise      Fall Risk Fall Risk  05/11/2017 05/06/2016 05/07/2015 05/30/2014 05/08/2012  Falls in the past year? No No No No No   Depression Screen PHQ 2/9 Scores 05/11/2017 05/06/2016 05/07/2015 05/30/2014  PHQ - 2 Score 3 0 2 0  PHQ- 9 Score 5 - 3 -  He verbalizes little pleasure in doing things, feeling tired/having little energy and difficulty staying asleep.  No thoughts of feeling bad about self, self harm or harm to others.  No change in appetite or difficulty concentrating.  Deferred to PCP for follow up.    Cognitive Function MMSE - Mini Mental State Exam 05/11/2017 05/07/2015  Orientation to time 5 5  Orientation to Place 5 5  Registration 3 3  Attention/ Calculation 5 5  Recall 3 3    Language- name 2 objects 2 2  Language- repeat 1 1  Language- follow 3 step command 3 3  Language- read & follow  direction 1 1  Write a sentence 1 1  Copy design 1 1  Total score 30 30     6CIT Screen 05/11/2016  What Year? 0 points  What month? 0 points  What time? 0 points  Count back from 20 0 points  Months in reverse 0 points  Repeat phrase 0 points  Total Score 0    Immunization History  Administered Date(s) Administered  . Influenza Split 03/16/2013, 02/11/2015  . Influenza Whole 02/14/2012  . Influenza-Unspecified 02/05/2013, 02/11/2015, 02/27/2016  . Pneumococcal Conjugate-13 03/16/2013  . Pneumococcal Polysaccharide-23 07/08/2009, 06/25/2015  . Tdap 03/16/2013   Screening Tests Health Maintenance  Topic Date Due  . FOOT EXAM  01/22/2017  . HEMOGLOBIN A1C  07/06/2017  . OPHTHALMOLOGY EXAM  02/14/2018  . COLONOSCOPY  01/24/2022  . TETANUS/TDAP  03/17/2023  . INFLUENZA VACCINE  Completed  . PNA vac Low Risk Adult  Completed      Plan:    End of life planning; Advance aging; Advanced directives discussed. Copy of current HCPOA/Living Will requested.    I have personally reviewed and noted the following in the patient's chart:   . Medical and social history . Use of alcohol, tobacco or illicit drugs  . Current medications and supplements . Functional ability and status . Nutritional status . Physical activity . Advanced directives . List of other physicians . Hospitalizations, surgeries, and ER visits in previous 12 months . Vitals . Screenings to include cognitive, depression, and falls . Referrals and appointments  In addition, I have reviewed and discussed with patient certain preventive protocols, quality metrics, and best practice recommendations. A written personalized care plan for preventive services as well as general preventive health recommendations were provided to patient.     OBrien-Blaney, Denisa L, LPN  [id number]/11/[id number]   I have  reviewed the above information and agree with above.   Deborra Medina, MD

## 2017-05-18 ENCOUNTER — Ambulatory Visit: Payer: PPO | Admitting: Internal Medicine

## 2017-05-18 ENCOUNTER — Encounter: Payer: Self-pay | Admitting: Internal Medicine

## 2017-05-18 VITALS — BP 120/64 | HR 51 | Temp 97.8°F | Resp 15 | Ht 70.5 in | Wt 186.8 lb

## 2017-05-18 DIAGNOSIS — E034 Atrophy of thyroid (acquired): Secondary | ICD-10-CM | POA: Diagnosis not present

## 2017-05-18 DIAGNOSIS — F418 Other specified anxiety disorders: Secondary | ICD-10-CM | POA: Diagnosis not present

## 2017-05-18 DIAGNOSIS — E785 Hyperlipidemia, unspecified: Secondary | ICD-10-CM

## 2017-05-18 DIAGNOSIS — F5102 Adjustment insomnia: Secondary | ICD-10-CM

## 2017-05-18 DIAGNOSIS — E119 Type 2 diabetes mellitus without complications: Secondary | ICD-10-CM | POA: Diagnosis not present

## 2017-05-18 DIAGNOSIS — I25708 Atherosclerosis of coronary artery bypass graft(s), unspecified, with other forms of angina pectoris: Secondary | ICD-10-CM | POA: Diagnosis not present

## 2017-05-18 LAB — COMPREHENSIVE METABOLIC PANEL
ALBUMIN: 4.2 g/dL (ref 3.5–5.2)
ALT: 18 U/L (ref 0–53)
AST: 22 U/L (ref 0–37)
Alkaline Phosphatase: 61 U/L (ref 39–117)
BILIRUBIN TOTAL: 0.5 mg/dL (ref 0.2–1.2)
BUN: 18 mg/dL (ref 6–23)
CALCIUM: 9.1 mg/dL (ref 8.4–10.5)
CHLORIDE: 103 meq/L (ref 96–112)
CO2: 27 meq/L (ref 19–32)
CREATININE: 1.05 mg/dL (ref 0.40–1.50)
GFR: 73.36 mL/min (ref >60.00)
Glucose, Bld: 68 mg/dL — ABNORMAL LOW (ref 70–99)
Potassium: 4 mEq/L (ref 3.5–5.1)
SODIUM: 138 meq/L (ref 135–145)
Total Protein: 7.3 g/dL (ref 6.0–8.3)

## 2017-05-18 LAB — LDL CHOLESTEROL, DIRECT: Direct LDL: 193 mg/dL

## 2017-05-18 LAB — HEMOGLOBIN A1C: HEMOGLOBIN A1C: 6.8 % — AB (ref 4.6–6.5)

## 2017-05-18 LAB — TSH: TSH: 2.7 u[IU]/mL (ref 0.35–4.50)

## 2017-05-18 MED ORDER — DULOXETINE HCL 20 MG PO CPEP: 20.0000 mg | ORAL_CAPSULE | Freq: Every day | ORAL | 3 refills | Status: DC

## 2017-05-18 NOTE — Patient Instructions (Addendum)
For your depression /anxiety/insomnia  We are adding cymbalta ( duloxetine)  Start at 20 mg daily after dinner  Continue the effexor twice daily at 75 mg for the first two weeks,  Then reduce the effexor to once daily in the morning for one week,  Then stop it completely after one week  During week 4 let me know how you are feeling so I  can either refill same dose or increase the cymbalta to 30 mg before you run out  (call Week 4 )   Please reconsider resuming atorvastatin since you are at increased risk for stroke due to aortic atherosclerosis

## 2017-05-18 NOTE — Assessment & Plan Note (Signed)
No improvement with increased does of venlafaxine prescribed by [place].  Changing to duloxetine  Starting at 20 mg daily with venlafaxine overlap for two weeks, to avoid the venlafaxine withdrawal.  Will wean the venlafaxine starting Week 3,  And increase dose to 30 mg  After a month if tolerated

## 2017-05-18 NOTE — Assessment & Plan Note (Signed)
Currently well-controlled on current medications .  hemoglobin A1c is at goal of less than 7.0 . Patient is reminded to schedule an annual eye exam and foot exam is normal today. Patient has no microalbuminuria. Patient has tolerated statin therapy for CAD risk reduction but has decided to stop taking the medications, and is taking an ACE/ARB for renal protection and hypertension   Lab Results  Component Value Date   HGBA1C 6.8 (H) 05/18/2017   Lab Results  Component Value Date   MICROALBUR 1.1 01/03/2017   Lab Results  Component Value Date   CHOL 150 09/16/2016   HDL 65 09/16/2016   LDLCALC 85 09/16/2016   LDLDIRECT 193.0 05/18/2017   TRIG 168 (A) 09/16/2016   CHOLHDL 3 12/31/2015

## 2017-05-18 NOTE — Assessment & Plan Note (Signed)
S/p 3 vessel CABG in 2006. He is asymptomatic , but has decided to stop his statin therapy due to fear of dementia. Advised to reconsider resuming statin given his history of CAD and recent findings of aortic atherosclerosis  Lab Results  Component Value Date   CHOL 150 09/16/2016   HDL 65 09/16/2016   LDLCALC 85 09/16/2016   LDLDIRECT 193.0 05/18/2017   TRIG 168 (A) 09/16/2016   CHOLHDL 3 12/31/2015

## 2017-05-18 NOTE — Progress Notes (Signed)
Subjective:  Patient ID: Brett [name] Seen, male    DOB: 11-25-1940  Age: 76 y.o. MRN: [id number]  CC: The primary encounter diagnosis was Diabetes mellitus without complication ([place]). Diagnoses of Hyperlipidemia LDL goal <70, Hypothyroidism due to acquired atrophy of thyroid, Coronary artery disease of bypass graft of native heart with stable angina pectoris ([place]), Depression with anxiety, and Adjustment insomnia were also pertinent to this visit.  HPI Ivory Ace presents for 6 month follow up on T2DM, hypertension,  Hyperlipidemia,  And depression.   1)  Has been Treated for depression with effexor since his wife died several years ago. Prescribed by his  [place] who increased his dose several months ago with no appreciable change in mood. He reports increased irritability, Lack of motivation, and anhedonia.  appetite is ok,  Not avoiding social contact. Has been happily married 3 years.  Has insomnia that occurs about 3 times per week .  Lasts about 3 hours.  Marland Kitchen   2) Hyperlipidemia with known CAD:  He states that he has stopped taking  Atorvastatin  as of  3 months ago because he read an article from the [facility] clinic stating that statins can cause dementia. states he would rather die of a heart attack than dementia.  Has not seen cardiologist in over  3 years.   3) type 2 DM:  Fasting cbg s 118 to 135 cbgs. Taking glipizide  5 mg daily before breakfast.    Outpatient Medications Prior to Visit  Medication Sig Dispense Refill  . aspirin 81 MG tablet Take 1 tablet (81 mg total) by mouth daily. 30 tablet 11  . FREESTYLE LITE test strip CHECK BLOOD SUGAR DAILY AS DIRECTED 100 each 11  . glipiZIDE (GLUCOTROL) 5 MG tablet TAKE 1 TABLET (5 MG TOTAL) BY MOUTH DAILY BEFORE BREAKFAST. 90 tablet 1  . glucose monitoring kit (FREESTYLE) monitoring kit 1 each by Does not apply route as needed for other. For use daily to monitor diabetes.  Please include lancets .  Test once daily   E11.9 1 each 3  .  Lancets (FREESTYLE) lancets Use as instructed 100 each 12  . levothyroxine (SYNTHROID, LEVOTHROID) 112 MCG tablet TAKE 1 TABLET BY MOUTH EVERY DAY 30 tablet 5  . losartan (COZAAR) 25 MG tablet TAKE 1 TABLET (25 MG TOTAL) BY MOUTH DAILY. 90 tablet 2  . metoprolol tartrate (LOPRESSOR) 25 MG tablet TAKE 1 TABLET (25 MG TOTAL) BY MOUTH 2 (TWO) TIMES DAILY. 180 tablet 0  . tamsulosin (FLOMAX) 0.4 MG CAPS capsule Take 1 capsule (0.4 mg total) daily by mouth. 90 capsule 0  . venlafaxine (EFFEXOR) 75 MG tablet Take 75 mg by mouth 2 (two) times daily with a meal.      No facility-administered medications prior to visit.     Review of Systems;  Patient denies headache, fevers, malaise, unintentional weight loss, skin rash, eye pain, sinus congestion and sinus pain, sore throat, dysphagia,  hemoptysis , cough, dyspnea, wheezing, chest pain, palpitations, orthopnea, edema, abdominal pain, nausea, melena, diarrhea, constipation, flank pain, dysuria, hematuria, urinary  Frequency, nocturia, numbness, tingling, seizures,  Focal weakness, Loss of consciousness,  Tremor, insomnia, depression, anxiety, and suicidal ideation.      Objective:  BP 120/64 (BP Location: Left Arm, Patient Position: Sitting, Cuff Size: Normal)   Pulse (!) 51   Temp 97.8 F (36.6 C) (Oral)   Resp 15   Ht 5' 10.5" (1.791 m)   Wt 186 lb 12.8 oz (84.7 kg)  SpO2 93%   BMI 26.42 kg/m   BP Readings from Last 3 Encounters:  05/18/17 120/64  05/11/17 122/80  01/24/17 123/63    Wt Readings from Last 3 Encounters:  05/18/17 186 lb 12.8 oz (84.7 kg)  05/11/17 189 lb (85.7 kg)  01/24/17 184 lb (83.5 kg)    General appearance: alert, cooperative and appears stated age Ears: normal TM's and external ear canals both ears Throat: lips, mucosa, and tongue normal; teeth and gums normal Neck: no adenopathy, no carotid bruit, supple, symmetrical, trachea midline and thyroid not enlarged, symmetric, no tenderness/mass/nodules Back:  symmetric, no curvature. ROM normal. No CVA tenderness. Lungs: clear to auscultation bilaterally Heart: regular rate and rhythm, S1, S2 normal, no murmur, click, rub or gallop Abdomen: soft, non-tender; bowel sounds normal; no masses,  no organomegaly Pulses: 2+ and symmetric Skin: Skin color, texture, turgor normal. No rashes or lesions Lymph nodes: Cervical, supraclavicular, and axillary nodes normal.  Lab Results  Component Value Date   HGBA1C 6.8 (H) 05/18/2017   HGBA1C 6.6 (H) 01/03/2017   HGBA1C 6.9 09/16/2016    Lab Results  Component Value Date   CREATININE 1.05 05/18/2017   CREATININE 1.03 01/03/2017   CREATININE 1.1 09/16/2016    Lab Results  Component Value Date   WBC 5.6 09/16/2016   HGB 14.3 09/16/2016   HCT 41.3 11/22/2013   PLT 189 09/16/2016   GLUCOSE 68 (L) 05/18/2017   CHOL 150 09/16/2016   TRIG 168 (A) 09/16/2016   HDL 65 09/16/2016   LDLDIRECT 193.0 05/18/2017   LDLCALC 85 09/16/2016   ALT 18 05/18/2017   AST 22 05/18/2017   NA 138 05/18/2017   K 4.0 05/18/2017   CL 103 05/18/2017   CREATININE 1.05 05/18/2017   BUN 18 05/18/2017   CO2 27 05/18/2017   TSH 2.70 05/18/2017   PSA 0.46 09/13/2016   HGBA1C 6.8 (H) 05/18/2017   MICROALBUR 1.1 01/03/2017    Ct Chest Wo Contrast  Result Date: 11/22/2016 CLINICAL DATA:  Follow-up lung nodule EXAM: CT CHEST WITHOUT CONTRAST TECHNIQUE: Multidetector CT imaging of the chest was performed following the standard protocol without IV contrast. COMPARISON:  07/01/2015, 01/07/2012 FINDINGS: Cardiovascular: Somewhat limited due to the lack of IV contrast. Aortic atherosclerotic disease is noted. Changes of prior coronary bypass grafting are seen. No aneurysmal dilatation is noted. No significant cardiac enlargement is seen. Mediastinum/Nodes: The thoracic inlet is within normal limits. No significant hilar or mediastinal adenopathy is noted. The esophagus as visualized is within normal limits. Lungs/Pleura: The right  lung is well aerated without focal infiltrate or sizable parenchymal nodule. No effusions are seen. There is a stable left lower lobe soft tissue nodule which measures approximately 2.9 by 2.1 cm. Stable pleural thickening is noted adjacent to the lesion. A second subpleural nodule measuring 17 mm is noted and stable from the recent exam. No new focal nodules are seen. Minimal scarring is noted in the left upper lobe also stable from prior studies. Changes of bronchiectasis are noted on the left similar to that seen on prior study. Upper Abdomen: Visualized upper abdomen demonstrates cholelithiasis. No complicating factors are seen. Musculoskeletal: The bony structures are within normal limits. IMPRESSION: Stable left upper and left lower lobe lesions dating back to 2013. This favors a benign process. No new focal abnormality is noted. Aortic Atherosclerosis (ICD10-I70.0). Electronically Signed   By: Inez Catalina M.D.   On: 11/22/2016 12:36    Assessment & Plan:   Problem List Items  Addressed This Visit    CAD (coronary artery disease)    S/p 3 vessel CABG in 2006. He is asymptomatic , but has decided to stop his statin therapy due to fear of dementia. Advised to reconsider resuming statin given his history of CAD and recent findings of aortic atherosclerosis  Lab Results  Component Value Date   CHOL 150 09/16/2016   HDL 65 09/16/2016   LDLCALC 85 09/16/2016   LDLDIRECT 193.0 05/18/2017   TRIG 168 (A) 09/16/2016   CHOLHDL 3 12/31/2015         Depression with anxiety    No improvement with increased does of venlafaxine prescribed by [facility] physician.  Changing to duloxetine  Starting at 20 mg daily with venlafaxine overlap for two weeks, to avoid the venlafaxine withdrawal.  Will wean the venlafaxine starting Week 3,  And increase dose to 30 mg  After a month if tolerated       Relevant Medications   DULoxetine (CYMBALTA) 20 MG capsule   Diabetes mellitus without complication ([place]) - Primary     Currently well-controlled on current medications .  hemoglobin A1c is at goal of less than 7.0 . Patient is reminded to schedule an annual eye exam and foot exam is normal today. Patient has no microalbuminuria. Patient has tolerated statin therapy for CAD risk reduction but has decided to stop taking the medications, and is taking an ACE/ARB for renal protection and hypertension   Lab Results  Component Value Date   HGBA1C 6.8 (H) 05/18/2017   Lab Results  Component Value Date   MICROALBUR 1.1 01/03/2017   Lab Results  Component Value Date   CHOL 150 09/16/2016   HDL 65 09/16/2016   LDLCALC 85 09/16/2016   LDLDIRECT 193.0 05/18/2017   TRIG 168 (A) 09/16/2016   CHOLHDL 3 12/31/2015         Relevant Orders   Comprehensive metabolic panel (Completed)   Hemoglobin A1c (Completed)   Hyperlipidemia LDL goal <70   Relevant Orders   LDL cholesterol, direct (Completed)   Hypothyroidism   Relevant Orders   TSH (Completed)   Insomnia    Not daily. Changing antidepressant to cymbalta.        A total of 25 minutes of face to face time was spent with patient more than half of which was spent in counselling about the above mentioned conditions  and coordination of care   I am having Tavon Brandes start on DULoxetine. I am also having him maintain his aspirin, venlafaxine, glucose monitoring kit, freestyle, FREESTYLE LITE, glipiZIDE, metoprolol tartrate, levothyroxine, losartan, and tamsulosin.  Meds ordered this encounter  Medications  . DULoxetine (CYMBALTA) 20 MG capsule    Sig: Take 1 capsule (20 mg total) by mouth daily.    Dispense:  30 capsule    Refill:  3    There are no discontinued medications.  Follow-up: Return in about 6 months (around 11/16/2017).   Crecencio Mc, MD

## 2017-05-18 NOTE — Assessment & Plan Note (Signed)
Not daily. Changing antidepressant to cymbalta.

## 2017-05-21 ENCOUNTER — Encounter: Payer: Self-pay | Admitting: Internal Medicine

## 2017-05-24 ENCOUNTER — Other Ambulatory Visit: Payer: Self-pay | Admitting: Internal Medicine

## 2017-05-24 DIAGNOSIS — E034 Atrophy of thyroid (acquired): Secondary | ICD-10-CM

## 2017-06-15 ENCOUNTER — Telehealth: Payer: Self-pay | Admitting: Internal Medicine

## 2017-06-15 NOTE — Telephone Encounter (Signed)
Pt stating that dose of Cymbalta should be 40mg  and not 20mg .

## 2017-06-15 NOTE — Telephone Encounter (Signed)
Is it okay to refill the 40mg  cymbalta?

## 2017-06-15 NOTE — Telephone Encounter (Signed)
Copied from [place] [phone]. Topic: Quick Communication - Rx Refill/Question >> Jun 15, 2017  9:59 AM Synthia Innocent wrote: Medication: DULoxetine (CYMBALTA) 20 MG capsule, patient states dose should be 40mg    Has the patient contacted their pharmacy? Yes.     (Agent: If no, request that the patient contact the pharmacy for the refill.)   Preferred Pharmacy (with phone number or street name): [facility]   Agent: Please be advised that RX refills may take up to 3 business days. We ask that you follow-up with your pharmacy.

## 2017-06-15 NOTE — Telephone Encounter (Signed)
Copied from [place] [phone]. Topic: Quick Communication - Rx Refill/Question >> Jun 15, 2017  9:59 AM Synthia Innocent wrote: Medication: DULoxetine (CYMBALTA) 20 MG capsule, patient states dose should be 40mg    Has the patient contacted their pharmacy? Yes.     (Agent: If no, request that the patient contact the pharmacy for the refill.)   Preferred Pharmacy (with phone number or street name): [facility]   Agent: Please be advised that RX refills may take up to 3 business days. We ask that you follow-up with your pharmacy.

## 2017-06-15 NOTE — Telephone Encounter (Signed)
Please advise 

## 2017-06-16 MED ORDER — DULOXETINE HCL 30 MG PO CPEP: 20.0000 mg | ORAL_CAPSULE | Freq: Every day | ORAL | 5 refills | Status: DC

## 2017-06-16 NOTE — Telephone Encounter (Signed)
Notes say to increase to 30 mg after first month,  Will send rx

## 2017-06-22 ENCOUNTER — Other Ambulatory Visit: Payer: Self-pay | Admitting: Internal Medicine

## 2017-06-27 ENCOUNTER — Telehealth: Payer: Self-pay

## 2017-06-27 NOTE — Telephone Encounter (Signed)
Spoke with pt and he stated that he just saw his lab results on his mychart.

## 2017-06-27 NOTE — Telephone Encounter (Signed)
Copied from [place] [phone]. Topic: Inquiry >> Jun 27, 2017 11:44 AM Patrice Paradise wrote: Reason for CRM: Patient calling to get lab results that was done on 05/18/17, he is requesting a call back @ [phone]

## 2017-06-30 ENCOUNTER — Telehealth: Payer: Self-pay | Admitting: Internal Medicine

## 2017-06-30 NOTE — Telephone Encounter (Signed)
Copied from [place]. Topic: General - Other >> Jun 30, 2017  9:46 AM Synthia Innocent wrote: Reason for CRM: [facility] 80mg , is requesting Dr Derrel Nip to prescribe now.  [facility] [address] Please advise

## 2017-06-30 NOTE — Telephone Encounter (Signed)
Please advise 

## 2017-06-30 NOTE — Telephone Encounter (Signed)
Absolutely,  You can refill for 90 days if he needs it now

## 2017-06-30 NOTE — Telephone Encounter (Signed)
Are you ok with prescribing the atorvastatin for pt? Was being prescribed by [facility] dr.

## 2017-06-30 NOTE — Telephone Encounter (Signed)
Copied from [place]. Topic: General - Other >> Jun 30, 2017  9:46 AM Brett [name] wrote: Reason for CRM: [facility] 80mg , is requesting Dr Derrel Nip to prescribe now.  [facility] [address] Please advise

## 2017-07-01 MED ORDER — ATORVASTATIN CALCIUM 80 MG PO TABS: 80.0000 mg | ORAL_TABLET | Freq: Every day | ORAL | 1 refills | Status: DC

## 2017-07-01 NOTE — Telephone Encounter (Signed)
[facility] [address] Per Dr. Lupita Dawn okay.

## 2017-07-19 NOTE — Progress Notes (Signed)
Cardiology Office Note  Date:  07/20/2017   ID:  Brett [name], Brett [name] 09/28/1940, MRN [id number]  PCP:  Crecencio Mc, MD   Chief Complaint  Patient presents with  . other    OD 6 month f/u discuss statin drugs. Meds reviewed verbally with pt.    HPI:  [name] is a very pleasant 77 year old gentleman with history of  HTN,   DM II, HBA1C 6.7,  Hyperlipidemia, total chol 169, LDL 97 CAD,  CABG x4 at [facility]  in 2006,  previous stents Depression/insomnia Who presents for routine follow-up of his coronary artery disease  In follow-up today he continues to exercise 4 days a week walks 45 minutes Denies any chest pain or shortness of breath on exertion Good exercise tolerance  He reports that he stopped the cholesterol medication, read an article stating that statins can cause dementia Repeat lipid panel December 2018 LDL 195, did not realize it was so high Previously was 85 Hemoglobin A1c 6.8  Heart rate low today 45 bpm, feels he is asymptomatic He works as a Mudlogger for the hospital  Reports that he did not tolerate Zetia. This for one week was playing soccer and felt some cramping in his leg and stopped the medication  EKG on today's visit shows sinus bradycardia rate 45 bpm, old inferior MI, T wave ABN precordial leads   PMH:   has a past medical history of Allergy, Calcific Achilles tendonitis, Calculus of ureter, Coronary artery disease, Diabetes mellitus, H/O renal calculi (2012), Hematuria, unspecified, Hyperlipidemia, Hypertension, Impotence of organic origin, Myocardial infarction [facility]), Peyronie's disease, Pulmonary nodule seen on imaging study (2012), Sleep difficulties, Swelling, mass, or lump in chest, and Unspecified hypothyroidism.  PSH:    Past Surgical History:  Procedure Laterality Date  . CARDIAC CATHETERIZATION  2001   [facility] x3 stent  . COLONOSCOPY    . COLONOSCOPY WITH PROPOFOL N/A 01/24/2017   Procedure:  COLONOSCOPY WITH PROPOFOL;  Surgeon: Manya Silvas, MD;  Location: [facility] ENDOSCOPY;  Service: Endoscopy;  Laterality: N/A;  . CORONARY ARTERY BYPASS GRAFT  2006   4 vessel, after 3 or 4 stents   . CORONARY ARTERY BYPASS GRAFT    . OSTECTOMY CALCANEUS    . PENILE PROSTHESIS IMPLANT      Current Outpatient Medications  Medication Sig Dispense Refill  . aspirin 81 MG tablet Take 1 tablet (81 mg total) by mouth daily. 30 tablet 11  . atorvastatin (LIPITOR) 80 MG tablet Take 1 tablet (80 mg total) by mouth daily. 90 tablet 1  . DULoxetine (CYMBALTA) 30 MG capsule Take 30 mg by mouth daily.    Marland Kitchen FREESTYLE LITE test strip CHECK BLOOD SUGAR DAILY AS DIRECTED 100 each 11  . glipiZIDE (GLUCOTROL) 5 MG tablet TAKE 1 TABLET (5 MG TOTAL) BY MOUTH DAILY BEFORE BREAKFAST. 90 tablet 0  . glucose monitoring kit (FREESTYLE) monitoring kit 1 each by Does not apply route as needed for other. For use daily to monitor diabetes.  Please include lancets .  Test once daily   E11.9 1 each 3  . Lancets (FREESTYLE) lancets Use as instructed 100 each 12  . levothyroxine (SYNTHROID, LEVOTHROID) 112 MCG tablet TAKE 1 TABLET BY MOUTH EVERY DAY 30 tablet 5  . losartan (COZAAR) 25 MG tablet TAKE 1 TABLET (25 MG TOTAL) BY MOUTH DAILY. 90 tablet 2  . metoprolol tartrate (LOPRESSOR) 25 MG tablet TAKE 1 TABLET (25 MG TOTAL) BY MOUTH 2 (TWO) TIMES  DAILY. 180 tablet 0  . tamsulosin (FLOMAX) 0.4 MG CAPS capsule Take 1 capsule (0.4 mg total) daily by mouth. 90 capsule 0   No current facility-administered medications for this visit.      Allergies:   Patient has no known allergies.   Social History:  The patient  reports that he quit smoking about 35 years ago. His smoking use included cigars. he has never used smokeless tobacco. He reports that he drinks about 0.6 oz of alcohol per week. He reports that he does not use drugs.   Family History:   family history includes AAA (abdominal aortic aneurysm) in his mother; Heart  disease (age of onset: 40) in his father; Hyperlipidemia in his mother.    Review of Systems: Review of Systems  Constitutional: Negative.   Respiratory: Negative.   Cardiovascular: Negative.   Gastrointestinal: Negative.   Musculoskeletal: Negative.   Neurological: Negative.   Psychiatric/Behavioral: Negative.   All other systems reviewed and are negative.    PHYSICAL EXAM: VS:  BP (!) 110/58 (BP Location: Left Arm, Patient Position: Sitting, Cuff Size: Normal)   Pulse (!) 45   Ht 5' 11"  (1.803 m)   Wt 189 lb 8 oz (86 kg)   BMI 26.43 kg/m  , BMI Body mass index is 26.43 kg/m. Constitutional:  oriented to person, place, and time. No distress.  HENT:  Head: Normocephalic and atraumatic.  Eyes:  no discharge. No scleral icterus.  Neck: Normal range of motion. Neck supple. No JVD present.  Cardiovascular: Normal rate, regular rhythm, normal heart sounds and intact distal pulses. Exam reveals no gallop and no friction rub. No edema No murmur heard. Pulmonary/Chest: Effort normal and breath sounds normal. No stridor. No respiratory distress.  no wheezes.  no rales.  no tenderness.  Abdominal: Soft.  no distension.  no tenderness.  Musculoskeletal: Normal range of motion.  no  tenderness or deformity.  Neurological:  normal muscle tone. Coordination normal. No atrophy Skin: Skin is warm and dry. No rash noted. not diaphoretic.  Psychiatric:  normal mood and affect. behavior is normal. Thought content normal.       Recent Labs: 09/16/2016: Hemoglobin 14.3; Platelets 189 05/18/2017: ALT 18; BUN 18; Creatinine, Ser 1.05; Potassium 4.0; Sodium 138; TSH 2.70    Lipid Panel Lab Results  Component Value Date   CHOL 150 09/16/2016   HDL 65 09/16/2016   LDLCALC 85 09/16/2016   TRIG 168 (A) 09/16/2016      Wt Readings from Last 3 Encounters:  07/20/17 189 lb 8 oz (86 kg)  05/18/17 186 lb 12.8 oz (84.7 kg)  05/11/17 189 lb (85.7 kg)     ASSESSMENT AND PLAN:   Essential  hypertension - Plan: EKG 12-Lead Blood pressure is well controlled on today's visit. No changes made to the medications. Stable  Multiple vessel coronary artery disease Denies any anginal symptoms Long discussion with him, need to stay on his cholesterol medication  Coronary artery disease involving coronary bypass graft of native heart with angina pectoris ([place]) - Plan: EKG 12-Lead Active at baseline with no anginal symptoms Discussed anginal symptoms to watch for Goal LDL less than 70 Previously reported having muscle problems with Zetia Stopped his Lipitor on his own and restarted  Diabetes mellitus without complication (HCC) Hemoglobin A1c 6.8 Recommended he try to watch his diet  Hyperlipidemia LDL goal <70 Swelling recommended he stay on his Lipitor for 100 point found in his numbers without Lipitor  no strong evidence of  Dementia associated with statins   Total encounter time more than 25 minutes  Greater than 50% was spent in counseling and coordination of care with the patient   Disposition:   F/U  12 months   No orders of the defined types were placed in this encounter.    Signed, Esmond Plants, M.D., Ph.D. 07/20/2017  [place], [place]

## 2017-07-20 ENCOUNTER — Encounter: Payer: Self-pay | Admitting: Cardiovascular Disease

## 2017-07-20 ENCOUNTER — Ambulatory Visit: Payer: PPO | Admitting: Cardiovascular Disease

## 2017-07-20 VITALS — BP 110/58 | HR 45 | Ht 71.0 in | Wt 189.5 lb

## 2017-07-20 DIAGNOSIS — I10 Essential (primary) hypertension: Secondary | ICD-10-CM | POA: Diagnosis not present

## 2017-07-20 DIAGNOSIS — E119 Type 2 diabetes mellitus without complications: Secondary | ICD-10-CM | POA: Diagnosis not present

## 2017-07-20 DIAGNOSIS — E785 Hyperlipidemia, unspecified: Secondary | ICD-10-CM | POA: Diagnosis not present

## 2017-07-20 DIAGNOSIS — I25708 Atherosclerosis of coronary artery bypass graft(s), unspecified, with other forms of angina pectoris: Secondary | ICD-10-CM | POA: Diagnosis not present

## 2017-07-20 MED ORDER — METOPROLOL TARTRATE 25 MG PO TABS: 12.5000 mg | ORAL_TABLET | Freq: Two times a day (BID) | ORAL | 3 refills | Status: DC

## 2017-07-20 NOTE — Patient Instructions (Signed)
Medication Instructions:   Please cut the metoprolol in 1/2 twice a day (12.5 mg)  Labwork:  No new labs needed  Testing/Procedures:  No further testing at this time   Follow-Up: It was a pleasure seeing you in the office today. Please call us if you have new issues that need to be addressed before your next appt.  [phone]  Your physician wants you to follow-up in: 12 months.  You will receive a reminder letter in the mail two months in advance. If you don't receive a letter, please call our office to schedule the follow-up appointment.  If you need a refill on your cardiac medications before your next appointment, please call your pharmacy.

## 2017-07-21 NOTE — Addendum Note (Signed)
Addended by: Britt Bottom on: 07/21/2017 02:04 PM   Modules accepted: Orders

## 2017-07-27 ENCOUNTER — Other Ambulatory Visit: Payer: Self-pay | Admitting: Internal Medicine

## 2017-08-10 ENCOUNTER — Other Ambulatory Visit: Payer: Self-pay | Admitting: *Deleted

## 2017-08-10 MED ORDER — DULOXETINE HCL 30 MG PO CPEP: 30.0000 mg | ORAL_CAPSULE | Freq: Every day | ORAL | 1 refills | Status: DC

## 2017-08-10 NOTE — Telephone Encounter (Signed)
Okay to reill? This medication is a historical med that was last filled possibly by Dr. Rockey Situ.  LOV: 05/18/17 NOV: N/A

## 2017-08-25 IMAGING — CT CT CHEST W/O CM
1 series · 15 of 34 positions shown, 19 images · non-contrast
Comparison: 07/01/2015, 01/07/2012

CLINICAL DATA: Follow-up lung nodule

EXAM:
CT CHEST WITHOUT CONTRAST
TECHNIQUE: Multidetector CT imaging of the chest was performed following the
standard protocol without IV contrast.

[Series 2: thorax · axial · 0.81mm/px · z∈[+1069,+1333]mm · 15 of 156 slices shown, 19 images]
[im 12/156  mediastinal]
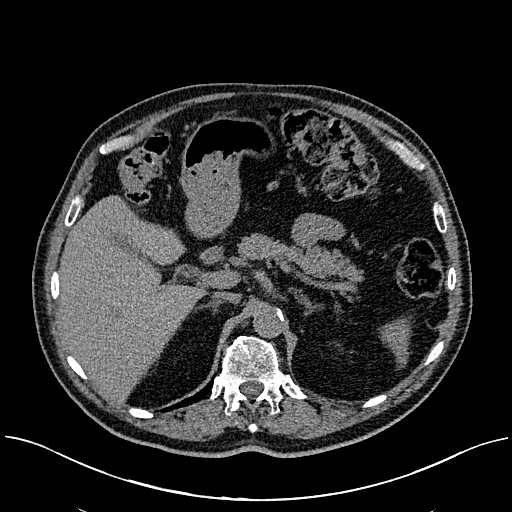
[im 12/156  lung]
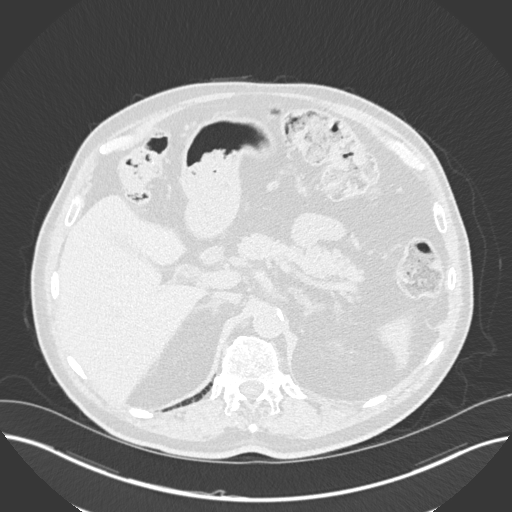
[im 23/156  lung]
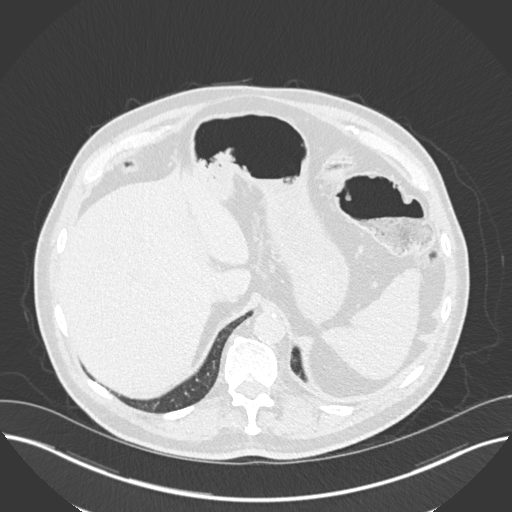
[im 32/156  lung]
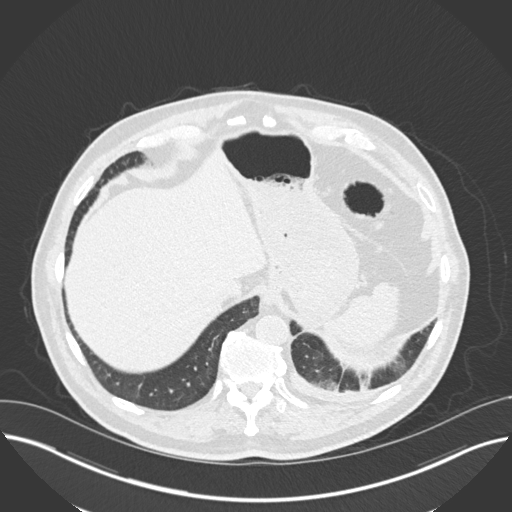
[im 41/156  lung]
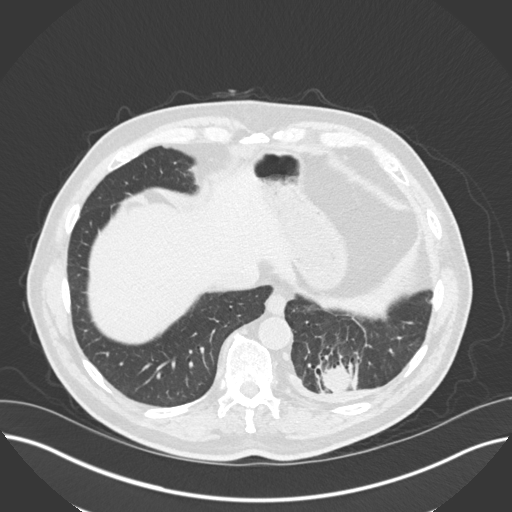
[im 52/156  mediastinal]
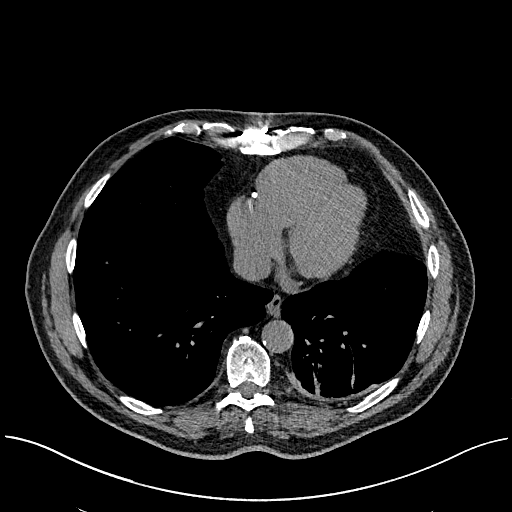
[im 52/156  lung]
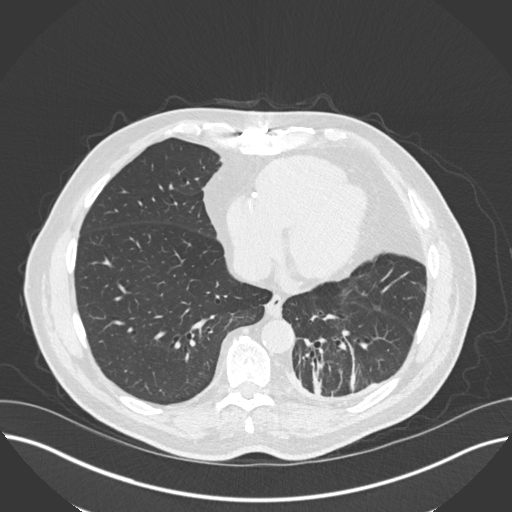
[im 63/156  lung]
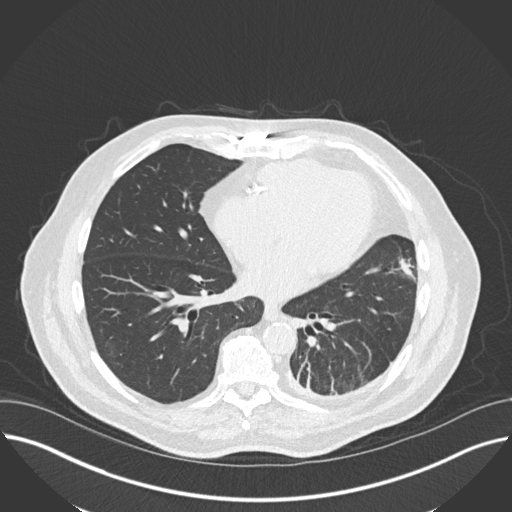
[im 69/156  lung]
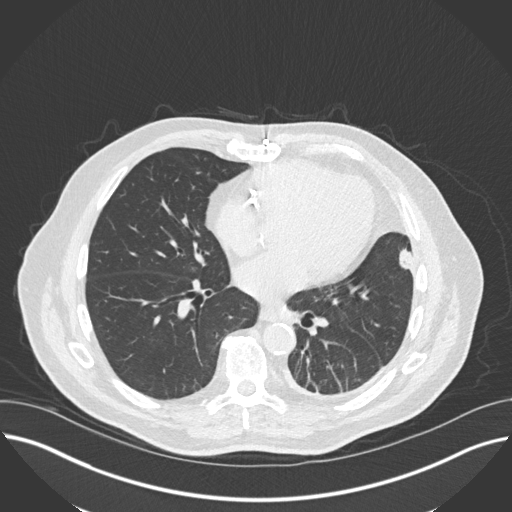
[im 81/156  lung]
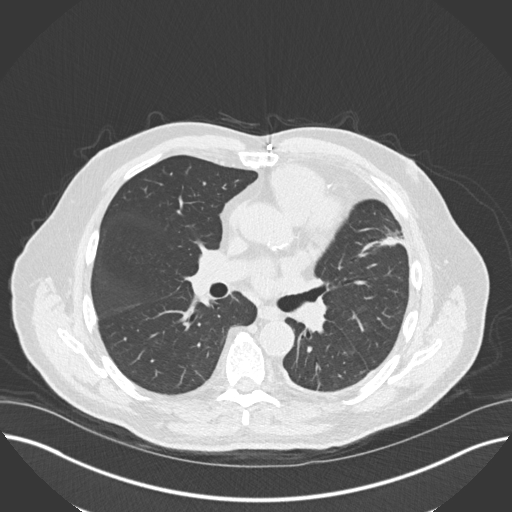
[im 87/156  mediastinal]
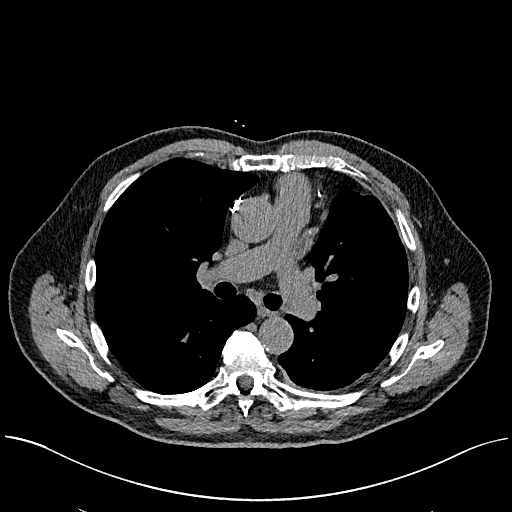
[im 87/156  lung]
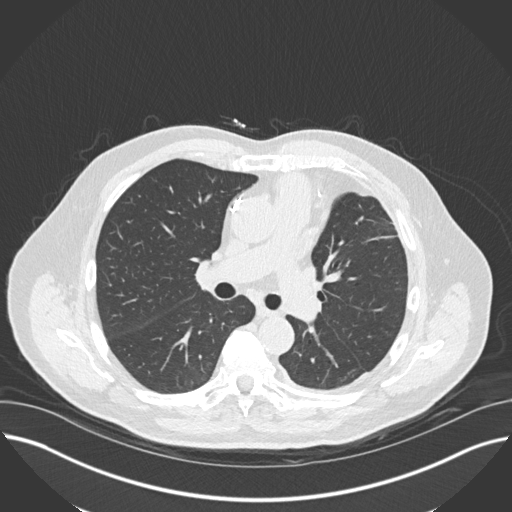
[im 94/156  lung]
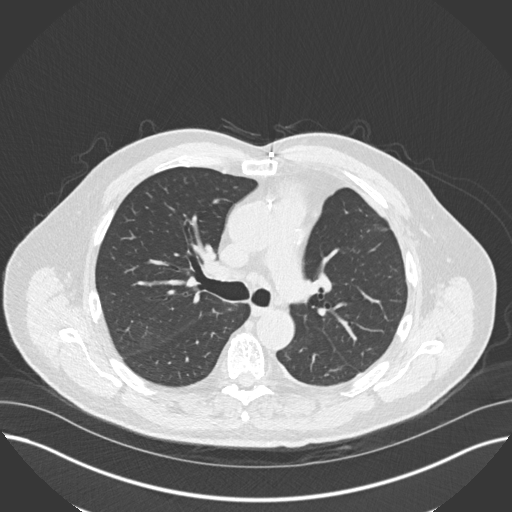
[im 104/156  lung]
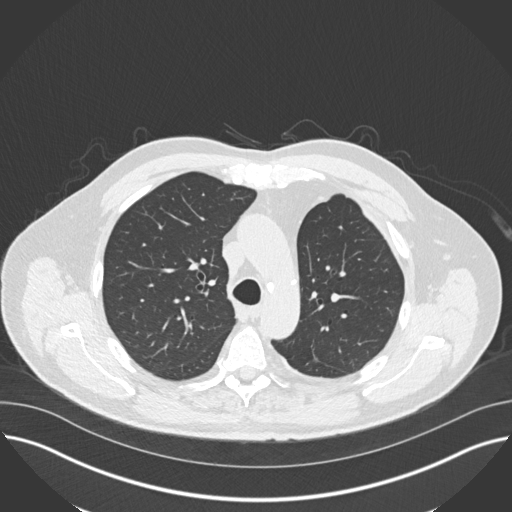
[im 115/156  lung]
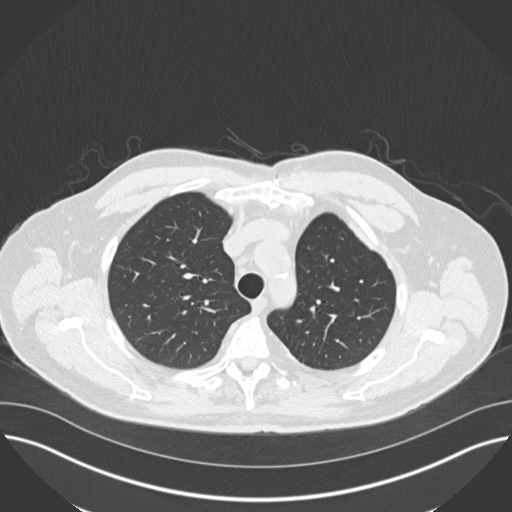
[im 125/156  mediastinal]
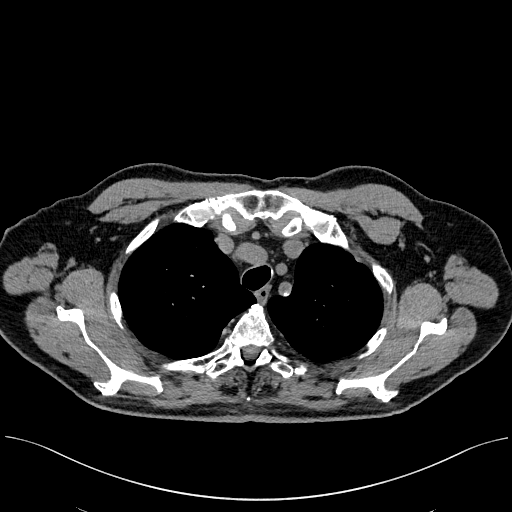
[im 125/156  lung]
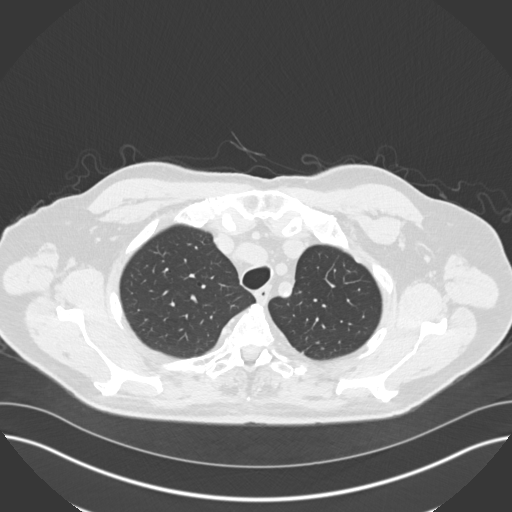
[im 133/156  lung]
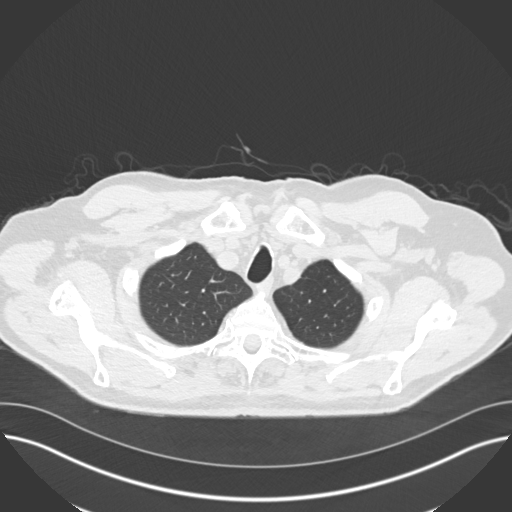
[im 144/156  lung]
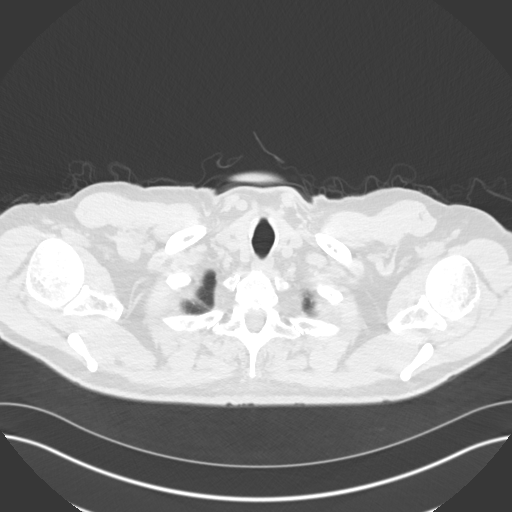

[15 of 34 positions shown; findings below may reference images not displayed]

FINDINGS: Cardiovascular: Somewhat limited due to the lack of IV contrast.
Aortic atherosclerotic disease is noted. Changes of prior coronary
bypass grafting are seen. No aneurysmal dilatation is noted. No
significant cardiac enlargement is seen.

Mediastinum/Nodes: The thoracic inlet is within normal limits. No
significant hilar or mediastinal adenopathy is noted. The esophagus
as visualized is within normal limits.

Lungs/Pleura: The right lung is well aerated without focal
infiltrate or sizable parenchymal nodule. No effusions are seen.
There is a stable left lower lobe soft tissue nodule which measures
approximately 2.9 by 2.1 cm. Stable pleural thickening is noted
adjacent to the lesion. A second subpleural nodule measuring 17 mm
is noted and stable from the recent exam. No new focal nodules are
seen. Minimal scarring is noted in the left upper lobe also stable
from prior studies. Changes of bronchiectasis are noted on the left
similar to that seen on prior study.

Upper Abdomen: Visualized upper abdomen demonstrates cholelithiasis.
No complicating factors are seen.

Musculoskeletal: The bony structures are within normal limits.
IMPRESSION: Stable left upper and left lower lobe lesions dating back to 5162.
This favors a benign process.

No new focal abnormality is noted.

Aortic Atherosclerosis ([id number]-Y71.1).

## 2017-09-17 ENCOUNTER — Other Ambulatory Visit: Payer: Self-pay | Admitting: Internal Medicine

## 2017-10-05 ENCOUNTER — Telehealth: Payer: Self-pay | Admitting: Internal Medicine

## 2017-10-05 NOTE — Telephone Encounter (Signed)
Copied from [place] [phone]. Topic: Quick Communication - Rx Refill/Question >> Oct 05, 2017  5:55 PM Neva Seat wrote: FREESTYLE LITE test strips  Needing refills  [id number] [address], [place] [address] [place] [address] Phone: [phone] Fax: [phone]

## 2017-10-06 MED ORDER — GLUCOSE BLOOD VI STRP: ORAL_STRIP | 5 refills | Status: DC

## 2017-10-06 NOTE — Telephone Encounter (Signed)
Request refill on Freestyle Lite Strips; last refill on 09/01/15; #100; RF x 11 Last office visit ; 05/18/17 Dr. Derrel Nip [facility] [address] [place]   Hgb A1C 6.8 05/18/17; Test strips refilled per protocol

## 2017-10-20 ENCOUNTER — Telehealth: Payer: Self-pay | Admitting: Internal Medicine

## 2017-10-20 MED ORDER — TELMISARTAN 20 MG PO TABS: 20.0000 mg | ORAL_TABLET | Freq: Every day | ORAL | 1 refills | Status: DC

## 2017-10-20 NOTE — Telephone Encounter (Signed)
telmisartan 20 mg sent as alternative to losartan

## 2017-10-20 NOTE — Telephone Encounter (Signed)
Pt stated that he went to the pharmacy and they told him that his losartan (COZAAR) 25 MG tablet rx was one of the ones recalled and needs an alternative rx sent to [place]

## 2017-10-20 NOTE — Telephone Encounter (Signed)
Please advise 

## 2017-10-21 NOTE — Telephone Encounter (Signed)
LMTCB. PEC may speak with pt.  

## 2017-10-26 NOTE — Telephone Encounter (Signed)
Spoke with pt and he stated that he did receive the medication that took place of the losartan that has been recalled.

## 2017-11-23 ENCOUNTER — Other Ambulatory Visit: Payer: Self-pay | Admitting: Internal Medicine

## 2017-11-23 DIAGNOSIS — E034 Atrophy of thyroid (acquired): Secondary | ICD-10-CM

## 2017-12-20 DIAGNOSIS — S50862A Insect bite (nonvenomous) of left forearm, initial encounter: Secondary | ICD-10-CM | POA: Diagnosis not present

## 2017-12-20 DIAGNOSIS — X32XXXA Exposure to sunlight, initial encounter: Secondary | ICD-10-CM | POA: Diagnosis not present

## 2017-12-20 DIAGNOSIS — L821 Other seborrheic keratosis: Secondary | ICD-10-CM | POA: Diagnosis not present

## 2017-12-20 DIAGNOSIS — D2261 Melanocytic nevi of right upper limb, including shoulder: Secondary | ICD-10-CM | POA: Diagnosis not present

## 2017-12-20 DIAGNOSIS — D2262 Melanocytic nevi of left upper limb, including shoulder: Secondary | ICD-10-CM | POA: Diagnosis not present

## 2017-12-20 DIAGNOSIS — L570 Actinic keratosis: Secondary | ICD-10-CM | POA: Diagnosis not present

## 2017-12-20 DIAGNOSIS — D225 Melanocytic nevi of trunk: Secondary | ICD-10-CM | POA: Diagnosis not present

## 2017-12-24 ENCOUNTER — Other Ambulatory Visit: Payer: Self-pay | Admitting: Internal Medicine

## 2018-01-21 ENCOUNTER — Other Ambulatory Visit: Payer: Self-pay | Admitting: Internal Medicine

## 2018-03-09 DIAGNOSIS — H01003 Unspecified blepharitis right eye, unspecified eyelid: Secondary | ICD-10-CM | POA: Diagnosis not present

## 2018-03-09 LAB — HM DIABETES EYE EXAM

## 2018-03-15 ENCOUNTER — Other Ambulatory Visit: Payer: Self-pay | Admitting: Internal Medicine

## 2018-03-28 ENCOUNTER — Other Ambulatory Visit: Payer: Self-pay | Admitting: Internal Medicine

## 2018-03-28 MED ORDER — TELMISARTAN 20 MG PO TABS: 20.0000 mg | ORAL_TABLET | Freq: Every day | ORAL | 0 refills | Status: DC

## 2018-03-28 NOTE — Telephone Encounter (Signed)
Patient called and advised of the last OV note to follow up in June, patient says he wasn't aware and agrees to an appointment on Monday, 04/03/18 at 1330.

## 2018-03-28 NOTE — Telephone Encounter (Signed)
Courtesy refill  

## 2018-03-28 NOTE — Telephone Encounter (Signed)
Copied from [place] [id number]. Topic: Quick Communication - Rx Refill/Question >> Mar 28, 2018  4:52 PM Sheran Luz wrote: Medication: telmisartan (MICARDIS) 20 MG tablet   Pt is requesting refill of this medication   Preferred Pharmacy (with phone number or street name): [id number] [address], [place] [phone] (Phone) [phone] (Fax)    Agent: Please be advised that RX refills may take up to 3 business days. We ask that you follow-up with your pharmacy.

## 2018-03-31 ENCOUNTER — Other Ambulatory Visit: Payer: Self-pay | Admitting: Internal Medicine

## 2018-04-03 ENCOUNTER — Ambulatory Visit (INDEPENDENT_AMBULATORY_CARE_PROVIDER_SITE_OTHER): Payer: PPO | Admitting: Internal Medicine

## 2018-04-03 ENCOUNTER — Encounter: Payer: Self-pay | Admitting: Internal Medicine

## 2018-04-03 VITALS — BP 114/62 | HR 58 | Temp 98.2°F | Resp 15 | Ht 71.0 in | Wt 193.2 lb

## 2018-04-03 DIAGNOSIS — E034 Atrophy of thyroid (acquired): Secondary | ICD-10-CM

## 2018-04-03 DIAGNOSIS — E785 Hyperlipidemia, unspecified: Secondary | ICD-10-CM | POA: Diagnosis not present

## 2018-04-03 DIAGNOSIS — G4710 Hypersomnia, unspecified: Secondary | ICD-10-CM

## 2018-04-03 DIAGNOSIS — Z125 Encounter for screening for malignant neoplasm of prostate: Secondary | ICD-10-CM

## 2018-04-03 DIAGNOSIS — R001 Bradycardia, unspecified: Secondary | ICD-10-CM

## 2018-04-03 DIAGNOSIS — F5102 Adjustment insomnia: Secondary | ICD-10-CM | POA: Diagnosis not present

## 2018-04-03 DIAGNOSIS — E119 Type 2 diabetes mellitus without complications: Secondary | ICD-10-CM | POA: Diagnosis not present

## 2018-04-03 LAB — COMPREHENSIVE METABOLIC PANEL
ALK PHOS: 64 U/L (ref 39–117)
ALT: 23 U/L (ref 0–53)
AST: 22 U/L (ref 0–37)
Albumin: 4.4 g/dL (ref 3.5–5.2)
BILIRUBIN TOTAL: 0.7 mg/dL (ref 0.2–1.2)
BUN: 21 mg/dL (ref 6–23)
CALCIUM: 9.4 mg/dL (ref 8.4–10.5)
CO2: 28 meq/L (ref 19–32)
CREATININE: 1.03 mg/dL (ref 0.40–1.50)
Chloride: 105 mEq/L (ref 96–112)
GFR: 74.82 mL/min (ref >60.00)
Glucose, Bld: 131 mg/dL — ABNORMAL HIGH (ref 70–99)
Potassium: 4.2 mEq/L (ref 3.5–5.1)
Sodium: 141 mEq/L (ref 135–145)
TOTAL PROTEIN: 6.9 g/dL (ref 6.0–8.3)

## 2018-04-03 LAB — LIPID PANEL
Cholesterol: 166 mg/dL (ref 0–200)
HDL: 52.3 mg/dL (ref >39.00)
LDL Cholesterol: 79 mg/dL (ref 0–99)
NONHDL: 113.84
TRIGLYCERIDES: 174 mg/dL — AB (ref 0.0–149.0)
Total CHOL/HDL Ratio: 3
VLDL: 34.8 mg/dL (ref 0.0–40.0)

## 2018-04-03 LAB — TSH: TSH: 3.08 u[IU]/mL (ref 0.35–4.50)

## 2018-04-03 LAB — MICROALBUMIN / CREATININE URINE RATIO
CREATININE, U: 190.4 mg/dL
Microalb Creat Ratio: 0.7 mg/g (ref 0.0–30.0)
Microalb, Ur: 1.3 mg/dL (ref 0.0–1.9)

## 2018-04-03 LAB — HEMOGLOBIN A1C: Hgb A1c MFr Bld: 6.8 % — ABNORMAL HIGH (ref 4.6–6.5)

## 2018-04-03 LAB — PSA, MEDICARE: PSA: 0.42 ng/mL (ref 0.10–4.00)

## 2018-04-03 MED ORDER — ZOSTER VAC RECOMB ADJUVANTED 50 MCG/0.5ML IM SUSR: 0.5000 mL | Freq: Once | INTRAMUSCULAR | 1 refills | Status: AC

## 2018-04-03 NOTE — Patient Instructions (Addendum)
Your fatigue, sleepiness and low pulse may all be from metoprolol. Stop the metoprolol immediately.  Continue your other blood pressure medication and let me know what your blood pressure is in one week (along with a heart rate)  We'll schedule a sleep to study if there is no improvement in your energy level in 2 weeks .    The ShingRx vaccine is now available in local pharmacies and is much more protective thant Zostavaxs,  It is therefore ADVISED for all interested adults over 50 to prevent shingles

## 2018-04-03 NOTE — Progress Notes (Signed)
Subjective:  Patient ID: Brett [name] Seen, male    DOB: [birth year]  Age: 77 y.o. MRN: [id number]  CC: The primary encounter diagnosis was Diabetes mellitus without complication ([place]). Diagnoses of Hyperlipidemia LDL goal <70, Hypothyroidism due to acquired atrophy of thyroid, Prostate cancer screening, Adjustment insomnia, Bradycardia with 41-50 beats per minute, and Hypersomnia were also pertinent to this visit.  HPI Youcef Hahne presents for  Follow up on multiple conditions   He has been taking cymbalta at dinner for anxiety.  He feels that the medication is Helping  Hypersomnolence , Can't stay awake.  Sleepy all day   Heart rate in the 40's.  Present for the last  4-5 months.  Not worse, no better.  Taking metoprolol    Outpatient Medications Prior to Visit  Medication Sig Dispense Refill  . aspirin 81 MG tablet Take 1 tablet (81 mg total) by mouth daily. 30 tablet 11  . atorvastatin (LIPITOR) 80 MG tablet TAKE 1 TABLET BY MOUTH EVERY DAY 90 tablet 1  . DULoxetine (CYMBALTA) 30 MG capsule Take 1 capsule (30 mg total) by mouth daily. 90 capsule 1  . glipiZIDE (GLUCOTROL) 5 MG tablet TAKE 1 TABLET (5 MG TOTAL) BY MOUTH DAILY BEFORE BREAKFAST. 90 tablet 0  . glucose blood (FREESTYLE LITE) test strip CHECK BLOOD SUGAR DAILY AS DIRECTED 100 each 5  . glucose monitoring kit (FREESTYLE) monitoring kit 1 each by Does not apply route as needed for other. For use daily to monitor diabetes.  Please include lancets .  Test once daily   E11.9 1 each 3  . Lancets (FREESTYLE) lancets Use as instructed 100 each 12  . levothyroxine (SYNTHROID, LEVOTHROID) 112 MCG tablet TAKE 1 TABLET BY MOUTH EVERY DAY 90 tablet 1  . metoprolol tartrate (LOPRESSOR) 25 MG tablet Take 0.5 tablets (12.5 mg total) by mouth 2 (two) times daily. 90 tablet 3  . tamsulosin (FLOMAX) 0.4 MG CAPS capsule TAKE 1 CAPSULE (0.4 MG TOTAL) DAILY BY MOUTH. 90 capsule 1  . telmisartan (MICARDIS) 20 MG tablet Take 1 tablet (20 mg total) by  mouth daily. 10 tablet 0   No facility-administered medications prior to visit.     Review of Systems;  Patient denies headache, fevers, malaise, unintentional weight loss, skin rash, eye pain, sinus congestion and sinus pain, sore throat, dysphagia,  hemoptysis , cough, dyspnea, wheezing, chest pain, palpitations, orthopnea, edema, abdominal pain, nausea, melena, diarrhea, constipation, flank pain, dysuria, hematuria, urinary  Frequency, nocturia, numbness, tingling, seizures,  Focal weakness, Loss of consciousness,  Tremor, insomnia, depression, anxiety, and suicidal ideation.      Objective:  BP 114/62 (BP Location: Left Arm, Patient Position: Sitting, Cuff Size: Normal)   Pulse (!) 58   Temp 98.2 F (36.8 C) (Oral)   Resp 15   Ht _0  (1.803 m)   Wt 193 lb 3.2 oz (87.6 kg)   SpO2 97%   BMI 26.95 kg/m   BP Readings from Last 3 Encounters:  04/03/18 114/62  07/20/17 (!) 110/58  05/18/17 120/64    Wt Readings from Last 3 Encounters:  04/03/18 193 lb 3.2 oz (87.6 kg)  07/20/17 189 lb 8 oz (86 kg)  05/18/17 186 lb 12.8 oz (84.7 kg)    General appearance: alert, cooperative and appears stated age Ears: normal TM's and external ear canals both ears Throat: lips, mucosa, and tongue normal; teeth and gums normal Neck: no adenopathy, no carotid bruit, supple, symmetrical, trachea midline and thyroid not enlarged, symmetric, no  tenderness/mass/nodules Back: symmetric, no curvature. ROM normal. No CVA tenderness. Lungs: clear to auscultation bilaterally Heart: regular rate and rhythm, S1, S2 normal, no murmur, click, rub or gallop Abdomen: soft, non-tender; bowel sounds normal; no masses,  no organomegaly Pulses: 2+ and symmetric Skin: Skin color, texture, turgor normal. No rashes or lesions Lymph nodes: Cervical, supraclavicular, and axillary nodes normal.  Lab Results  Component Value Date   HGBA1C 6.8 (H) 04/03/2018   HGBA1C 6.8 (H) 05/18/2017   HGBA1C 6.6 (H)  01/03/2017    Lab Results  Component Value Date   CREATININE 1.03 04/03/2018   CREATININE 1.05 05/18/2017   CREATININE 1.03 01/03/2017    Lab Results  Component Value Date   WBC 5.6 09/16/2016   HGB 14.3 09/16/2016   HCT 41.3 11/22/2013   PLT 189 09/16/2016   GLUCOSE 131 (H) 04/03/2018   CHOL 166 04/03/2018   TRIG 174.0 (H) 04/03/2018   HDL 52.30 04/03/2018   LDLDIRECT 193.0 05/18/2017   LDLCALC 79 04/03/2018   ALT 23 04/03/2018   AST 22 04/03/2018   NA 141 04/03/2018   K 4.2 04/03/2018   CL 105 04/03/2018   CREATININE 1.03 04/03/2018   BUN 21 04/03/2018   CO2 28 04/03/2018   TSH 3.08 04/03/2018   PSA 0.42 04/03/2018   HGBA1C 6.8 (H) 04/03/2018   MICROALBUR 1.3 04/03/2018    Ct Chest Wo Contrast  Result Date: 11/22/2016 CLINICAL DATA:  Follow-up lung nodule EXAM: CT CHEST WITHOUT CONTRAST TECHNIQUE: Multidetector CT imaging of the chest was performed following the standard protocol without IV contrast. COMPARISON:  07/01/2015, 01/07/2012 FINDINGS: Cardiovascular: Somewhat limited due to the lack of IV contrast. Aortic atherosclerotic disease is noted. Changes of prior coronary bypass grafting are seen. No aneurysmal dilatation is noted. No significant cardiac enlargement is seen. Mediastinum/Nodes: The thoracic inlet is within normal limits. No significant hilar or mediastinal adenopathy is noted. The esophagus as visualized is within normal limits. Lungs/Pleura: The right lung is well aerated without focal infiltrate or sizable parenchymal nodule. No effusions are seen. There is a stable left lower lobe soft tissue nodule which measures approximately 2.9 by 2.1 cm. Stable pleural thickening is noted adjacent to the lesion. A second subpleural nodule measuring 17 mm is noted and stable from the recent exam. No new focal nodules are seen. Minimal scarring is noted in the left upper lobe also stable from prior studies. Changes of bronchiectasis are noted on the left similar to  that seen on prior study. Upper Abdomen: Visualized upper abdomen demonstrates cholelithiasis. No complicating factors are seen. Musculoskeletal: The bony structures are within normal limits. IMPRESSION: Stable left upper and left lower lobe lesions dating back to 2013. This favors a benign process. No new focal abnormality is noted. Aortic Atherosclerosis (ICD10-I70.0). Electronically Signed   By: Inez Catalina M.D.   On: 11/22/2016 12:36    Assessment & Plan:   Problem List Items Addressed This Visit    Bradycardia with 41-50 beats per minute    Accompanied by hypersomnia.  Advised to suspend metoprolol.        Diabetes mellitus without complication ([place]) - Primary    Currently well-controlled on current medications .  hemoglobin A1c is at goal of less than 7.0 . Patient is reminded to schedule an annual eye exam and foot exam is normal today. Patient has no microalbuminuria. Patient has tolerated statin therapy for CAD risk reduction  and is taking an ACE/ARB for renal protection and hypertension  Lab Results  Component Value Date   HGBA1C 6.8 (H) 04/03/2018   Lab Results  Component Value Date   MICROALBUR 1.3 04/03/2018   Lab Results  Component Value Date   CHOL 166 04/03/2018   HDL 52.30 04/03/2018   LDLCALC 79 04/03/2018   LDLDIRECT 193.0 05/18/2017   TRIG 174.0 (H) 04/03/2018   CHOLHDL 3 04/03/2018         Relevant Orders   Hemoglobin A1c (Completed)   Comprehensive metabolic panel (Completed)   Microalbumin / creatinine urine ratio (Completed)   Hyperlipidemia LDL goal <70   Relevant Orders   Lipid panel (Completed)   Hypersomnia    With bradycardia .  Stopping metoprolol . Will order sleep study if symptoms persist      Hypothyroidism   Relevant Orders   TSH (Completed)   Insomnia    Resolved imporved with management of anxiety with cymbalta.        Other Visit Diagnoses    Prostate cancer screening       Relevant Orders   PSA, Medicare (Completed)        I am having Glyndon Hacking start on Zoster Vaccine Adjuvanted. I am also having him maintain his aspirin, glucose monitoring kit, freestyle, metoprolol tartrate, DULoxetine, glucose blood, levothyroxine, tamsulosin, glipiZIDE, atorvastatin, and telmisartan.  Meds ordered this encounter  Medications  . Zoster Vaccine Adjuvanted [facility]) injection    Sig: Inject 0.5 mLs into the muscle once for 1 dose.    Dispense:  1 each    Refill:  1    There are no discontinued medications.  Follow-up: Return in about 6 months (around 10/03/2018) for follow up diabetes.   Crecencio Mc, MD

## 2018-04-04 DIAGNOSIS — R001 Bradycardia, unspecified: Secondary | ICD-10-CM | POA: Insufficient documentation

## 2018-04-04 DIAGNOSIS — G4710 Hypersomnia, unspecified: Secondary | ICD-10-CM | POA: Insufficient documentation

## 2018-04-04 NOTE — Assessment & Plan Note (Signed)
Accompanied by hypersomnia.  Advised to suspend metoprolol.

## 2018-04-04 NOTE — Assessment & Plan Note (Signed)
With bradycardia .  Stopping metoprolol . Will order sleep study if symptoms persist

## 2018-04-04 NOTE — Assessment & Plan Note (Signed)
Resolved imporved with management of anxiety with cymbalta.

## 2018-04-04 NOTE — Assessment & Plan Note (Signed)
Currently well-controlled on current medications .  hemoglobin A1c is at goal of less than 7.0 . Patient is reminded to schedule an annual eye exam and foot exam is normal today. Patient has no microalbuminuria. Patient has tolerated statin therapy for CAD risk reduction  and is taking an ACE/ARB for renal protection and hypertension   Lab Results  Component Value Date   HGBA1C 6.8 (H) 04/03/2018   Lab Results  Component Value Date   MICROALBUR 1.3 04/03/2018   Lab Results  Component Value Date   CHOL 166 04/03/2018   HDL 52.30 04/03/2018   LDLCALC 79 04/03/2018   LDLDIRECT 193.0 05/18/2017   TRIG 174.0 (H) 04/03/2018   CHOLHDL 3 04/03/2018

## 2018-04-07 ENCOUNTER — Other Ambulatory Visit: Payer: Self-pay | Admitting: Internal Medicine

## 2018-04-30 NOTE — Progress Notes (Signed)
Cardiology Office Note  Date:  05/01/2018   ID:  Brett [name], Brett [name] August 01, 1940, MRN [id number]  PCP:  Brett Mc, MD   Chief Complaint  Patient presents with  . other    low HR. Medications reviewed verbally.     HPI:  [name] is a very pleasant 77 year old gentleman with history of  HTN,   DM II, HBA1C 6.7,  Hyperlipidemia, total chol 169, LDL 97 CAD,  CABG x4 at [facility]  in 2006,  previous stents Depression/insomnia Who presents for routine follow-up of his coronary artery disease  In general reports he is feeling well He does report symptoms of cold feet, cramps in legs when walking long distances Continues to exercise 4 days a week walks 45 minutes Denies any chest pain or shortness of breath on exertion Good exercise tolerance  Compliant with his cholesterol medication On last visit had stopped his statin, LDL up to 195 Prior to that was 85 Has been compliant with the Lipitor now down to 77  Asymptomatic bradycardia on last clinic visit Has since stopped his metoprolol  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  Was playing soccer and felt some cramping in his leg and stopped the medication  EKG personally reviewed by myself on todays visit Shows normal sinus rhythm rate 59 bpm T wave abnormality V3 through V6, 1 and aVL  PMH:   has a past medical history of Allergy, Calcific Achilles tendonitis, Calculus of ureter, Coronary artery disease, Diabetes mellitus, H/O renal calculi (2012), Hematuria, unspecified, Hyperlipidemia, Hypertension, Impotence of organic origin, Myocardial infarction [facility]), Peyronie's disease, Pulmonary nodule seen on imaging study (2012), Sleep difficulties, Swelling, mass, or lump in chest, and Unspecified hypothyroidism.  PSH:    Past Surgical History:  Procedure Laterality Date  . CARDIAC CATHETERIZATION  2001    [facility] x3 stent  . COLONOSCOPY    . COLONOSCOPY WITH PROPOFOL N/A 01/24/2017   Procedure: COLONOSCOPY WITH PROPOFOL;  Surgeon: Brett Silvas, MD;  Location: [facility] ENDOSCOPY;  Service: Endoscopy;  Laterality: N/A;  . CORONARY ARTERY BYPASS GRAFT  2006   4 vessel, after 3 or 4 stents   . CORONARY ARTERY BYPASS GRAFT    . OSTECTOMY CALCANEUS    . PENILE PROSTHESIS IMPLANT      Current Outpatient Medications  Medication Sig Dispense Refill  . aspirin 81 MG tablet Take 1 tablet (81 mg total) by mouth daily. 30 tablet 11  . atorvastatin (LIPITOR) 80 MG tablet TAKE 1 TABLET BY MOUTH EVERY DAY 90 tablet 1  . DULoxetine (CYMBALTA) 30 MG capsule Take 1 capsule (30 mg total) by mouth daily. 90 capsule 1  . glipiZIDE (GLUCOTROL) 5 MG tablet TAKE 1 TABLET (5 MG TOTAL) BY MOUTH DAILY BEFORE BREAKFAST. 90 tablet 0  . glucose blood (FREESTYLE LITE) test strip CHECK BLOOD SUGAR DAILY AS DIRECTED 100 each 5  . glucose monitoring kit (FREESTYLE) monitoring kit 1 each by Does not apply route as needed for other. For use daily to monitor diabetes.  Please include lancets .  Test once daily   E11.9 1 each 3  . Lancets (FREESTYLE) lancets Use as instructed 100 each 12  . levothyroxine (SYNTHROID, LEVOTHROID) 112 MCG tablet TAKE 1 TABLET BY MOUTH EVERY DAY 90 tablet 1  . metoprolol tartrate (LOPRESSOR) 25 MG tablet Take 0.5 tablets (12.5 mg total) by mouth 2 (two) times daily.  90 tablet 3  . tamsulosin (FLOMAX) 0.4 MG CAPS capsule TAKE 1 CAPSULE (0.4 MG TOTAL) DAILY BY MOUTH. 90 capsule 1  . telmisartan (MICARDIS) 40 MG tablet Take 1 tablet (40 mg total) by mouth daily. 90 tablet 3   No current facility-administered medications for this visit.      Allergies:   Patient has no known allergies.   Social History:  The patient  reports that he quit smoking about 36 years ago. His smoking use included cigars. He has never used smokeless tobacco. He reports that he drinks about 1.0 standard  drinks of alcohol per week. He reports that he does not use drugs.   Family History:   family history includes AAA (abdominal aortic aneurysm) in his mother; Heart disease (age of onset: 30) in his father; Hyperlipidemia in his mother.    Review of Systems: Review of Systems  Constitutional: Negative.   Respiratory: Negative.   Cardiovascular: Negative.   Gastrointestinal: Negative.   Musculoskeletal: Positive for myalgias.       Cold feet  Neurological: Negative.   Psychiatric/Behavioral: Negative.   All other systems reviewed and are negative.    PHYSICAL EXAM: VS:  BP 130/60 (BP Location: Left Arm, Patient Position: Sitting, Cuff Size: Normal)   Pulse (!) 59   Ht 5' 11"  (1.803 m)   Wt 193 lb (87.5 kg)   BMI 26.92 kg/m  , BMI Body mass index is 26.92 kg/m. Constitutional:  oriented to person, place, and time. No distress.  HENT:  Head: Grossly normal Eyes:  no discharge. No scleral icterus.  Neck: No JVD, no carotid bruits  Cardiovascular: Regular rate and rhythm, no murmurs appreciated Intact lower extremity pulses Pulmonary/Chest: Clear to auscultation bilaterally, no wheezes or rails Abdominal: Soft.  no distension.  no tenderness.  Musculoskeletal: Normal range of motion Neurological:  normal muscle tone. Coordination normal. 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 No changes made to the medications.  Diabetes mellitus without complication (HCC) Hemoglobin A1c less than 7  recommend he continue walking program, low carbohydrate diet  Hyperlipidemia LDL goal <70 He will stay on Lipitor without which he had 100 point climbing LDL   Total encounter time more than 25 minutes  Greater than 50% was spent in counseling and coordination of care with the patient  Disposition:   F/U  12 months   Orders Placed This Encounter  Procedures  . EKG 12-Lead     Signed, Esmond Plants, M.D., Ph.D. 05/01/2018  [place]

## 2018-05-01 ENCOUNTER — Ambulatory Visit: Payer: PPO | Admitting: Cardiovascular Disease

## 2018-05-01 ENCOUNTER — Encounter: Payer: Self-pay | Admitting: Cardiovascular Disease

## 2018-05-01 VITALS — BP 130/60 | HR 59 | Ht 71.0 in | Wt 193.0 lb

## 2018-05-01 DIAGNOSIS — E785 Hyperlipidemia, unspecified: Secondary | ICD-10-CM

## 2018-05-01 DIAGNOSIS — I25708 Atherosclerosis of coronary artery bypass graft(s), unspecified, with other forms of angina pectoris: Secondary | ICD-10-CM | POA: Diagnosis not present

## 2018-05-01 DIAGNOSIS — R001 Bradycardia, unspecified: Secondary | ICD-10-CM

## 2018-05-01 DIAGNOSIS — Z951 Presence of aortocoronary bypass graft: Secondary | ICD-10-CM | POA: Diagnosis not present

## 2018-05-01 DIAGNOSIS — E119 Type 2 diabetes mellitus without complications: Secondary | ICD-10-CM

## 2018-05-01 DIAGNOSIS — I10 Essential (primary) hypertension: Secondary | ICD-10-CM

## 2018-05-01 MED ORDER — TELMISARTAN 40 MG PO TABS: 40.0000 mg | ORAL_TABLET | Freq: Every day | ORAL | 3 refills | Status: DC

## 2018-05-01 NOTE — Patient Instructions (Signed)

## 2018-05-08 ENCOUNTER — Other Ambulatory Visit: Payer: Self-pay | Admitting: Internal Medicine

## 2018-05-12 ENCOUNTER — Ambulatory Visit: Payer: PPO

## 2018-05-17 ENCOUNTER — Ambulatory Visit (INDEPENDENT_AMBULATORY_CARE_PROVIDER_SITE_OTHER): Payer: PPO

## 2018-05-17 VITALS — BP 110/64 | HR 66 | Temp 98.0°F | Resp 15 | Ht 70.5 in | Wt 193.8 lb

## 2018-05-17 DIAGNOSIS — Z0000 Encounter for general adult medical examination without abnormal findings: Secondary | ICD-10-CM

## 2018-05-17 NOTE — Progress Notes (Addendum)
Subjective:   Brett [name] is a 77 y.o. male who presents for Medicare Annual/Subsequent preventive examination.  Review of Systems:  No ROS.  Medicare Wellness Visit. Additional risk factors are reflected in the social history. Cardiac Risk Factors include: advanced age (>79mn, >>26women);hypertension;diabetes mellitus;male gender     Objective:    Vitals: BP 110/64 (BP Location: Left Arm, Patient Position: Sitting, Cuff Size: Normal)   Pulse 66   Temp 98 F (36.7 C) (Oral)   Resp 15   Ht 5' 10.5" (1.791 m)   Wt 193 lb 12.8 oz (87.9 kg)   SpO2 96%   BMI 27.41 kg/m   Body mass index is 27.41 kg/m.  Advanced Directives 05/17/2018 05/11/2017 01/24/2017 05/11/2016 05/07/2015  Does Patient Have a Medical Advance Directive? Yes Yes No Yes Yes  Type of AParamedicof [place]am[place]Living will [place]Living will - H[place]astonLiving will HColumbusLiving will  Does patient want to make changes to medical advance directive? No - Patient declined No - Patient declined - - -  Copy of HPonce de Leonin Chart? No - copy requested No - copy requested - No - copy requested No - copy requested    Tobacco Social History   Tobacco Use  Smoking Status Former Smoker  . Types: Cigars  . Last attempt to quit: 10/04/1981  . Years since quitting: 36.6  Smokeless Tobacco Never Used     Counseling given: Not Answered   Clinical Intake:  Pre-visit preparation completed: Yes  Pain : No/denies pain     Nutritional Status: BMI 25 -29 Overweight Diabetes: Yes(Followed by pcp)  How often do you need to have someone help you when you read instructions, pamphlets, or other written materials from your doctor or pharmacy?: 1 - Never  Interpreter Needed?: No     Past Medical History:  Diagnosis Date  . Allergy    resolved after CABG  . Calcific Achilles tendonitis   . Calculus of ureter   . Coronary  artery disease   . Diabetes mellitus   . H/O renal calculi 2012  . Hematuria, unspecified   . Hyperlipidemia   . Hypertension   . Impotence of organic origin   . [place]   . Peyronie's disease   . Pulmonary nodule seen on imaging study 2012  . Sleep difficulties   . Swelling, mass, or lump in chest   . Unspecified hypothyroidism    Past Surgical History:  Procedure Laterality Date  . CARDIAC CATHETERIZATION  2001   [facility] x3 stent  . COLONOSCOPY    . COLONOSCOPY WITH PROPOFOL N/A 01/24/2017   Procedure: COLONOSCOPY WITH PROPOFOL;  Surgeon: EManya Silvas MD;  [facility];  Service: Endoscopy;  Laterality: N/A;  . CORONARY ARTERY BYPASS GRAFT  2006   4 vessel, after 3 or 4 stents   . CORONARY ARTERY BYPASS GRAFT    . OSTECTOMY CALCANEUS    . PENILE PROSTHESIS IMPLANT     Family History  Problem Relation Age of Onset  . Hyperlipidemia Mother   . AAA (abdominal aortic aneurysm) Mother   . Heart disease Father 720  Social History   Socioeconomic History  . Marital status: Married    Spouse name: MAudelia Actonnow deceased  . Number of children: Not on file  . Years of education: Not on file  . Highest education level: Not on file  Occupational History  Employer: retired  Scientific laboratory technician  . Financial resource strain: Not hard at all  . Food insecurity:    Worry: Never true    Inability: Never true  . Transportation needs:    Medical: No    Non-medical: No  Tobacco Use  . Smoking status: Former Smoker    Types: Cigars    Last attempt to quit: 10/04/1981    Years since quitting: 36.6  . Smokeless tobacco: Never Used  Substance and Sexual Activity  . Alcohol use: Yes    Alcohol/week: 1.0 standard drinks    Types: 1 Cans of beer per week    Comment: everyday  . Drug use: No  . Sexual activity: Not Currently  Lifestyle  . Physical activity:    Days per week: Not on file    Minutes per session: Not on file  . Stress: Not  on file  Relationships  . Social connections:    Talks on phone: Not on file    Gets together: Not on file    Attends religious service: Not on file    Active member of club or organization: Not on file    Attends meetings of clubs or organizations: Not on file    Relationship status: Not on file  Other Topics Concern  . Not on file  Social History Narrative  . Not on file    Outpatient Encounter Medications as of 05/17/2018  Medication Sig  . aspirin 81 MG tablet Take 1 tablet (81 mg total) by mouth daily.  Marland Kitchen atorvastatin (LIPITOR) 80 MG tablet TAKE 1 TABLET BY MOUTH EVERY DAY  . DULoxetine (CYMBALTA) 30 MG capsule TAKE 1 CAPSULE BY MOUTH EVERY DAY  . glipiZIDE (GLUCOTROL) 5 MG tablet TAKE 1 TABLET (5 MG TOTAL) BY MOUTH DAILY BEFORE BREAKFAST.  Marland Kitchen glucose blood (FREESTYLE LITE) test strip CHECK BLOOD SUGAR DAILY AS DIRECTED  . glucose monitoring kit (FREESTYLE) monitoring kit 1 each by Does not apply route as needed for other. For use daily to monitor diabetes.  Please include lancets .  Test once daily   E11.9  . Lancets (FREESTYLE) lancets Use as instructed  . levothyroxine (SYNTHROID, LEVOTHROID) 112 MCG tablet TAKE 1 TABLET BY MOUTH EVERY DAY  . tamsulosin (FLOMAX) 0.4 MG CAPS capsule TAKE 1 CAPSULE (0.4 MG TOTAL) DAILY BY MOUTH.  Marland Kitchen telmisartan (MICARDIS) 40 MG tablet Take 1 tablet (40 mg total) by mouth daily.  . [DISCONTINUED] metoprolol tartrate (LOPRESSOR) 25 MG tablet Take 0.5 tablets (12.5 mg total) by mouth 2 (two) times daily.   No facility-administered encounter medications on file as of 05/17/2018.     Activities of Daily Living In your present state of health, do you have any difficulty performing the following activities: 05/17/2018  Hearing? N  Vision? N  Difficulty concentrating or making decisions? N  Walking or climbing stairs? N  Dressing or bathing? N  Doing errands, shopping? N  Preparing Food and eating ? N  Using the Toilet? N  In the past six  months, have you accidently leaked urine? N  Do you have problems with loss of bowel control? N  Managing your Medications? N  Managing your Finances? N  Housekeeping or managing your Housekeeping? N  Some recent data might be hidden    Patient Care Team: Crecencio Mc, MD as PCP - General (Internal Medicine) Minna Merritts, MD as Consulting Physician (Cardiology)   Assessment:   This is a routine wellness examination for Brett [name].  C/O R  hip pain when lying down for sleep.  Sleeps 5-6 hours, naps during the day. No pain when standing, walking or climbing stairs. Onset x2 months. Taking no medications for this pain. Follow up appointment offered and declined.  He plans to monitor for changes and keep all scheduled routine appointments for follow up.     Notes FBS averaging [place]  Colonoscopy- due 01/24/22 PSA- 0.42 Glaucoma- no Retinopathy-no Vision exam due 03/2019- [facility]  Hearing-passes the whisper test Hemoglobin A1C- 6.8 Cholesterol-166 Fasting labs completed 04/03/18 Dental- every 6 months Foot exam-completed by pcp. Normal.  Social  Alcohol intake- 4 to 5 per week Smoking history- former Smokers in home? none Illicit drug use?no Exercise- walking Diet-diabetic Sexually Active-not currently Multiple Partners-no  Safety  Patient feesl safe at home-yes Patient does have smoke detectors at home-yes Patient does wear sunscreen or protective clothing when in direct sunlight-yes Patient does wear seat belt when driving or riding with others-yes  Activities of Daily Living Patient can do their own household chores. Denies needing assistance with: driving, feeding themselves, getting from bed to chair, getting to the toilet, bathing/showering, dressing, managing money, climbing flight of stairs, or preparing meals.   Depression Screen Patient denies losing interest in daily life, feeling hopeless, or crying easily over simple problems. Taking all  medications as directed.  Fall Screen Patient denies being afraid of falling or falling in the last year.   Memory Screen Patient denies problems with memory, misplacing items, and is able to balance checkbook/bank accounts.  Patient is alert, normal appearance, oriented to person/place/and time. Correctly identified the president of the [place], recall of 2 objects, and performing simple calculations.  Patient displays appropriate judgement and can read correct time from watch face.   Immunizations The following Immunizations are up to date: Influenza, shingles, pneumonia, and tetanus.   Other Providers Patient Care Team: Crecencio Mc, MD as PCP - General (Internal Medicine) Minna Merritts, MD as Consulting Physician (Cardiology)   Exercise Activities and Dietary recommendations Current Exercise Habits: Home exercise routine, Type of exercise: walking, Time (Minutes): 60, Frequency (Times/Week): 3, Weekly Exercise (Minutes/Week): 180, Intensity: Mild  Goals      Patient Stated   . Maintain Healthy Lifestyle  (pt-stated)     Brain engagement activity, per discussion Walk more for exercise       Fall Risk Fall Risk  05/17/2018 05/11/2017 05/06/2016 05/07/2015 05/30/2014  Falls in the past year? 0 No No No No   Depression Screen PHQ 2/9 Scores 05/17/2018 05/11/2017 05/06/2016 05/07/2015  PHQ - 2 Score 0 3 0 2  PHQ- 9 Score - 5 - 3    Cognitive Function MMSE - Mini Mental State Exam 05/11/2017 05/07/2015  Orientation to time 5 5  Orientation to Place 5 5  Registration 3 3  Attention/ Calculation 5 5  Recall 3 3  Language- name 2 objects 2 2  Language- repeat 1 1  Language- follow 3 step command 3 3  Language- read & follow direction 1 1  Write a sentence 1 1  Copy design 1 1  Total score 30 30     6CIT Screen 05/17/2018 05/11/2016  What Year? 0 points 0 points  What month? 0 points 0 points  What time? 0 points 0 points  Count back from 20 0 points 0 points    Months in reverse 0 points 0 points  Repeat phrase - 0 points  Total Score - 0    Immunization  History  Administered Date(s) Administered  . Influenza Split 03/16/2013, 02/11/2015  . Influenza Whole 02/14/2012  . Influenza-Unspecified 02/05/2013, 02/11/2015, 02/27/2016, 02/20/2018  . Pneumococcal Conjugate-13 03/16/2013  . Pneumococcal Polysaccharide-23 07/08/2009, 06/25/2015  . Tdap 03/16/2013   Screening Tests Health Maintenance  Topic Date Due  . HEMOGLOBIN A1C  10/03/2018  . OPHTHALMOLOGY EXAM  03/10/2019  . FOOT EXAM  04/04/2019  . COLONOSCOPY  01/24/2022  . TETANUS/TDAP  03/17/2023  . INFLUENZA VACCINE  Completed  . PNA vac Low Risk Adult  Completed       Plan:    End of life planning; Advance aging; Advanced directives discussed. Copy of current HCPOA/Living Will requested.    Keep all routine scheduled appointments.   I have personally reviewed and noted the following in the patient's chart:   . Medical and social history . Use of alcohol, tobacco or illicit drugs  . Current medications and supplements . Functional ability and status . Nutritional status . Physical activity . Advanced directives . List of other physicians . Hospitalizations, surgeries, and ER visits in previous 12 months . Vitals . Screenings to include cognitive, depression, and falls . Referrals and appointments  In addition, I have reviewed and discussed with patient certain preventive protocols, quality metrics, and best practice recommendations. A written personalized care plan for preventive services as well as general preventive health recommendations were provided to patient.     OBrien-Blaney, Xuan Mateus L, LPN  10/68/1661   I have reviewed the above information and agree with above.   Deborra Medina, MD

## 2018-05-17 NOTE — Patient Instructions (Addendum)
  Brett [name] , Thank you for taking time to come for your Medicare Wellness Visit. I appreciate your ongoing commitment to your health goals. Please review the following plan we discussed and let me know if I can assist you in the future.   Follow up as needed for R hip pain with your doctor.   Bring a copy of your [place] and/or Living Will to be scanned into chart.  Happy Holidays!  These are the goals we discussed: Goals      Patient Stated   . Maintain Healthy Lifestyle  (pt-stated)     Brain engagement activity, per discussion Walk more for exercise       This is a list of the screening recommended for you and due dates:  Health Maintenance  Topic Date Due  . Hemoglobin A1C  10/03/2018  . Eye exam for diabetics  03/10/2019  . Complete foot exam   04/04/2019  . Colon Cancer Screening  01/24/2022  . Tetanus Vaccine  03/17/2023  . Flu Shot  Completed  . Pneumonia vaccines  Completed

## 2018-05-30 ENCOUNTER — Other Ambulatory Visit: Payer: Self-pay | Admitting: Internal Medicine

## 2018-05-30 ENCOUNTER — Telehealth: Payer: Self-pay

## 2018-05-30 DIAGNOSIS — E034 Atrophy of thyroid (acquired): Secondary | ICD-10-CM

## 2018-05-30 MED ORDER — LEVOFLOXACIN 500 MG PO TABS: 500.0000 mg | ORAL_TABLET | Freq: Every day | ORAL | 0 refills | Status: DC

## 2018-05-30 MED ORDER — PROMETHAZINE HCL 12.5 MG PO TABS: 12.5000 mg | ORAL_TABLET | Freq: Three times a day (TID) | ORAL | 0 refills | Status: DC | PRN

## 2018-05-30 NOTE — Telephone Encounter (Signed)
Copied from [place] [id number]. Topic: General - Other >> May 30, 2018  9:52 AM Yvette Rack wrote: Reason for CRM: Pt stated that they are about to go on a cruise and Dr. Derrel Nip normally prescribes them something for vomiting and some antibiotics. Pt requests that the prescriptions be sent to [id number] - [place], [place].  Cb# [phone]

## 2018-05-30 NOTE — Addendum Note (Signed)
Addended by: Crecencio Mc on: 05/30/2018 11:58 AM   Modules accepted: Orders

## 2018-05-30 NOTE — Telephone Encounter (Signed)
Done,

## 2018-06-12 ENCOUNTER — Other Ambulatory Visit: Payer: Self-pay | Admitting: Internal Medicine

## 2018-07-19 DIAGNOSIS — H02132 Senile ectropion of right lower eyelid: Secondary | ICD-10-CM | POA: Diagnosis not present

## 2018-07-19 DIAGNOSIS — H02835 Dermatochalasis of left lower eyelid: Secondary | ICD-10-CM | POA: Diagnosis not present

## 2018-07-19 DIAGNOSIS — H04223 Epiphora due to insufficient drainage, bilateral lacrimal glands: Secondary | ICD-10-CM | POA: Diagnosis not present

## 2018-07-19 DIAGNOSIS — H02834 Dermatochalasis of left upper eyelid: Secondary | ICD-10-CM | POA: Diagnosis not present

## 2018-07-19 DIAGNOSIS — H0288B Meibomian gland dysfunction left eye, upper and lower eyelids: Secondary | ICD-10-CM | POA: Diagnosis not present

## 2018-07-19 DIAGNOSIS — H02135 Senile ectropion of left lower eyelid: Secondary | ICD-10-CM | POA: Diagnosis not present

## 2018-07-19 DIAGNOSIS — H02832 Dermatochalasis of right lower eyelid: Secondary | ICD-10-CM | POA: Diagnosis not present

## 2018-07-19 DIAGNOSIS — H0288A Meibomian gland dysfunction right eye, upper and lower eyelids: Secondary | ICD-10-CM | POA: Diagnosis not present

## 2018-07-19 DIAGNOSIS — H02831 Dermatochalasis of right upper eyelid: Secondary | ICD-10-CM | POA: Diagnosis not present

## 2018-07-25 ENCOUNTER — Other Ambulatory Visit: Payer: Self-pay | Admitting: Internal Medicine

## 2018-09-19 ENCOUNTER — Other Ambulatory Visit: Payer: Self-pay | Admitting: Internal Medicine

## 2018-09-30 ENCOUNTER — Other Ambulatory Visit: Payer: Self-pay | Admitting: Internal Medicine

## 2018-10-04 ENCOUNTER — Other Ambulatory Visit: Payer: Self-pay

## 2018-10-04 ENCOUNTER — Ambulatory Visit (INDEPENDENT_AMBULATORY_CARE_PROVIDER_SITE_OTHER): Payer: PPO | Admitting: Internal Medicine

## 2018-10-04 ENCOUNTER — Encounter: Payer: Self-pay | Admitting: Internal Medicine

## 2018-10-04 DIAGNOSIS — E119 Type 2 diabetes mellitus without complications: Secondary | ICD-10-CM

## 2018-10-04 DIAGNOSIS — E785 Hyperlipidemia, unspecified: Secondary | ICD-10-CM

## 2018-10-04 DIAGNOSIS — I10 Essential (primary) hypertension: Secondary | ICD-10-CM

## 2018-10-04 DIAGNOSIS — F418 Other specified anxiety disorders: Secondary | ICD-10-CM

## 2018-10-04 DIAGNOSIS — E034 Atrophy of thyroid (acquired): Secondary | ICD-10-CM

## 2018-10-04 NOTE — Progress Notes (Signed)
Telephone Note  This visit type was conducted due to national recommendations for restrictions regarding the COVID-19 pandemic (e.g. social distancing).  This format is felt to be most appropriate for this patient at this time.  All issues noted in this document were discussed and addressed.  No physical exam was performed (except for noted visual exam findings with Video Visits).   I connected with@ on 10/04/18 at  2:00 PM EDT by  telephone and verified that I am speaking with the correct person using two identifiers. Location patient: home Location provider: work  Persons participating in the virtual visit: patient, provider  I discussed the limitations, risks, security and privacy concerns of performing an evaluation and management service by telephone and the availability of in person appointments. I also discussed with the patient that there may be a patient responsible charge related to this service. The patient expressed understanding and agreed to proceed.Reason for visit:   HPI:  6 month follow up on DM and hypertension    6 month follow up on diabetes.  Patient has no complaints today.  Patient is following a low glycemic index diet and taking all prescribed medications regularly without side effects.  Fasting sugars have been under less than 140 most of the time and post prandials have been under 160 except on rare occasions. Patient is exercising about 3 times per week and intentionally trying to lose weight .  Patient has had an eye exam in the last 12 months and checks feet regularly for signs of infection.  Patient does not walk barefoot outside,  And denies an numbness tingling or burning in feet. Patient is up to date on all recommended vaccinations     HTN:  126 /74  Hr 52  "My Pulse has improved since Dr Gollan reduced my telmisartan to 20 mg daily"   Has had rare episodes of Mild orthostasis with sudden position changes,  Lasts < 10 sec,  No falls,   wallking  For exercise 4  days per week .  Taking Flomax  Happily remarried after losing wife to dementia.  ROS: See pertinent positives and negatives per HPI.  Past Medical History:  Diagnosis Date  . Allergy    resolved after CABG  . Calcific Achilles tendonitis   . Calculus of ureter   . Coronary artery disease   . Diabetes mellitus   . H/O renal calculi 2012  . Hematuria, unspecified   . Hyperlipidemia   . Hypertension   . Impotence of organic origin   . Myocardial infarction (HCC)   . Peyronie's disease   . Pulmonary nodule seen on imaging study 2012  . Sleep difficulties   . Swelling, mass, or lump in chest   . Unspecified hypothyroidism     Past Surgical History:  Procedure Laterality Date  . CARDIAC CATHETERIZATION  2001   [facility] x3 stent  . COLONOSCOPY    . COLONOSCOPY WITH PROPOFOL N/A 01/24/2017   Procedure: COLONOSCOPY WITH PROPOFOL;  Surgeon: Elliott, Robert T, MD;  Location: [facility] ENDOSCOPY;  Service: Endoscopy;  Laterality: N/A;  . CORONARY ARTERY BYPASS GRAFT  2006   4 vessel, after 3 or 4 stents   . CORONARY ARTERY BYPASS GRAFT    . OSTECTOMY CALCANEUS    . PENILE PROSTHESIS IMPLANT      Family History  Problem Relation Age of Onset  . Hyperlipidemia Mother   . AAA (abdominal aortic aneurysm) Mother   . Heart disease Father 71      SOCIAL LF:YBOFBPZ,    Current Outpatient Medications:  .  atorvastatin (LIPITOR) 80 MG tablet, TAKE 1 TABLET BY MOUTH EVERY DAY, Disp: 90 tablet, Rfl: 1 .  DULoxetine (CYMBALTA) 30 MG capsule, TAKE 1 CAPSULE BY MOUTH EVERY DAY, Disp: 90 capsule, Rfl: 1 .  glipiZIDE (GLUCOTROL) 5 MG tablet, TAKE 1 TABLET (5 MG TOTAL) BY MOUTH DAILY BEFORE BREAKFAST., Disp: 90 tablet, Rfl: 0 .  glucose blood (FREESTYLE LITE) test strip, CHECK BLOOD SUGAR DAILY AS DIRECTED, Disp: 100 each, Rfl: 5 .  glucose monitoring kit (FREESTYLE) monitoring kit, 1 each by Does not apply route as needed for other. For use daily to monitor diabetes.  Please include  lancets .  Test once daily   E11.9, Disp: 1 each, Rfl: 3 .  Lancets (FREESTYLE) lancets, Use as instructed, Disp: 100 each, Rfl: 12 .  levothyroxine (SYNTHROID, LEVOTHROID) 112 MCG tablet, TAKE 1 TABLET BY MOUTH EVERY DAY, Disp: 90 tablet, Rfl: 1 .  tamsulosin (FLOMAX) 0.4 MG CAPS capsule, TAKE 1 CAPSULE (0.4 MG TOTAL) DAILY BY MOUTH., Disp: 90 capsule, Rfl: 1 .  telmisartan (MICARDIS) 40 MG tablet, Take 1 tablet (40 mg total) by mouth daily., Disp: 90 tablet, Rfl: 3    General impression: alert, cooperative and articulate.  No signs of being in distress  Lungs: speech is fluent sentence length suggests that patient is not short of breath and not punctuated by cough, sneezing or sniffing. Marland Kitchen   Psych: affect normal.  speech is articulate and non pressured .  Denies suicidal thoughts   ASSESSMENT AND PLAN:  Discussed the following assessment and plan:  Hyperlipidemia LDL goal <70 - Plan: Lipid panel  Hypothyroidism due to acquired atrophy of thyroid - Plan: TSH  Diabetes mellitus without complication ([place]) - Plan: Hemoglobin A1c, Comprehensive metabolic panel  Essential hypertension  Depression with anxiety  Hyperlipidemia LDL goal <70 Well controlled on 80  Mg Lipitor. .   Liver enzymes are due, , no changes today.  Lab Results  Component Value Date   CHOL 166 04/03/2018   HDL 52.30 04/03/2018   LDLCALC 79 04/03/2018   LDLDIRECT 193.0 05/18/2017   TRIG 174.0 (H) 04/03/2018   CHOLHDL 3 04/03/2018   Lab Results  Component Value Date   ALT 23 04/03/2018   AST 22 04/03/2018   ALKPHOS 64 04/03/2018   BILITOT 0.7 04/03/2018         Diabetes mellitus without complication ([place]) Historically  well-controlled on current medications .  hemoglobin A1c is due and has been at goal of less than 7.0 . Patient is reminded to schedule an annual eye exam and foot exam is normal today. Patient has no microalbuminuria. Patient has tolerated statin therapy for CAD risk reduction  and is  taking an ACE/ARB for renal protection and hypertension   Lab Results  Component Value Date   HGBA1C 6.8 (H) 04/03/2018   Lab Results  Component Value Date   MICROALBUR 1.3 04/03/2018   Lab Results  Component Value Date   CHOL 166 04/03/2018   HDL 52.30 04/03/2018   LDLCALC 79 04/03/2018   LDLDIRECT 193.0 05/18/2017   TRIG 174.0 (H) 04/03/2018   CHOLHDL 3 04/03/2018     Hypertension Well controlled on current regimen. Renal function is due , no changes today.  Depression with anxiety Improved with trial of  duloxetine  30 mg  .  No changes today     I discussed the assessment and treatment plan with the patient. The patient  was provided an opportunity to ask questions and all were answered. The patient agreed with the plan and demonstrated an understanding of the instructions.   The patient was advised to call back or seek an in-person evaluation if the symptoms worsen or if the condition fails to improve as anticipated.  I provided 20 minutes of non-face-to-face time during this encounter.    L , MD  

## 2018-10-05 NOTE — Assessment & Plan Note (Signed)
Historically  well-controlled on current medications .  hemoglobin A1c is due and has been at goal of less than 7.0 . Patient is reminded to schedule an annual eye exam and foot exam is normal today. Patient has no microalbuminuria. Patient has tolerated statin therapy for CAD risk reduction  and is taking an ACE/ARB for renal protection and hypertension   Lab Results  Component Value Date   HGBA1C 6.8 (H) 04/03/2018   Lab Results  Component Value Date   MICROALBUR 1.3 04/03/2018   Lab Results  Component Value Date   CHOL 166 04/03/2018   HDL 52.30 04/03/2018   LDLCALC 79 04/03/2018   LDLDIRECT 193.0 05/18/2017   TRIG 174.0 (H) 04/03/2018   CHOLHDL 3 04/03/2018

## 2018-10-05 NOTE — Assessment & Plan Note (Addendum)
Well controlled on 80  Mg Lipitor. .   Liver enzymes are due, , no changes today.  Lab Results  Component Value Date   CHOL 166 04/03/2018   HDL 52.30 04/03/2018   LDLCALC 79 04/03/2018   LDLDIRECT 193.0 05/18/2017   TRIG 174.0 (H) 04/03/2018   CHOLHDL 3 04/03/2018   Lab Results  Component Value Date   ALT 23 04/03/2018   AST 22 04/03/2018   ALKPHOS 64 04/03/2018   BILITOT 0.7 04/03/2018

## 2018-10-05 NOTE — Assessment & Plan Note (Signed)
Well controlled on current regimen. Renal function is due, no changes today. °

## 2018-10-05 NOTE — Assessment & Plan Note (Addendum)
Improved with trial of  duloxetine  30 mg  .  No changes today

## 2018-10-16 ENCOUNTER — Other Ambulatory Visit (INDEPENDENT_AMBULATORY_CARE_PROVIDER_SITE_OTHER): Payer: PPO

## 2018-10-16 ENCOUNTER — Other Ambulatory Visit: Payer: Self-pay

## 2018-10-16 DIAGNOSIS — E785 Hyperlipidemia, unspecified: Secondary | ICD-10-CM

## 2018-10-16 DIAGNOSIS — E119 Type 2 diabetes mellitus without complications: Secondary | ICD-10-CM | POA: Diagnosis not present

## 2018-10-16 DIAGNOSIS — E034 Atrophy of thyroid (acquired): Secondary | ICD-10-CM | POA: Diagnosis not present

## 2018-10-16 LAB — COMPREHENSIVE METABOLIC PANEL
ALT: 26 U/L (ref 0–53)
AST: 25 U/L (ref 0–37)
Albumin: 4.4 g/dL (ref 3.5–5.2)
Alkaline Phosphatase: 75 U/L (ref 39–117)
BUN: 19 mg/dL (ref 6–23)
CO2: 28 mEq/L (ref 19–32)
Calcium: 9.2 mg/dL (ref 8.4–10.5)
Chloride: 102 mEq/L (ref 96–112)
Creatinine, Ser: 1.1 mg/dL (ref 0.40–1.50)
GFR: 65.16 mL/min (ref >60.00)
Glucose, Bld: 140 mg/dL — ABNORMAL HIGH (ref 70–99)
Potassium: 4.5 mEq/L (ref 3.5–5.1)
Sodium: 138 mEq/L (ref 135–145)
Total Bilirubin: 0.8 mg/dL (ref 0.2–1.2)
Total Protein: 7.5 g/dL (ref 6.0–8.3)

## 2018-10-16 LAB — LIPID PANEL
Cholesterol: 161 mg/dL (ref 0–200)
HDL: 53.8 mg/dL (ref >39.00)
LDL Cholesterol: 90 mg/dL (ref 0–99)
NonHDL: 107.6
Total CHOL/HDL Ratio: 3
Triglycerides: 90 mg/dL (ref 0.0–149.0)
VLDL: 18 mg/dL (ref 0.0–40.0)

## 2018-10-16 LAB — HEMOGLOBIN A1C: Hgb A1c MFr Bld: 7 % — ABNORMAL HIGH (ref 4.6–6.5)

## 2018-10-16 LAB — TSH: TSH: 2.19 u[IU]/mL (ref 0.35–4.50)

## 2018-10-24 ENCOUNTER — Other Ambulatory Visit: Payer: Self-pay | Admitting: Internal Medicine

## 2018-11-21 ENCOUNTER — Other Ambulatory Visit: Payer: Self-pay | Admitting: Internal Medicine

## 2018-11-21 DIAGNOSIS — E034 Atrophy of thyroid (acquired): Secondary | ICD-10-CM

## 2018-12-16 DIAGNOSIS — H02832 Dermatochalasis of right lower eyelid: Secondary | ICD-10-CM | POA: Diagnosis not present

## 2018-12-16 DIAGNOSIS — H02831 Dermatochalasis of right upper eyelid: Secondary | ICD-10-CM | POA: Diagnosis not present

## 2018-12-16 DIAGNOSIS — Z01818 Encounter for other preprocedural examination: Secondary | ICD-10-CM | POA: Diagnosis not present

## 2018-12-16 DIAGNOSIS — H02835 Dermatochalasis of left lower eyelid: Secondary | ICD-10-CM | POA: Diagnosis not present

## 2018-12-16 DIAGNOSIS — H02834 Dermatochalasis of left upper eyelid: Secondary | ICD-10-CM | POA: Diagnosis not present

## 2018-12-16 DIAGNOSIS — Z1159 Encounter for screening for other viral diseases: Secondary | ICD-10-CM | POA: Diagnosis not present

## 2018-12-18 ENCOUNTER — Other Ambulatory Visit: Payer: Self-pay | Admitting: Internal Medicine

## 2018-12-19 DIAGNOSIS — H04223 Epiphora due to insufficient drainage, bilateral lacrimal glands: Secondary | ICD-10-CM | POA: Diagnosis not present

## 2018-12-19 DIAGNOSIS — Z7982 Long term (current) use of aspirin: Secondary | ICD-10-CM | POA: Diagnosis not present

## 2018-12-19 DIAGNOSIS — I10 Essential (primary) hypertension: Secondary | ICD-10-CM | POA: Diagnosis not present

## 2018-12-19 DIAGNOSIS — I2510 Atherosclerotic heart disease of native coronary artery without angina pectoris: Secondary | ICD-10-CM | POA: Diagnosis not present

## 2018-12-19 DIAGNOSIS — Z79899 Other long term (current) drug therapy: Secondary | ICD-10-CM | POA: Diagnosis not present

## 2018-12-19 DIAGNOSIS — E119 Type 2 diabetes mellitus without complications: Secondary | ICD-10-CM | POA: Diagnosis not present

## 2018-12-19 DIAGNOSIS — H02112 Cicatricial ectropion of right lower eyelid: Secondary | ICD-10-CM | POA: Diagnosis not present

## 2018-12-19 DIAGNOSIS — E039 Hypothyroidism, unspecified: Secondary | ICD-10-CM | POA: Diagnosis not present

## 2018-12-19 DIAGNOSIS — Z7984 Long term (current) use of oral hypoglycemic drugs: Secondary | ICD-10-CM | POA: Diagnosis not present

## 2018-12-19 DIAGNOSIS — H02132 Senile ectropion of right lower eyelid: Secondary | ICD-10-CM | POA: Diagnosis not present

## 2018-12-19 DIAGNOSIS — H02106 Unspecified ectropion of left eye, unspecified eyelid: Secondary | ICD-10-CM | POA: Diagnosis not present

## 2018-12-19 DIAGNOSIS — H02135 Senile ectropion of left lower eyelid: Secondary | ICD-10-CM | POA: Diagnosis not present

## 2018-12-19 DIAGNOSIS — H02103 Unspecified ectropion of right eye, unspecified eyelid: Secondary | ICD-10-CM | POA: Diagnosis not present

## 2018-12-19 DIAGNOSIS — H02115 Cicatricial ectropion of left lower eyelid: Secondary | ICD-10-CM | POA: Diagnosis not present

## 2018-12-31 ENCOUNTER — Other Ambulatory Visit: Payer: Self-pay | Admitting: Internal Medicine

## 2019-01-18 ENCOUNTER — Other Ambulatory Visit: Payer: Self-pay | Admitting: Internal Medicine

## 2019-02-23 ENCOUNTER — Other Ambulatory Visit: Payer: Self-pay | Admitting: Internal Medicine

## 2019-03-15 ENCOUNTER — Other Ambulatory Visit: Payer: Self-pay | Admitting: Internal Medicine

## 2019-03-22 ENCOUNTER — Other Ambulatory Visit: Payer: Self-pay

## 2019-03-22 ENCOUNTER — Ambulatory Visit (INDEPENDENT_AMBULATORY_CARE_PROVIDER_SITE_OTHER): Payer: PPO

## 2019-03-22 ENCOUNTER — Encounter: Payer: Self-pay | Admitting: Family Medicine

## 2019-03-22 ENCOUNTER — Ambulatory Visit (INDEPENDENT_AMBULATORY_CARE_PROVIDER_SITE_OTHER): Payer: PPO | Admitting: Family Medicine

## 2019-03-22 VITALS — BP 118/60 | HR 55 | Temp 96.8°F | Ht 71.0 in | Wt 189.8 lb

## 2019-03-22 DIAGNOSIS — M25512 Pain in left shoulder: Secondary | ICD-10-CM | POA: Diagnosis not present

## 2019-03-22 DIAGNOSIS — M19012 Primary osteoarthritis, left shoulder: Secondary | ICD-10-CM | POA: Diagnosis not present

## 2019-03-22 DIAGNOSIS — G8929 Other chronic pain: Secondary | ICD-10-CM

## 2019-03-22 DIAGNOSIS — B356 Tinea cruris: Secondary | ICD-10-CM | POA: Diagnosis not present

## 2019-03-22 NOTE — Progress Notes (Signed)
Subjective:    Patient ID: Brett [name] Seen, male    DOB: June 16, 1940, 78 y.o.   MRN: [id number]  HPI   Patient presents to clinic due to left shoulder pain for 3 months.  States it began after throwing a ball for many hours with his grandsons and and also trimming hedges.  Ever since he has had pain in left shoulder and difficulty raising left arm fully above head without pain.  He is left-hand dominant.  No numbness or tingling in fingers.  Patient also reports some itching in jock area and some dryness of skin at times.  Pharmacy suggested a over-the-counter Lotrimin cream, but this has not really done much to help.  He does not take any sort of allergy medication and does not regularly use any sort of body powder.    Patient Active Problem List   Diagnosis Date Noted  . Bradycardia with 41-50 beats per minute 04/04/2018  . Hypersomnia 04/04/2018  . Change in voice 11/06/2016  . Insomnia 06/08/2015  . Benign prostatic hyperplasia with nocturia 12/22/2014  . CAD (coronary artery disease) 10/15/2013  . Depression with anxiety 08/07/2012  . Pulmonary nodule seen on imaging study   . H/O renal calculi   . Hyperlipidemia LDL goal <70 01/04/2012  . Routine general medical examination at a health care facility 01/04/2012  . Diabetes mellitus without complication ([place]) [id number]  . Hypothyroidism 07/09/2011  . Hypertension 07/08/2011   Social History   Tobacco Use  . Smoking status: Former Smoker    Types: Cigars    Quit date: 10/04/1981    Years since quitting: 37.4  . Smokeless tobacco: Never Used  Substance Use Topics  . Alcohol use: Yes    Alcohol/week: 1.0 standard drinks    Types: 1 Cans of beer per week    Comment: everyday    Review of Systems  Constitutional: Negative for chills, fatigue and fever.  HENT: Negative for congestion, ear pain, sinus pain and sore throat.   Eyes: Negative.   Respiratory: Negative for cough, shortness of breath and wheezing.    Cardiovascular: Negative for chest pain, palpitations and leg swelling.  Gastrointestinal: Negative for abdominal pain, diarrhea, nausea and vomiting.  Genitourinary: Negative for dysuria, frequency and urgency.  Musculoskeletal: +left shoulder pain Skin: Negative for color change, pallor. +itch and rash a times in groin area  Neurological: Negative for syncope, light-headedness and headaches.  Psychiatric/Behavioral: The patient is not nervous/anxious.    Objective:   Physical Exam Vitals signs and nursing note reviewed.  Constitutional:      General: He is not in acute distress.    Appearance: He is not ill-appearing, toxic-appearing or diaphoretic.  HENT:     Head: Normocephalic and atraumatic.  Neck:     Musculoskeletal: Normal range of motion and neck supple. No neck rigidity.  Cardiovascular:     Rate and Rhythm: Normal rate and regular rhythm.  Pulmonary:     Effort: Pulmonary effort is normal. No respiratory distress.     Breath sounds: Normal breath sounds.  Musculoskeletal:     Left shoulder: He exhibits decreased range of motion and tenderness.     Right lower leg: No edema.     Left lower leg: No edema.     Comments: Tenderness to palpation of left AC joint.  Is able to lift arm straight up above head, but does have some pain doing this.  Grips equal and strong.  Pain when asked to hold arms straight up  front of him and resist me pushing down in left shoulder as well.  Skin:    General: Skin is warm and dry.     Coloration: Skin is not jaundiced or pale.     Comments: Patient declines looking at his groin/back area to examine the skin.  Neurological:     Mental Status: He is alert and oriented to person, place, and time.  Psychiatric:        Mood and Affect: Mood normal.        Behavior: Behavior normal.    Today's Vitals   03/22/19 0822  BP: 118/60  Pulse: (!) 55  Temp: (!) 96.8 F (36 C)  TempSrc: Temporal  SpO2: 97%  Weight: 189 lb 12.8 oz (86.1 kg)   Height: 5\' 11"  (1.803 m)  PainSc: 6   PainLoc: Shoulder   Body mass index is 26.47 kg/m.     Assessment & Plan:    Left shoulder pain -- we will get x-ray in clinic.  Discussed gentle range of motion exercises and using Tylenol or ibuprofen as needed for pain relief.  Advised to avoid ibuprofen any excess.  Also discussed topical rubs like BenGay, Biofreeze to help reduce pain as well as a heating pad.  Referral to sports medicine placed as well.  Jock itch - patient will use over-the-counter Goldbond powder to see if this helps reduce sensation of jock itch.  Also discussed trying a antihistamine to calm down bodies itch response.  Patient will keep all regular follow-ups with PCP as planned and return to clinic sooner if any issues arise.

## 2019-03-22 NOTE — Patient Instructions (Signed)
Over the counter antihistamine such as Claritin or Zyrtec can be helpful in reducing itching sensation.  Goldbond is a great powder product that can help jock itch/itching.  The one for body use is in a yellow bottle with a red top

## 2019-03-23 ENCOUNTER — Telehealth: Payer: Self-pay

## 2019-03-23 NOTE — Telephone Encounter (Signed)
Copied from [place] [id number]. Topic: Quick Communication - Other Results (Clinic Use ONLY) >> Mar 23, 2019 12:51 PM Lennox Solders wrote: Pt is calling and would like shoulder xray results

## 2019-03-26 ENCOUNTER — Telehealth: Payer: Self-pay

## 2019-03-26 ENCOUNTER — Other Ambulatory Visit: Payer: Self-pay | Admitting: Internal Medicine

## 2019-03-26 NOTE — Telephone Encounter (Signed)
Patient was informed of results.  Patient understood and no questions, comments, or concerns at this time.  

## 2019-03-26 NOTE — Telephone Encounter (Signed)
Copied from [place] [id number]. Topic: Quick Communication - Other Results (Clinic Use ONLY) >> Mar 23, 2019 12:51 PM Lennox Solders wrote: Pt is calling and would like shoulder xray results

## 2019-03-26 NOTE — Telephone Encounter (Signed)
Copied from [place] [id number]. Topic: Quick Communication - Other Results (Clinic Use ONLY) >> Mar 23, 2019 12:51 PM Lennox Solders wrote: Pt is calling and would like shoulder xray results

## 2019-03-26 NOTE — Telephone Encounter (Signed)
Please  respond to patient re x rays you ordered

## 2019-03-26 NOTE — Telephone Encounter (Signed)
Resulted on 10/16  Notes recorded by Jodelle Green, FNP on 03/23/2019 at 12:32 PM EDT  Your shoulder x-ray does show mild degenerative changes without any acute or new abnormality. I think our plan with seeing sports medicine will be helpful to reduce pain and help improve range of motion in the shoulder joint.   Sincerely,  Philis Nettle FNP

## 2019-03-26 NOTE — Telephone Encounter (Signed)
Copied from [place] [id number]. Topic: General - Inquiry >> Mar 26, 2019 11:12 AM Alease Frame wrote: Reason for CRM: Patient call to get results for xrays that were done [id number] Please advise

## 2019-03-27 ENCOUNTER — Telehealth: Payer: Self-pay | Admitting: *Deleted

## 2019-03-27 NOTE — Telephone Encounter (Signed)
Copied from [place] [id number]. Topic: General - Other >> Mar 27, 2019  3:50 PM Wynetta Emery, [place] C wrote: Reason for CRM: pt says that he has an appt with Ortho on Monday, pt was told to bring a copy of the imaging to his visit. Pt would like to pick up a copy from the office.   Please assist.   CB: [phone]

## 2019-03-27 NOTE — Telephone Encounter (Signed)
See previous message from [place], [place]

## 2019-03-28 NOTE — Telephone Encounter (Signed)
Spoke with pt to let him know that we can get him a copy of his xray of shoulder on a disk. Pt was told that it would be ready for pick up in about an hour and half.

## 2019-03-28 NOTE — Telephone Encounter (Signed)
CD & report has been placed up front for pickup.

## 2019-03-29 ENCOUNTER — Other Ambulatory Visit: Payer: Self-pay | Admitting: Internal Medicine

## 2019-03-29 DIAGNOSIS — E034 Atrophy of thyroid (acquired): Secondary | ICD-10-CM

## 2019-04-02 DIAGNOSIS — M7542 Impingement syndrome of left shoulder: Secondary | ICD-10-CM | POA: Diagnosis not present

## 2019-04-02 DIAGNOSIS — D2272 Melanocytic nevi of left lower limb, including hip: Secondary | ICD-10-CM | POA: Diagnosis not present

## 2019-04-02 DIAGNOSIS — D225 Melanocytic nevi of trunk: Secondary | ICD-10-CM | POA: Diagnosis not present

## 2019-04-02 DIAGNOSIS — L821 Other seborrheic keratosis: Secondary | ICD-10-CM | POA: Diagnosis not present

## 2019-04-02 DIAGNOSIS — X32XXXA Exposure to sunlight, initial encounter: Secondary | ICD-10-CM | POA: Diagnosis not present

## 2019-04-02 DIAGNOSIS — L820 Inflamed seborrheic keratosis: Secondary | ICD-10-CM | POA: Diagnosis not present

## 2019-04-02 DIAGNOSIS — D2262 Melanocytic nevi of left upper limb, including shoulder: Secondary | ICD-10-CM | POA: Diagnosis not present

## 2019-04-02 DIAGNOSIS — D2261 Melanocytic nevi of right upper limb, including shoulder: Secondary | ICD-10-CM | POA: Diagnosis not present

## 2019-04-02 DIAGNOSIS — D2271 Melanocytic nevi of right lower limb, including hip: Secondary | ICD-10-CM | POA: Diagnosis not present

## 2019-04-02 DIAGNOSIS — L538 Other specified erythematous conditions: Secondary | ICD-10-CM | POA: Diagnosis not present

## 2019-04-02 DIAGNOSIS — D485 Neoplasm of uncertain behavior of skin: Secondary | ICD-10-CM | POA: Diagnosis not present

## 2019-04-02 DIAGNOSIS — L570 Actinic keratosis: Secondary | ICD-10-CM | POA: Diagnosis not present

## 2019-04-05 DIAGNOSIS — L570 Actinic keratosis: Secondary | ICD-10-CM | POA: Diagnosis not present

## 2019-04-28 ENCOUNTER — Other Ambulatory Visit: Payer: Self-pay | Admitting: Internal Medicine

## 2019-05-22 ENCOUNTER — Ambulatory Visit (INDEPENDENT_AMBULATORY_CARE_PROVIDER_SITE_OTHER): Payer: PPO

## 2019-05-22 ENCOUNTER — Other Ambulatory Visit: Payer: Self-pay

## 2019-05-22 DIAGNOSIS — Z0000 Encounter for general adult medical examination without abnormal findings: Secondary | ICD-10-CM | POA: Diagnosis not present

## 2019-05-22 NOTE — Progress Notes (Signed)
Subjective:   Brett [name] is a 78 y.o. male who presents for Medicare Annual/Subsequent preventive examination.  Review of Systems:  No ROS.  Medicare Wellness Virtual Visit.  Visual/audio telehealth visit, UTA vital signs.   See social history for additional risk factors.   Cardiac Risk Factors include: advanced age (>6mn, >>88women);male gender;hypertension;diabetes mellitus     Objective:    Vitals: There were no vitals taken for this visit.  There is no height or weight on file to calculate BMI.  Advanced Directives 05/22/2019 05/17/2018 05/11/2017 01/24/2017 05/11/2016 05/07/2015  Does Patient Have a Medical Advance Directive? Yes Yes Yes No Yes Yes  Type of AParamedicof AOnalaskaLiving will HLomaLiving will H[place]baLiving will - HHanoverLiving will HKountzeLiving will  Does patient want to make changes to medical advance directive? No - Patient declined No - Patient declined No - Patient declined - - -  Copy of HHavre de Gracein Chart? No - copy requested No - copy requested No - copy requested - No - copy requested No - copy requested    Tobacco Social History   Tobacco Use  Smoking Status Former Smoker  . Types: Cigars  . Quit date: 10/04/1981  . Years since quitting: 37.6  Smokeless Tobacco Never Used     Counseling given: Not Answered   Clinical Intake:  Pre-visit preparation completed: Yes        Diabetes: Yes(Followed by pcp)  How often do you need to have someone help you when you read instructions, pamphlets, or other written materials from your doctor or pharmacy?: 1 - Never  Interpreter Needed?: No     Past Medical History:  Diagnosis Date  . Allergy    resolved after CABG  . Calcific Achilles tendonitis   . Calculus of ureter   . Coronary artery disease   . Diabetes mellitus   . H/O renal calculi 2012  . Hematuria,  unspecified   . Hyperlipidemia   . Hypertension   . Impotence of organic origin   . [place]   . Peyronie's disease   . Pulmonary nodule seen on imaging study 2012  . Sleep difficulties   . Swelling, mass, or lump in chest   . Unspecified hypothyroidism    Past Surgical History:  Procedure Laterality Date  . CARDIAC CATHETERIZATION  2001   [facility] x3 stent  . COLONOSCOPY    . COLONOSCOPY WITH PROPOFOL N/A 01/24/2017   Procedure: COLONOSCOPY WITH PROPOFOL;  Surgeon: EManya Silvas MD;  [facility];  Service: Endoscopy;  Laterality: N/A;  . CORONARY ARTERY BYPASS GRAFT  2006   4 vessel, after 3 or 4 stents   . CORONARY ARTERY BYPASS GRAFT    . OSTECTOMY CALCANEUS    . PENILE PROSTHESIS IMPLANT     Family History  Problem Relation Age of Onset  . Hyperlipidemia Mother   . AAA (abdominal aortic aneurysm) Mother   . Heart disease Father 758  Social History   Socioeconomic History  . Marital status: Married    Spouse name: MAudelia Actonnow deceased  . Number of children: Not on file  . Years of education: Not on file  . Highest education level: Not on file  Occupational History    Employer: retired  Tobacco Use  . Smoking status: Former Smoker    Types: Cigars    Quit date: 10/04/1981  Years since quitting: 37.6  . Smokeless tobacco: Never Used  Substance and Sexual Activity  . Alcohol use: Yes    Alcohol/week: 1.0 standard drinks    Types: 1 Cans of beer per week    Comment: everyday  . Drug use: No  . Sexual activity: Not Currently  Other Topics Concern  . Not on file  Social History Narrative  . Not on file   Social Determinants of Health   Financial Resource Strain:   . Difficulty of Paying Living Expenses: Not on file  Food Insecurity:   . Worried About Charity fundraiser in the Last Year: Not on file  . Ran Out of Food in the Last Year: Not on file  Transportation Needs:   . Lack of Transportation (Medical):  Not on file  . Lack of Transportation (Non-Medical): Not on file  Physical Activity:   . Days of Exercise per Week: Not on file  . Minutes of Exercise per Session: Not on file  Stress:   . Feeling of Stress : Not on file  Social Connections:   . Frequency of Communication with Friends and Family: Not on file  . Frequency of Social Gatherings with Friends and Family: Not on file  . Attends Religious Services: Not on file  . Active Member of Clubs or Organizations: Not on file  . Attends Archivist Meetings: Not on file  . Marital Status: Not on file    Outpatient Encounter Medications as of 05/22/2019  Medication Sig  . atorvastatin (LIPITOR) 80 MG tablet TAKE 1 TABLET BY MOUTH EVERY DAY  . DULoxetine (CYMBALTA) 30 MG capsule TAKE 1 CAPSULE BY MOUTH EVERY DAY  . glipiZIDE (GLUCOTROL) 5 MG tablet TAKE 1 TABLET (5 MG TOTAL) BY MOUTH DAILY BEFORE BREAKFAST.  Marland Kitchen glucose blood (FREESTYLE LITE) test strip CHECK BLOOD SUGAR DAILY AS DIRECTED  . glucose monitoring kit (FREESTYLE) monitoring kit 1 each by Does not apply route as needed for other. For use daily to monitor diabetes.  Please include lancets .  Test once daily   E11.9  . Lancets (FREESTYLE) lancets Use as instructed  . levothyroxine (SYNTHROID) 112 MCG tablet TAKE 1 TABLET BY MOUTH EVERY DAY  . tamsulosin (FLOMAX) 0.4 MG CAPS capsule TAKE 1 CAPSULE (0.4 MG TOTAL) DAILY BY MOUTH.  Marland Kitchen telmisartan (MICARDIS) 40 MG tablet TAKE 1/2 TABLET BY MOUTH ONCE A DAY   No facility-administered encounter medications on file as of 05/22/2019.    Activities of Daily Living In your present state of health, do you have any difficulty performing the following activities: 05/22/2019  Hearing? N  Vision? N  Difficulty concentrating or making decisions? N  Walking or climbing stairs? N  Dressing or bathing? N  Doing errands, shopping? N  Preparing Food and eating ? N  Using the Toilet? N  In the past six months, have you accidently  leaked urine? N  Do you have problems with loss of bowel control? N  Managing your Medications? N  Managing your Finances? N  Housekeeping or managing your Housekeeping? N  Some recent data might be hidden    Patient Care Team: Crecencio Mc, MD as PCP - General (Internal Medicine) Minna Merritts, MD as Consulting Physician (Cardiology)   Assessment:   This is a routine wellness examination for [place]. Nurse connected with patient 05/22/19 at  9:00 AM EST by a telephone enabled telemedicine application and verified that I am speaking with the correct person using two  identifiers. Patient stated full name and DOB. Patient gave permission to continue with virtual visit. Patient's location was at home and Nurse's location was at [place] office.   Patient is alert and oriented x3. Patient denies difficulty focusing or concentrating. Patient likes to read, tell jokes and completes crossword puzzles daily for brain stimulation.   Health Maintenance Due: -Eye Exam- due 03/2019. Plans to schedule with [facility]; reports intermittent headache with eye movement x2 weeks.  -Foot Exam- followed by pcp -Hgb A1c- due; 10/16/18 (7.0). Monitors glucose twice weekly. See completed HM at the end of note.   Eye: Visual acuity not assessed. Virtual visit. Followed by their ophthalmologist. Retinopathy- none reported. Wears glasses when reading.   Dental: UTD  Hearing: Demonstrates normal hearing during visit.  Safety:  Patient feels safe at home- yes Patient does have smoke detectors at home- yes Patient does wear sunscreen or protective clothing when in direct sunlight - yes Patient does wear seat belt when in a moving vehicle - yes Patient drives- yes Adequate lighting in walkways free from debris- yes Grab bars and handrails used as appropriate- yes Ambulates with an assistive device- no Cell phone on person when ambulating outside of the home- yes  Social: Alcohol intake -  yes      Smoking history- former   Smokers in home? none Illicit drug use? none  Medication: Taking as directed and without issues.  Pill box in use -yes  Self managed - yes   Covid-19: Precautions and sickness symptoms discussed. Wears mask, social distancing, hand hygiene as appropriate.   Activities of Daily Living Patient denies needing assistance with: household chores, feeding themselves, getting from bed to chair, getting to the toilet, bathing/showering, dressing, managing money, or preparing meals.   Discussed the importance of a healthy diet, water intake and the benefits of aerobic exercise.   Physical activity- walking 45 minutes daily  Diet:  Low carb  Water: good intake Caffeine: 2 cups of coffee   Other Providers Patient Care Team: Crecencio Mc, MD as PCP - General (Internal Medicine) Minna Merritts, MD as Consulting Physician (Cardiology)  Exercise Activities and Dietary recommendations Current Exercise Habits: Home exercise routine, Type of exercise: walking, Time (Minutes): 45, Frequency (Times/Week): 7, Weekly Exercise (Minutes/Week): 315, Intensity: Moderate  Goals      Patient Stated   . Obtain Annual Eye (retinal)  Exam  (pt-stated)       Fall Risk Fall Risk  05/22/2019 03/22/2019 05/17/2018 05/11/2017 05/06/2016  Falls in the past year? 0 0 0 No No  Number falls in past yr: - 0 - - -  Follow up Education provided Falls evaluation completed - - -   Timed Get Up and Go Performed: no, virtual visit  Depression Screen PHQ 2/9 Scores 05/22/2019 03/22/2019 05/17/2018 05/11/2017  PHQ - 2 Score 0 0 0 3  PHQ- 9 Score - - - 5    Cognitive Function MMSE - Mini Mental State Exam 05/11/2017 05/07/2015  Orientation to time 5 5  Orientation to Place 5 5  Registration 3 3  Attention/ Calculation 5 5  Recall 3 3  Language- name 2 objects 2 2  Language- repeat 1 1  Language- follow 3 step command 3 3  Language- read & follow direction 1 1  Write  a sentence 1 1  Copy design 1 1  Total score 30 30     6CIT Screen 05/22/2019 05/17/2018 05/11/2016  What Year? 0 points 0 points  0 points  What month? 0 points 0 points 0 points  What time? 0 points 0 points 0 points  Count back from 20 0 points 0 points 0 points  Months in reverse 0 points 0 points 0 points  Repeat phrase 0 points - 0 points  Total Score 0 - 0    Immunization History  Administered Date(s) Administered  . Influenza Split 03/16/2013, 02/11/2015  . Influenza Whole 02/14/2012  . Influenza-Unspecified 02/05/2013, 02/11/2015, 02/27/2016, 02/20/2018  . Pneumococcal Conjugate-13 03/16/2013  . Pneumococcal Polysaccharide-23 07/08/2009, 06/25/2015  . Tdap 03/16/2013  . Zoster Recombinat (Shingrix) 04/06/2018, 08/29/2018   Screening Tests Health Maintenance  Topic Date Due  . OPHTHALMOLOGY EXAM  03/10/2019  . FOOT EXAM  04/04/2019  . HEMOGLOBIN A1C  04/18/2019  . COLONOSCOPY  01/24/2022  . TETANUS/TDAP  03/17/2023  . INFLUENZA VACCINE  Completed  . PNA vac Low Risk Adult  Completed         Plan:   Keep all routine maintenance appointments.   Follow up with your doctor 05/23/19 @ 930.   Medicare Attestation I have personally reviewed: The patient's medical and social history Their use of alcohol, tobacco or illicit drugs Their current medications and supplements The patient's functional ability including ADLs,fall risks, home safety risks, cognitive, and hearing and visual impairment Diet and physical activities Evidence for depression   In addition, I have reviewed and discussed with patient certain preventive protocols, quality metrics, and best practice recommendations.   Varney Biles, LPN  [id number]

## 2019-05-22 NOTE — Patient Instructions (Addendum)
  Mr. Speirs , Thank you for taking time to come for your Medicare Wellness Visit. I appreciate your ongoing commitment to your health goals. Please review the following plan we discussed and let me know if I can assist you in the future.   These are the goals we discussed: Goals      Patient Stated   . Obtain Annual Eye (retinal)  Exam  (pt-stated)       This is a list of the screening recommended for you and due dates:  Health Maintenance  Topic Date Due  . Eye exam for diabetics  03/10/2019  . Complete foot exam   04/04/2019  . Hemoglobin A1C  04/18/2019  . Colon Cancer Screening  01/24/2022  . Tetanus Vaccine  03/17/2023  . Flu Shot  Completed  . Pneumonia vaccines  Completed

## 2019-05-23 ENCOUNTER — Ambulatory Visit: Payer: PPO

## 2019-05-23 ENCOUNTER — Encounter: Payer: Self-pay | Admitting: Internal Medicine

## 2019-05-23 ENCOUNTER — Ambulatory Visit (INDEPENDENT_AMBULATORY_CARE_PROVIDER_SITE_OTHER): Payer: PPO | Admitting: Internal Medicine

## 2019-05-23 ENCOUNTER — Other Ambulatory Visit: Payer: Self-pay

## 2019-05-23 VITALS — BP 124/80 | Ht 71.0 in | Wt 185.0 lb

## 2019-05-23 DIAGNOSIS — E039 Hypothyroidism, unspecified: Secondary | ICD-10-CM | POA: Diagnosis not present

## 2019-05-23 DIAGNOSIS — E119 Type 2 diabetes mellitus without complications: Secondary | ICD-10-CM

## 2019-05-23 DIAGNOSIS — I25708 Atherosclerosis of coronary artery bypass graft(s), unspecified, with other forms of angina pectoris: Secondary | ICD-10-CM | POA: Diagnosis not present

## 2019-05-23 NOTE — Assessment & Plan Note (Signed)
Historically  well-controlled on current medications .  hemoglobin A1c is due and has been at goal of equal to or less than 7.0 . Patient has scheduled an annual eye exam and foot exam is reportedly normal . Patient has no microalbuminuria. Patient has tolerated statin therapy for CAD risk reduction  and is taking an ACE/ARB for renal protection and hypertension   Lab Results  Component Value Date   HGBA1C 7.0 (H) 10/16/2018   Lab Results  Component Value Date   MICROALBUR 1.3 04/03/2018   Lab Results  Component Value Date   CHOL 161 10/16/2018   HDL 53.80 10/16/2018   LDLCALC 90 10/16/2018   LDLDIRECT 193.0 05/18/2017   TRIG 90.0 10/16/2018   CHOLHDL 3 10/16/2018

## 2019-05-23 NOTE — Assessment & Plan Note (Signed)
Thyroid function has been WNL on current dose.  No current changes needed.   Lab Results  Component Value Date   TSH 2.19 10/16/2018

## 2019-05-23 NOTE — Progress Notes (Signed)
Telephone Visit   This visit type was conducted due to national recommendations for restrictions regarding the COVID-19 pandemic (e.g. social distancing).  This format is felt to be most appropriate for this patient at this time.  All issues noted in this document were discussed and addressed.  No physical exam was performed (except for noted visual exam findings with Video Visits).   I connected with@ on 05/23/19 at  9:30 AM EST by  Telephone  and verified that I am speaking with the correct person using two identifiers. Location patient: home Location provider: work or home office Persons participating in the virtual visit: patient, provider  I discussed the limitations, risks, security and privacy concerns of performing an evaluation and management service by telephone and the availability of in person appointments. I also discussed with the patient that there may be a patient responsible charge related to this service. The patient expressed undremarried,  Has erstanding and agreed to proceed.  Reason for visit: follow up on type 2 DM, hypertension and hyperlipidemia   HPI:   78 yr old male with above history last seen in may for follow up,  No labs since then .  The patient has no signs or symptoms of COVID 19 infection (fever, cough, sore throat  or shortness of breath beyond what is typical for patient).  Patient denies contact with other persons with the above mentioned symptoms or with anyone confirmed to have COVID 19   Expresses anxiety,  But very happy 6 years remarried .  Has lots of grandchildren   BP at home 120/80   6 month follow up on diabetes.  Patient has no complaints today.  Patient is following a low glycemic index diet and taking all prescribed medications regularly without side effects.  Checks BS twice weekly. Fasting sugars have been under less than 140 most of the time and post prandials have not  Been checked except on rare occasions. Patient is exercising about 4  times per week but has gained 2 or 3 lbs .  Patient is scheduled for an eye  Exam this week and notes no vision changes but has discomfort intermittently when he moves his eyes.  checks feet regularly. Patient does not walk barefoot outside,  And denies an numbness tingling or burning in feet. Patient is up to date on all recommended vaccinations    Having some eye pain with movement , intermittent . Denies headaches , vision changes . No temple tenderness.  Weight gain to 185 . Walking 45 minutes every other day   Hypertension: patient checks blood pressure twice weekly at home.  Readings have been for the most part <130/80 at rest . Patient is following a reduce salt diet most days and is taking medications as prescribed    Past Medical History:  Diagnosis Date  . Allergy    resolved after CABG  . Calcific Achilles tendonitis   . Calculus of ureter   . Coronary artery disease   . Diabetes mellitus   . H/O renal calculi 2012  . Hematuria, unspecified   . Hyperlipidemia   . Hypertension   . Impotence of organic origin   . Myocardial infarction ([place])   . Peyronie's disease   . Pulmonary nodule seen on imaging study 2012  . Sleep difficulties   . Swelling, mass, or lump in chest   . Unspecified hypothyroidism     Past Surgical History:  Procedure Laterality Date  . CARDIAC CATHETERIZATION  2001   [facility] x3 stent  . COLONOSCOPY    . COLONOSCOPY WITH PROPOFOL N/A 01/24/2017   Procedure: COLONOSCOPY WITH PROPOFOL;  Surgeon: Manya Silvas, MD;  [facility];  Service: Endoscopy;  Laterality: N/A;  . CORONARY ARTERY BYPASS GRAFT  2006   4 vessel, after 3 or 4 stents   . CORONARY ARTERY BYPASS GRAFT    . OSTECTOMY CALCANEUS    . PENILE PROSTHESIS IMPLANT      Family History  Problem Relation Age of Onset  . Hyperlipidemia Mother   . AAA (abdominal aortic aneurysm) Mother   . Heart disease Father 32    SOCIAL HX:  reports that he quit smoking  about 37 years ago. His smoking use included cigars. He has never used smokeless tobacco. He reports current alcohol use of about 1.0 standard drinks of alcohol per week. He reports that he does not use drugs.   Current Outpatient Medications:  .  atorvastatin (LIPITOR) 80 MG tablet, TAKE 1 TABLET BY MOUTH EVERY DAY, Disp: 90 tablet, Rfl: 1 .  DULoxetine (CYMBALTA) 30 MG capsule, TAKE 1 CAPSULE BY MOUTH EVERY DAY, Disp: 90 capsule, Rfl: 1 .  glipiZIDE (GLUCOTROL) 5 MG tablet, TAKE 1 TABLET (5 MG TOTAL) BY MOUTH DAILY BEFORE BREAKFAST., Disp: 90 tablet, Rfl: 0 .  glucose blood (FREESTYLE LITE) test strip, CHECK BLOOD SUGAR DAILY AS DIRECTED, Disp: 100 each, Rfl: 5 .  glucose monitoring kit (FREESTYLE) monitoring kit, 1 each by Does not apply route as needed for other. For use daily to monitor diabetes.  Please include lancets .  Test once daily   E11.9, Disp: 1 each, Rfl: 3 .  Lancets (FREESTYLE) lancets, Use as instructed, Disp: 100 each, Rfl: 12 .  levothyroxine (SYNTHROID) 112 MCG tablet, TAKE 1 TABLET BY MOUTH EVERY DAY, Disp: 90 tablet, Rfl: 1 .  tamsulosin (FLOMAX) 0.4 MG CAPS capsule, TAKE 1 CAPSULE (0.4 MG TOTAL) DAILY BY MOUTH., Disp: 90 capsule, Rfl: 1 .  telmisartan (MICARDIS) 40 MG tablet, TAKE 1/2 TABLET BY MOUTH ONCE A DAY, Disp: 45 tablet, Rfl: 2  EXAM:  BP 124/80   Ht 5' 11"  (1.803 m)   Wt 185 lb (83.9 kg)   BMI 25.80 kg/m  . General impression: alert, cooperative and articulate.  No signs of being in distress  Lungs: speech is fluent sentence length suggests that patient is not short of breath and not punctuated by cough, sneezing or sniffing. Marland Kitchen   Psych: affect normal.  speech is articulate and non pressured .  Denies suicidal thoughts    ASSESSMENT AND PLAN:  Discussed the following assessment and plan:  Acquired hypothyroidism - Plan: TSH  Diabetes mellitus without complication ([place]) - Plan: Comprehensive metabolic panel, Hemoglobin A1c, Lipid panel, Microalbumin /  creatinine urine ratio  Coronary artery disease of bypass graft of native heart with stable angina pectoris (HCC)  Hypothyroidism Thyroid function has been WNL on current dose.  No current changes needed.   Lab Results  Component Value Date   TSH 2.19 10/16/2018     CAD (coronary artery disease) S/p 3 vessel CABG in 2006. He is asymptomatic , and he has resumed statin given his history of CAD and recent findings of aortic atherosclerosis  Lab Results  Component Value Date   CHOL 161 10/16/2018   HDL 53.80 10/16/2018   LDLCALC 90 10/16/2018   LDLDIRECT 193.0 05/18/2017   TRIG 90.0 10/16/2018   CHOLHDL 3 10/16/2018     Diabetes mellitus without complication (  [place]) Historically  well-controlled on current medications .  hemoglobin A1c is due and has been at goal of equal to or less than 7.0 . Patient has scheduled an annual eye exam and foot exam is reportedly normal . Patient has no microalbuminuria. Patient has tolerated statin therapy for CAD risk reduction  and is taking an ACE/ARB for renal protection and hypertension   Lab Results  Component Value Date   HGBA1C 7.0 (H) 10/16/2018   Lab Results  Component Value Date   MICROALBUR 1.3 04/03/2018   Lab Results  Component Value Date   CHOL 161 10/16/2018   HDL 53.80 10/16/2018   LDLCALC 90 10/16/2018   LDLDIRECT 193.0 05/18/2017   TRIG 90.0 10/16/2018   CHOLHDL 3 10/16/2018       I discussed the assessment and treatment plan with the patient. The patient was provided an opportunity to ask questions and all were answered. The patient agreed with the plan and demonstrated an understanding of the instructions.   The patient was advised to call back or seek an in-person evaluation if the symptoms worsen or if the condition fails to improve as anticipated.  I provided  25 minutes of non-face-to-face time during this encounter reviewing patient's current problems and past procedures/imaging studies, providing counseling  on the above mentioned problems , and coordination  of care .  Crecencio Mc, MD

## 2019-05-23 NOTE — Assessment & Plan Note (Signed)
S/p 3 vessel CABG in 2006. He is asymptomatic , and he has resumed statin given his history of CAD and recent findings of aortic atherosclerosis  Lab Results  Component Value Date   CHOL 161 10/16/2018   HDL 53.80 10/16/2018   LDLCALC 90 10/16/2018   LDLDIRECT 193.0 05/18/2017   TRIG 90.0 10/16/2018   CHOLHDL 3 10/16/2018

## 2019-05-29 DIAGNOSIS — E119 Type 2 diabetes mellitus without complications: Secondary | ICD-10-CM | POA: Diagnosis not present

## 2019-05-29 LAB — HM DIABETES EYE EXAM

## 2019-06-15 ENCOUNTER — Other Ambulatory Visit: Payer: Self-pay

## 2019-06-15 ENCOUNTER — Encounter: Payer: Self-pay | Admitting: Internal Medicine

## 2019-06-15 ENCOUNTER — Other Ambulatory Visit (INDEPENDENT_AMBULATORY_CARE_PROVIDER_SITE_OTHER): Payer: PPO

## 2019-06-15 DIAGNOSIS — E039 Hypothyroidism, unspecified: Secondary | ICD-10-CM

## 2019-06-15 DIAGNOSIS — E119 Type 2 diabetes mellitus without complications: Secondary | ICD-10-CM | POA: Diagnosis not present

## 2019-06-15 DIAGNOSIS — E034 Atrophy of thyroid (acquired): Secondary | ICD-10-CM

## 2019-06-15 LAB — HEMOGLOBIN A1C: Hgb A1c MFr Bld: 7 % — ABNORMAL HIGH (ref 4.6–6.5)

## 2019-06-15 LAB — COMPREHENSIVE METABOLIC PANEL
ALT: 15 U/L (ref 0–53)
AST: 19 U/L (ref 0–37)
Albumin: 4.2 g/dL (ref 3.5–5.2)
Alkaline Phosphatase: 72 U/L (ref 39–117)
BUN: 18 mg/dL (ref 6–23)
CO2: 29 mEq/L (ref 19–32)
Calcium: 9.4 mg/dL (ref 8.4–10.5)
Chloride: 105 mEq/L (ref 96–112)
Creatinine, Ser: 1.08 mg/dL (ref 0.40–1.50)
GFR: 66.44 mL/min (ref >60.00)
Glucose, Bld: 124 mg/dL — ABNORMAL HIGH (ref 70–99)
Potassium: 4 mEq/L (ref 3.5–5.1)
Sodium: 141 mEq/L (ref 135–145)
Total Bilirubin: 0.6 mg/dL (ref 0.2–1.2)
Total Protein: 7.3 g/dL (ref 6.0–8.3)

## 2019-06-15 LAB — MICROALBUMIN / CREATININE URINE RATIO
Creatinine,U: 135.4 mg/dL
Microalb Creat Ratio: 0.7 mg/g (ref 0.0–30.0)
Microalb, Ur: 1 mg/dL (ref 0.0–1.9)

## 2019-06-15 LAB — LIPID PANEL
Cholesterol: 158 mg/dL (ref 0–200)
HDL: 48.2 mg/dL (ref >39.00)
LDL Cholesterol: 97 mg/dL (ref 0–99)
NonHDL: 109.8
Total CHOL/HDL Ratio: 3
Triglycerides: 62 mg/dL (ref 0.0–149.0)
VLDL: 12.4 mg/dL (ref 0.0–40.0)

## 2019-06-15 LAB — TSH: TSH: 6.63 u[IU]/mL — ABNORMAL HIGH (ref 0.35–4.50)

## 2019-06-17 MED ORDER — LEVOTHYROXINE SODIUM 125 MCG PO TABS: 125.0000 ug | ORAL_TABLET | Freq: Every day | ORAL | 1 refills | Status: DC

## 2019-06-17 NOTE — Addendum Note (Signed)
Addended by: Crecencio Mc on: 06/17/2019 01:38 PM   Modules accepted: Orders

## 2019-06-18 ENCOUNTER — Other Ambulatory Visit: Payer: Self-pay | Admitting: Internal Medicine

## 2019-07-10 ENCOUNTER — Other Ambulatory Visit: Payer: Self-pay | Admitting: Internal Medicine

## 2019-08-13 ENCOUNTER — Other Ambulatory Visit: Payer: Self-pay

## 2019-08-13 ENCOUNTER — Other Ambulatory Visit (INDEPENDENT_AMBULATORY_CARE_PROVIDER_SITE_OTHER): Payer: PPO

## 2019-08-13 DIAGNOSIS — E039 Hypothyroidism, unspecified: Secondary | ICD-10-CM | POA: Diagnosis not present

## 2019-08-13 LAB — TSH: TSH: 1.02 u[IU]/mL (ref 0.35–4.50)

## 2019-08-14 NOTE — Progress Notes (Signed)
Cardiology Office Note  Date:  08/15/2019   ID:  Brett, [name] 04/16/1941, MRN [id number]  PCP:  Brett Mc, MD   Chief Complaint  Patient presents with  . office visit    Pt has no complaints. Meds verbally reviewed w/ pt.    HPI:  [name] is a very pleasant 79 year old gentleman with history of  HTN,   DM II, HBA1C 6.7,  Hyperlipidemia, total chol 169, LDL 97 CAD,  CABG x4 at [facility]  in 2006,  previous stents Depression/insomnia Who presents for routine follow-up of his coronary artery disease  In general feels well with no complaints Exercise on a regular basis, walks with his wife Walks 2 miles QOD No change in endurance Denies shortness of breath or chest discomfort on exertion concerning for angina  Nonsmoker Working on his diabetes numbers  Labs reviewed HBA1C: 7 Total chol 158, LDL 97  On a prior office visit has stopped his statin and LDL went up to 195, up from 85 Compliant with Lipitor daily  Bradycardia on metoprolol Denies significant orthostasis  Reports he previously took Zetia.  Was playing soccer and felt some cramping in his leg and stopped the medication  EKG personally reviewed by myself on todays visit Shows normal sinus rhythm rate 58 bpm T wave abnormality V4 through V6, 1 and aVL  PMH:   has a past medical history of Allergy, Calcific Achilles tendonitis, Calculus of ureter, Coronary artery disease, Diabetes mellitus, H/O renal calculi (2012), Hematuria, unspecified, Hyperlipidemia, Hypertension, Impotence of organic origin, Myocardial infarction [facility]), Peyronie's disease, Pulmonary nodule seen on imaging study (2012), Sleep difficulties, Swelling, mass, or lump in chest, and Unspecified hypothyroidism.  PSH:    Past Surgical History:  Procedure Laterality Date  . CARDIAC CATHETERIZATION  2001   [facility] x3 stent  . COLONOSCOPY    . COLONOSCOPY WITH PROPOFOL N/A 01/24/2017   Procedure: COLONOSCOPY WITH  PROPOFOL;  Surgeon: Manya Silvas, MD;  Location: [facility] ENDOSCOPY;  Service: Endoscopy;  Laterality: N/A;  . CORONARY ARTERY BYPASS GRAFT  2006   4 vessel, after 3 or 4 stents   . CORONARY ARTERY BYPASS GRAFT    . OSTECTOMY CALCANEUS    . PENILE PROSTHESIS IMPLANT      Current Outpatient Medications  Medication Sig Dispense Refill  . atorvastatin (LIPITOR) 80 MG tablet TAKE 1 TABLET BY MOUTH EVERY DAY 90 tablet 1  . DULoxetine (CYMBALTA) 30 MG capsule TAKE 1 CAPSULE BY MOUTH EVERY DAY 90 capsule 1  . glipiZIDE (GLUCOTROL) 5 MG tablet TAKE 1 TABLET (5 MG TOTAL) BY MOUTH DAILY BEFORE BREAKFAST. 90 tablet 0  . glucose blood (FREESTYLE LITE) test strip CHECK BLOOD SUGAR DAILY AS DIRECTED 100 strip 5  . glucose monitoring kit (FREESTYLE) monitoring kit 1 each by Does not apply route as needed for other. For use daily to monitor diabetes.  Please include lancets .  Test once daily   E11.9 1 each 3  . Lancets (FREESTYLE) lancets Use as instructed 100 each 12  . levothyroxine (SYNTHROID) 125 MCG tablet Take 1 tablet (125 mcg total) by mouth daily. 90 tablet 1  . tamsulosin (FLOMAX) 0.4 MG CAPS capsule TAKE 1 CAPSULE (0.4 MG TOTAL) DAILY BY MOUTH. 90 capsule 1  . telmisartan (MICARDIS) 40 MG tablet TAKE 1/2 TABLET BY MOUTH ONCE A DAY 45 tablet 2   No current facility-administered medications for this visit.     Allergies:   Simvastatin and Eszopiclone  Social History:  The patient  reports that he quit smoking about 37 years ago. His smoking use included cigars. He has never used smokeless tobacco. He reports current alcohol use of about 1.0 standard drinks of alcohol per week. He reports that he does not use drugs.   Family History:   family history includes AAA (abdominal aortic aneurysm) in his mother; Heart disease (age of onset: 63) in his father; Hyperlipidemia in his mother.    Review of Systems: Review of Systems  Constitutional: Negative.   Respiratory: Negative.    Cardiovascular: Negative.   Gastrointestinal: Negative.   Musculoskeletal: Positive for myalgias.       Cold feet  Neurological: Negative.   Psychiatric/Behavioral: Negative.   All other systems reviewed and are negative.   PHYSICAL EXAM: VS:  BP 110/62 (BP Location: Left Arm, Patient Position: Sitting, Cuff Size: Normal)   Pulse (!) 58   Ht _0  (1.803 m)   Wt 191 lb 4 oz (86.8 kg)   SpO2 96%   BMI 26.67 kg/m  , BMI Body mass index is 26.67 kg/m. Constitutional:  oriented to person, place, and time. No distress.  HENT:  Head: Grossly normal Eyes:  no discharge. No scleral icterus.  Neck: No JVD, no carotid bruits  Cardiovascular: Regular rate and rhythm, no murmurs appreciated Pulmonary/Chest: Clear to auscultation bilaterally, no wheezes or rails Abdominal: Soft.  no distension.  no tenderness.  Musculoskeletal: Normal range of motion Neurological:  normal muscle tone. Coordination normal. No atrophy Skin: Skin warm and dry Psychiatric: normal affect, pleasant   Recent Labs: 06/15/2019: ALT 15; BUN 18; Creatinine, Ser 1.08; Potassium 4.0; Sodium 141 08/13/2019: TSH 1.02    Lipid Panel Lab Results  Component Value Date   CHOL 158 06/15/2019   HDL 48.20 06/15/2019   LDLCALC 97 06/15/2019   TRIG 62.0 06/15/2019     Wt Readings from Last 3 Encounters:  08/15/19 191 lb 4 oz (86.8 kg)  05/23/19 185 lb (83.9 kg)  03/22/19 189 lb 12.8 oz (86.1 kg)     ASSESSMENT AND PLAN:  Coronary artery disease involving coronary bypass graft of native heart with angina pectoris ([place]) - Plan: EKG 12-Lead Discussed goal LDL, recommend he restart Zetia 10 mg daily in addition to his Lipitor -Denies anginal symptoms, no further work-up at this time  Essential hypertension -  Blood pressure is well controlled on today's visit. No changes made to the medications.  Diabetes mellitus without complication ([place]) Hemoglobin A1c 7 We have encouraged continued exercise, careful diet  management in an effort to lose weight.  Hyperlipidemia LDL goal <70 Lipitor, add Zetia to reach goal Prescription sent in   Total encounter time more than 25 minutes  Greater than 50% was spent in counseling and coordination of care with the patient  Disposition:   F/U  12 months   No orders of the defined types were placed in this encounter.    Signed, Esmond Plants, M.D., Ph.D. 08/15/2019  [place], [place]

## 2019-08-15 ENCOUNTER — Other Ambulatory Visit: Payer: Self-pay

## 2019-08-15 ENCOUNTER — Encounter: Payer: Self-pay | Admitting: Cardiovascular Disease

## 2019-08-15 ENCOUNTER — Ambulatory Visit (INDEPENDENT_AMBULATORY_CARE_PROVIDER_SITE_OTHER): Payer: PPO | Admitting: Cardiovascular Disease

## 2019-08-15 VITALS — BP 110/62 | HR 58 | Ht 71.0 in | Wt 191.2 lb

## 2019-08-15 DIAGNOSIS — I25708 Atherosclerosis of coronary artery bypass graft(s), unspecified, with other forms of angina pectoris: Secondary | ICD-10-CM | POA: Diagnosis not present

## 2019-08-15 DIAGNOSIS — E119 Type 2 diabetes mellitus without complications: Secondary | ICD-10-CM | POA: Diagnosis not present

## 2019-08-15 DIAGNOSIS — E785 Hyperlipidemia, unspecified: Secondary | ICD-10-CM | POA: Diagnosis not present

## 2019-08-15 DIAGNOSIS — R001 Bradycardia, unspecified: Secondary | ICD-10-CM | POA: Diagnosis not present

## 2019-08-15 DIAGNOSIS — I10 Essential (primary) hypertension: Secondary | ICD-10-CM | POA: Diagnosis not present

## 2019-08-15 MED ORDER — EZETIMIBE 10 MG PO TABS: 10.0000 mg | ORAL_TABLET | Freq: Every day | ORAL | 3 refills | Status: DC

## 2019-08-15 NOTE — Patient Instructions (Addendum)
Medication Instructions:  Please start zetia 10 mg once a day, For cholesterol  If you need a refill on your cardiac medications before your next appointment, please call your pharmacy.    Lab work: No new labs needed   If you have labs (blood work) drawn today and your tests are completely normal, you will receive your results only by: Marland Kitchen MyChart Message (if you have MyChart) OR . A paper copy in the mail If you have any lab test that is abnormal or we need to change your treatment, we will call you to review the results.   Testing/Procedures: No new testing needed   Follow-Up: At [facility], you and your health needs are our priority.  As part of our continuing mission to provide you with exceptional heart care, we have created designated Provider Care Teams.  These Care Teams include your primary Cardiologist (physician) and Advanced Practice Providers (APPs -  Physician Assistants and Nurse Practitioners) who all work together to provide you with the care you need, when you need it.  . You will need a follow up appointment in 12 months   . Providers on your designated Care Team:   . Murray Hodgkins, NP . Christell Faith, PA-C . Marrianne Mood, PA-C  Any Other Special Instructions Will Be Listed Below (If Applicable).  For educational health videos Log in to : [email or web address] Or : [email or web address], password : [email or web address]

## 2019-09-12 ENCOUNTER — Other Ambulatory Visit: Payer: Self-pay | Admitting: Internal Medicine

## 2019-10-13 ENCOUNTER — Other Ambulatory Visit: Payer: Self-pay | Admitting: Internal Medicine

## 2019-11-06 ENCOUNTER — Other Ambulatory Visit: Payer: Self-pay | Admitting: Internal Medicine

## 2019-11-28 ENCOUNTER — Other Ambulatory Visit: Payer: Self-pay | Admitting: Internal Medicine

## 2019-11-28 DIAGNOSIS — E034 Atrophy of thyroid (acquired): Secondary | ICD-10-CM

## 2019-12-18 ENCOUNTER — Telehealth: Payer: Self-pay | Admitting: Internal Medicine

## 2019-12-18 NOTE — Telephone Encounter (Signed)
Pt called in wanted appt but nothing availabile think he has infection he is coming by to pick up a cup to give a urine sample he said his urine looks like it may have blood in it.

## 2019-12-18 NOTE — Telephone Encounter (Signed)
Pt has been scheduled for an appt with Brett [name] tomorrow.

## 2019-12-19 ENCOUNTER — Ambulatory Visit (INDEPENDENT_AMBULATORY_CARE_PROVIDER_SITE_OTHER): Payer: PPO | Admitting: Nurse Practitioner

## 2019-12-19 ENCOUNTER — Other Ambulatory Visit: Payer: Self-pay

## 2019-12-19 ENCOUNTER — Telehealth: Payer: Self-pay | Admitting: Internal Medicine

## 2019-12-19 VITALS — BP 124/62 | HR 60 | Temp 97.5°F | Resp 16 | Ht 71.0 in | Wt 185.2 lb

## 2019-12-19 DIAGNOSIS — R319 Hematuria, unspecified: Secondary | ICD-10-CM | POA: Diagnosis not present

## 2019-12-19 DIAGNOSIS — Z87442 Personal history of urinary calculi: Secondary | ICD-10-CM | POA: Diagnosis not present

## 2019-12-19 LAB — CBC WITH DIFFERENTIAL/PLATELET
Basophils Absolute: 0 10*3/uL (ref 0.0–0.1)
Basophils Relative: 0.6 % (ref 0.0–3.0)
Eosinophils Absolute: 0.4 10*3/uL (ref 0.0–0.7)
Eosinophils Relative: 7.1 % — ABNORMAL HIGH (ref 0.0–5.0)
HCT: 39.9 % (ref 39.0–52.0)
Hemoglobin: 13.2 g/dL (ref 13.0–17.0)
Lymphocytes Relative: 33.5 % (ref 12.0–46.0)
Lymphs Abs: 1.8 10*3/uL (ref 0.7–4.0)
MCHC: 33 g/dL (ref 30.0–36.0)
MCV: 90 fl (ref 78.0–100.0)
Monocytes Absolute: 0.5 10*3/uL (ref 0.1–1.0)
Monocytes Relative: 9.1 % (ref 3.0–12.0)
Neutro Abs: 2.7 10*3/uL (ref 1.4–7.7)
Neutrophils Relative %: 49.7 % (ref 43.0–77.0)
Platelets: 183 10*3/uL (ref 150.0–400.0)
RBC: 4.43 Mil/uL (ref 4.22–5.81)
RDW: 14.6 % (ref 11.5–15.5)
WBC: 5.4 10*3/uL (ref 4.0–10.5)

## 2019-12-19 LAB — URINALYSIS, MICROSCOPIC ONLY

## 2019-12-19 LAB — POCT URINALYSIS DIPSTICK
Bilirubin, UA: NEGATIVE
Glucose, UA: POSITIVE — AB
Ketones, UA: NEGATIVE
Leukocytes, UA: NEGATIVE
Nitrite, UA: NEGATIVE
Protein, UA: NEGATIVE
Spec Grav, UA: 1.02 (ref 1.010–1.025)
Urobilinogen, UA: 1 E.U./dL
pH, UA: 6 (ref 5.0–8.0)

## 2019-12-19 LAB — COMPREHENSIVE METABOLIC PANEL
ALT: 21 U/L (ref 0–53)
AST: 21 U/L (ref 0–37)
Albumin: 4.3 g/dL (ref 3.5–5.2)
Alkaline Phosphatase: 63 U/L (ref 39–117)
BUN: 19 mg/dL (ref 6–23)
CO2: 27 mEq/L (ref 19–32)
Calcium: 9.3 mg/dL (ref 8.4–10.5)
Chloride: 105 mEq/L (ref 96–112)
Creatinine, Ser: 0.97 mg/dL (ref 0.40–1.50)
GFR: 75.1 mL/min (ref >60.00)
Glucose, Bld: 158 mg/dL — ABNORMAL HIGH (ref 70–99)
Potassium: 4.3 mEq/L (ref 3.5–5.1)
Sodium: 138 mEq/L (ref 135–145)
Total Bilirubin: 0.9 mg/dL (ref 0.2–1.2)
Total Protein: 6.7 g/dL (ref 6.0–8.3)

## 2019-12-19 NOTE — Patient Instructions (Addendum)
We will call you with results of urinalysis when it is complete.  We are looking for blood in the urine called hematuria.  We will be looking for infection as well.  Further recommendations pending.   Hematuria, Adult Hematuria is blood in the urine. Blood may be visible in the urine, or it may be identified with a test. This condition can be caused by infections of the bladder, urethra, kidney, or prostate. Other possible causes include:  Kidney stones.  Cancer of the urinary tract.  Too much calcium in the urine.  Conditions that are passed from parent to child (inherited conditions).  Exercise that requires a lot of energy. Infections can usually be treated with medicine, and a kidney stone usually will pass through your urine. If neither of these is the cause of your hematuria, more tests may be needed to identify the cause of your symptoms. It is very important to tell your health care provider about any blood in your urine, even if it is painless or the blood stops without treatment. Blood in the urine, when it happens and then stops and then happens again, can be a symptom of a very serious condition, including cancer. There is no pain in the initial stages of many urinary cancers. Follow these instructions at home: Medicines  Take over-the-counter and prescription medicines only as told by your health care provider.  If you were prescribed an antibiotic medicine, take it as told by your health care provider. Do not stop taking the antibiotic even if you start to feel better. Eating and drinking  Drink enough fluid to keep your urine clear or pale yellow. It is recommended that you drink 3-4 quarts (2.8-3.8 L) a day. If you have been diagnosed with an infection, it is recommended that you drink cranberry juice in addition to large amounts of water.  Avoid caffeine, tea, and carbonated beverages. These tend to irritate the bladder.  Avoid alcohol because it may irritate the  prostate (men). General instructions  If you have been diagnosed with a kidney stone, follow your health care provider's instructions about straining your urine to catch the stone.  Empty your bladder often. Avoid holding urine for long periods of time.  If you are male: ? After a bowel movement, wipe from front to back and use each piece of toilet paper only once. ? Empty your bladder before and after sex.  Pay attention to any changes in your symptoms. Tell your health care provider about any changes or any new symptoms.  It is your responsibility to get your test results. Ask your health care provider, or the department performing the test, when your results will be ready.  Keep all follow-up visits as told by your health care provider. This is important. Contact a health care provider if:  You develop back pain.  You have a fever.  You have nausea or vomiting.  Your symptoms do not improve after 3 days.  Your symptoms get worse. Get help right away if:  You develop severe vomiting and are unable take medicine without vomiting.  You develop severe pain in your back or abdomen even though you are taking medicine.  You pass a large amount of blood in your urine.  You pass blood clots in your urine.  You feel very weak or like you might faint.  You faint. Summary  Hematuria is blood in the urine. It has many possible causes.  It is very important that you tell your health care  provider about any blood in your urine, even if it is painless or the blood stops without treatment.  Take over-the-counter and prescription medicines only as told by your health care provider.  Drink enough fluid to keep your urine clear or pale yellow. This information is not intended to replace advice given to you by your health care provider. Make sure you discuss any questions you have with your health care provider. Document Revised: 10/18/2018 Document Reviewed: 06/26/2016 Elsevier  Patient Education  2020 [employer].

## 2019-12-19 NOTE — Telephone Encounter (Signed)
Pt called in and stated that he was checking on  results and his home phone is down contact him on this phone please [phone]

## 2019-12-19 NOTE — Progress Notes (Signed)
Established Patient Office Visit  Subjective:  Patient ID: Brett [name], male    DOB: [birth year]  Age: 79 y.o. MRN: [id number]  CC:  Chief Complaint  Patient presents with  . Hematuria    HPI Brett [name] is a 79 year old who presents for dark-colored urine that he noticed on Sunday.    On Sunday, he noticed his urine looked dark yellow. He thought he needed to drink more.  The next voids showed rusty brown urine.  He drank water, and did notice that it started to clear up over the next few days and today it is a lot lighter. Patient has had no problems with urinary burning, urgency, frequency, back pain or abdominal pain.  He has had no fevers or chills.  No nausea or vomiting. The patient has a history of kidney stones several times >10 years ago and reports he has no symptoms of pain.  He did have a urinary tract infection after his CABG surgery in 2006 when he had a Foley catheter.  He has had no problems with his bladder since.  Past Medical History:  Diagnosis Date  . Allergy    resolved after CABG  . Calcific Achilles tendonitis   . Calculus of ureter   . Coronary artery disease   . Diabetes mellitus   . H/O renal calculi 2012  . Hematuria, unspecified   . Hyperlipidemia   . Hypertension   . Impotence of organic origin   . Myocardial infarction ([place])   . Peyronie's disease   . Pulmonary nodule seen on imaging study 2012  . Sleep difficulties   . Swelling, mass, or lump in chest   . Unspecified hypothyroidism     Past Surgical History:  Procedure Laterality Date  . CARDIAC CATHETERIZATION  2001   [facility] x3 stent  . COLONOSCOPY    . COLONOSCOPY WITH PROPOFOL N/A 01/24/2017   Procedure: COLONOSCOPY WITH PROPOFOL;  Surgeon: Manya Silvas, MD;  Location: [facility] ENDOSCOPY;  Service: Endoscopy;  Laterality: N/A;  . CORONARY ARTERY BYPASS GRAFT  2006   4 vessel, after 3 or 4 stents   . CORONARY ARTERY BYPASS GRAFT    . OSTECTOMY CALCANEUS    . PENILE  PROSTHESIS IMPLANT      Family History  Problem Relation Age of Onset  . Hyperlipidemia Mother   . AAA (abdominal aortic aneurysm) Mother   . Heart disease Father 82    Social History   Socioeconomic History  . Marital status: Married    Spouse name: Audelia Acton now deceased  . Number of children: Not on file  . Years of education: Not on file  . Highest education level: Not on file  Occupational History    Employer: retired  Tobacco Use  . Smoking status: Former Smoker    Types: Cigars    Quit date: 10/04/1981    Years since quitting: 38.2  . Smokeless tobacco: Never Used  Vaping Use  . Vaping Use: Never used  Substance and Sexual Activity  . Alcohol use: Yes    Alcohol/week: 1.0 standard drink    Types: 1 Cans of beer per week    Comment: everyday  . Drug use: No  . Sexual activity: Not Currently  Other Topics Concern  . Not on file  Social History Narrative  . Not on file   Social Determinants of Health   Financial Resource Strain:   . Difficulty of Paying Living Expenses:   Food Insecurity:   .  Worried About Charity fundraiser in the Last Year:   . Arboriculturist in the Last Year:   Transportation Needs:   . Film/video editor (Medical):   Marland Kitchen Lack of Transportation (Non-Medical):   Physical Activity:   . Days of Exercise per Week:   . Minutes of Exercise per Session:   Stress:   . Feeling of Stress :   Social Connections:   . Frequency of Communication with Friends and Family:   . Frequency of Social Gatherings with Friends and Family:   . Attends Religious Services:   . Active Member of Clubs or Organizations:   . Attends Archivist Meetings:   Marland Kitchen Marital Status:   Intimate Partner Violence:   . Fear of Current or Ex-Partner:   . Emotionally Abused:   Marland Kitchen Physically Abused:   . Sexually Abused:     Outpatient Medications Prior to Visit  Medication Sig Dispense Refill  . atorvastatin (LIPITOR) 80 MG tablet TAKE 1 TABLET BY MOUTH EVERY  DAY 90 tablet 1  . DULoxetine (CYMBALTA) 30 MG capsule TAKE 1 CAPSULE BY MOUTH EVERY DAY 90 capsule 1  . ezetimibe (ZETIA) 10 MG tablet Take 1 tablet (10 mg total) by mouth daily. 90 tablet 3  . glipiZIDE (GLUCOTROL) 5 MG tablet TAKE 1 TABLET (5 MG TOTAL) BY MOUTH DAILY BEFORE BREAKFAST. 90 tablet 0  . glucose blood (FREESTYLE LITE) test strip CHECK BLOOD SUGAR DAILY AS DIRECTED 100 strip 5  . glucose monitoring kit (FREESTYLE) monitoring kit 1 each by Does not apply route as needed for other. For use daily to monitor diabetes.  Please include lancets .  Test once daily   E11.9 1 each 3  . Lancets (FREESTYLE) lancets Use as instructed 100 each 12  . levothyroxine (SYNTHROID) 125 MCG tablet TAKE 1 TABLET BY MOUTH EVERY DAY 90 tablet 1  . tamsulosin (FLOMAX) 0.4 MG CAPS capsule TAKE 1 CAPSULE (0.4 MG TOTAL) DAILY BY MOUTH. 90 capsule 1  . telmisartan (MICARDIS) 40 MG tablet TAKE 1/2 TABLET BY MOUTH ONCE A DAY 45 tablet 2   No facility-administered medications prior to visit.    Allergies  Allergen Reactions  . Simvastatin Other (See Comments)  . Eszopiclone Rash    ROS Review of Systems  Constitutional: Negative for chills, fever and unexpected weight change.  Cardiovascular: Negative.   Gastrointestinal: Negative.   Genitourinary: Positive for hematuria. Negative for decreased urine volume, difficulty urinating, dysuria, flank pain, frequency, penile pain, penile swelling, scrotal swelling, testicular pain and urgency.  Musculoskeletal: Negative for back pain.  Skin: Negative for rash.      Objective:    Physical Exam Vitals reviewed.  Constitutional:      Appearance: Normal appearance. He is normal weight.  Eyes:     Pupils: Pupils are equal, round, and reactive to light.  Cardiovascular:     Rate and Rhythm: Normal rate and regular rhythm.     Pulses: Normal pulses.     Heart sounds: Normal heart sounds.  Pulmonary:     Effort: Pulmonary effort is normal.     Breath  sounds: Normal breath sounds.  Abdominal:     Palpations: Abdomen is soft.     Tenderness: There is no abdominal tenderness.  Musculoskeletal:        General: Normal range of motion.     Cervical back: Normal range of motion and neck supple.     Right lower leg: No edema.  Left lower leg: No edema.  Skin:    General: Skin is warm and dry.  Neurological:     General: No focal deficit present.     Mental Status: He is oriented to person, place, and time.  Psychiatric:        Mood and Affect: Mood normal.        Behavior: Behavior normal.     BP 124/62   Pulse 60   Temp (!) 97.5 F (36.4 C) (Oral)   Resp 16   Ht 5' 11"  (1.803 m)   Wt 185 lb 3.2 oz (84 kg)   SpO2 98%   BMI 25.83 kg/m  Wt Readings from Last 3 Encounters:  12/19/19 185 lb 3.2 oz (84 kg)  08/15/19 191 lb 4 oz (86.8 kg)  05/23/19 185 lb (83.9 kg)     Health Maintenance Due  Topic Date Due  . Hepatitis C Screening  Never done  . FOOT EXAM  04/04/2019  . HEMOGLOBIN A1C  12/13/2019    There are no preventive care reminders to display for this patient.  Lab Results  Component Value Date   TSH 1.02 08/13/2019   Lab Results  Component Value Date   WBC 5.6 09/16/2016   HGB 14.3 09/16/2016   HCT 41.3 11/22/2013   MCV 89.6 11/22/2013   PLT 189 09/16/2016   Lab Results  Component Value Date   NA 141 06/15/2019   K 4.0 06/15/2019   CO2 29 06/15/2019   GLUCOSE 124 (H) 06/15/2019   BUN 18 06/15/2019   CREATININE 1.08 06/15/2019   BILITOT 0.6 06/15/2019   ALKPHOS 72 06/15/2019   AST 19 06/15/2019   ALT 15 06/15/2019   PROT 7.3 06/15/2019   ALBUMIN 4.2 06/15/2019   CALCIUM 9.4 06/15/2019   GFR 66.44 06/15/2019   Lab Results  Component Value Date   CHOL 158 06/15/2019   Lab Results  Component Value Date   HDL 48.20 06/15/2019   Lab Results  Component Value Date   LDLCALC 97 06/15/2019   Lab Results  Component Value Date   TRIG 62.0 06/15/2019   Lab Results  Component Value Date     CHOLHDL 3 06/15/2019   Lab Results  Component Value Date   HGBA1C 7.0 (H) 06/15/2019      Assessment & Plan:   Problem List Items Addressed This Visit      Other   Hematuria - Primary   Relevant Orders   POCT urinalysis dipstick (Completed)   Urine Microscopic   Urine Culture   Comp Met (CMET)   CBC with Differential/Platelet      No orders of the defined types were placed in this encounter. Well appearing and in no pain and no sx of UTI. Suspect kidney stone.  Painless hematuria- remote hx of kidney stones, Ordering a CT abd/pelvis W/WO, Urology please see for possible cystoscopy and management. If above negative- he will need Nephrology consult.  F/up Dr. Derrel Nip in 2 weeks  Follow-up: Return if symptoms worsen or fail to improve.   This visit occurred during the SARS-CoV-2 public health emergency.  Safety protocols were in place, including screening questions prior to the visit, additional usage of staff PPE, and extensive cleaning of exam room while observing appropriate contact time as indicated for disinfecting solutions.   Denice Paradise, NP

## 2019-12-20 ENCOUNTER — Encounter: Payer: Self-pay | Admitting: Nurse Practitioner

## 2019-12-20 ENCOUNTER — Telehealth: Payer: Self-pay | Admitting: Internal Medicine

## 2019-12-20 LAB — URINE CULTURE
MICRO NUMBER:: 10704151
Result:: NO GROWTH
SPECIMEN QUALITY:: ADEQUATE

## 2019-12-20 NOTE — Telephone Encounter (Signed)
Pt aware of results 

## 2019-12-20 NOTE — Telephone Encounter (Signed)
Tried calling pt call would not go through. Sent a Therapist, music.

## 2019-12-23 ENCOUNTER — Other Ambulatory Visit: Payer: Self-pay | Admitting: Internal Medicine

## 2019-12-26 ENCOUNTER — Ambulatory Visit: Payer: Self-pay | Admitting: Urology

## 2019-12-31 ENCOUNTER — Ambulatory Visit
Admission: RE | Admit: 2019-12-31 | Discharge: 2019-12-31 | Disposition: A | Discharge status: Home / Self Care | Payer: PPO | Source: Ambulatory Visit | Attending: Nurse Practitioner | Admitting: Nurse Practitioner

## 2019-12-31 ENCOUNTER — Other Ambulatory Visit: Payer: Self-pay

## 2019-12-31 DIAGNOSIS — N200 Calculus of kidney: Secondary | ICD-10-CM | POA: Diagnosis not present

## 2019-12-31 DIAGNOSIS — I700 Atherosclerosis of aorta: Secondary | ICD-10-CM | POA: Diagnosis not present

## 2019-12-31 DIAGNOSIS — R319 Hematuria, unspecified: Secondary | ICD-10-CM | POA: Diagnosis not present

## 2019-12-31 DIAGNOSIS — Z87442 Personal history of urinary calculi: Secondary | ICD-10-CM | POA: Insufficient documentation

## 2019-12-31 DIAGNOSIS — N2889 Other specified disorders of kidney and ureter: Secondary | ICD-10-CM | POA: Diagnosis not present

## 2019-12-31 DIAGNOSIS — N281 Cyst of kidney, acquired: Secondary | ICD-10-CM | POA: Diagnosis not present

## 2019-12-31 MED ORDER — IOHEXOL 300 MG/ML  SOLN
125.0000 mL | Freq: Once | INTRAMUSCULAR | Status: AC | PRN
  Administered 2019-12-31: 125 mL via INTRAVENOUS

## 2020-01-02 ENCOUNTER — Other Ambulatory Visit: Payer: Self-pay

## 2020-01-02 ENCOUNTER — Ambulatory Visit (INDEPENDENT_AMBULATORY_CARE_PROVIDER_SITE_OTHER): Payer: PPO | Admitting: Urology

## 2020-01-02 ENCOUNTER — Encounter: Payer: Self-pay | Admitting: Urology

## 2020-01-02 VITALS — BP 121/67 | HR 70 | Ht 71.0 in | Wt 185.0 lb

## 2020-01-02 DIAGNOSIS — N200 Calculus of kidney: Secondary | ICD-10-CM | POA: Diagnosis not present

## 2020-01-02 DIAGNOSIS — R310 Gross hematuria: Secondary | ICD-10-CM | POA: Diagnosis not present

## 2020-01-02 DIAGNOSIS — N401 Enlarged prostate with lower urinary tract symptoms: Secondary | ICD-10-CM

## 2020-01-02 DIAGNOSIS — R351 Nocturia: Secondary | ICD-10-CM | POA: Diagnosis not present

## 2020-01-02 NOTE — Progress Notes (Signed)
01/02/20 3:57 PM   Brett [name] Seen 1941/03/24 [id number]  CC: Gross hematuria  HPI: I saw Brett [name] in urology clinic for evaluation of gross hematuria.  He reports 3 days of dark blood in the urine a few weeks ago that was not associated with any pain or other symptoms.  This resolved spontaneously and is voiding clear yellow urine currently.  His history is notable for a inflatable penile prosthesis placed around 10 years ago.  He has a history of nephrolithiasis, all of which he has passed spontaneously.  A CT urogram ordered by his PCP on 12/31/2019 showed a 9 mm stone in the right renal pelvis just above the right UPJ.  There were no other urologic abnormalities.  He denies any flank pain or urinary symptoms.  He denies any history of smoking or other carcinogenic exposures.  Urinalysis today with 0-5 WBCs, 11-30 RBCs, no bacteria, nitrite negative.  PMH: Past Medical History:  Diagnosis Date  . Allergy    resolved after CABG  . Calcific Achilles tendonitis   . Calculus of ureter   . Coronary artery disease   . Diabetes mellitus   . H/O renal calculi 2012  . Hematuria, unspecified   . Hyperlipidemia   . Hypertension   . Impotence of organic origin   . Myocardial infarction ([place])   . Peyronie's disease   . Pulmonary nodule seen on imaging study 2012  . Sleep difficulties   . Swelling, mass, or lump in chest   . Unspecified hypothyroidism     Surgical History: Past Surgical History:  Procedure Laterality Date  . CARDIAC CATHETERIZATION  2001   [facility] x3 stent  . COLONOSCOPY    . COLONOSCOPY WITH PROPOFOL N/A 01/24/2017   Procedure: COLONOSCOPY WITH PROPOFOL;  Surgeon: Manya Silvas, MD;  Location: [facility] ENDOSCOPY;  Service: Endoscopy;  Laterality: N/A;  . CORONARY ARTERY BYPASS GRAFT  2006   4 vessel, after 3 or 4 stents   . CORONARY ARTERY BYPASS GRAFT    . OSTECTOMY CALCANEUS    . PENILE PROSTHESIS IMPLANT       Family History: Family History    Problem Relation Age of Onset  . Hyperlipidemia Mother   . AAA (abdominal aortic aneurysm) Mother   . Heart disease Father 73    Social History:  reports that he quit smoking about 38 years ago. His smoking use included cigars. He has never used smokeless tobacco. He reports current alcohol use of about 1.0 standard drink of alcohol per week. He reports that he does not use drugs.  Physical Exam: BP 121/67   Pulse 70   Ht 5\' 11"  (1.803 m)   Wt 185 lb (83.9 kg)   BMI 25.80 kg/m    Constitutional:  Alert and oriented, No acute distress. Cardiovascular: No clubbing, cyanosis, or edema. Respiratory: Normal respiratory effort, no increased work of breathing. GI: Abdomen is soft, nontender, nondistended, no abdominal masses GU: No CVA tenderness, phallus with palpable penile prosthesis, no obvious erosion, small meatus with subtle hypospadias  Laboratory Data: Reviewed, see HPI  Pertinent Imaging: I have personally reviewed the CT, there is a 9 mm right renal pelvis stone just above the right UPJ, 1600HU, 10cm SSD, not clearly seen on scout CT  Assessment & Plan:   He is a 79 year old male with asymptomatic gross hematuria and a 9 mm right renal pelvis stone just above the right UPJ.  We discussed common possible etiologies of hematuria including BPH, malignancy,  urolithiasis, medical renal disease, and idiopathic. Standard workup recommended by the AUA includes imaging with CT urogram to assess the upper tracts, and cystoscopy. Cytology is performed on patient's with gross hematuria to look for malignant cells in the urine.  We discussed other possible etiologies including erosion of his penile prosthesis.  We discussed various treatment options for urolithiasis including observation with or without medical expulsive therapy, shockwave lithotripsy (SWL), ureteroscopy and laser lithotripsy with stent placement, and percutaneous nephrolithotomy.  We discussed that management is based on  stone size, location, density, patient co-morbidities, and patient preference.   Stones <77mm in size have a >80% spontaneous passage rate. Data surrounding the use of tamsulosin for medical expulsive therapy is controversial, but meta analyses suggests it is most efficacious for distal stones between 5-94mm in size. Possible side effects include dizziness/lightheadedness, and retrograde ejaculation.  SWL has a lower stone free rate in a single procedure, but also a lower complication rate compared to ureteroscopy and avoids a stent and associated stent related symptoms. Possible complications include renal hematoma, steinstrasse, and need for additional treatment.  Ureteroscopy with laser lithotripsy and stent placement has a higher stone free rate than SWL in a single procedure, however increased complication rate including possible infection, ureteral injury, bleeding, and stent related morbidity. Common stent related symptoms include dysuria, urgency/frequency, and flank pain.  I recommended ureteroscopy, laser lithotripsy, and stent in the setting of his very dense stone, as well as his need for cystoscopy regardless to complete hematuria work-up.  After an extensive discussion of the risks and benefits of the above treatment options, the patient would like to proceed with cystoscopy, right ureteroscopy, laser lithotripsy, and stent placement   [name], MD 01/02/2020  [place] [address] [place], [place] [address] [phone]

## 2020-01-02 NOTE — H&P (View-Only) (Signed)
01/02/20 3:57 PM   Brett [name] Seen Mar 21, 1941 [id number]  CC: Gross hematuria  HPI: I saw Mr. Clendenin in urology clinic for evaluation of gross hematuria.  He reports 3 days of dark blood in the urine a few weeks ago that was not associated with any pain or other symptoms.  This resolved spontaneously and is voiding clear yellow urine currently.  His history is notable for a inflatable penile prosthesis placed around 10 years ago.  He has a history of nephrolithiasis, all of which he has passed spontaneously.  A CT urogram ordered by his PCP on 12/31/2019 showed a 9 mm stone in the right renal pelvis just above the right UPJ.  There were no other urologic abnormalities.  He denies any flank pain or urinary symptoms.  He denies any history of smoking or other carcinogenic exposures.  Urinalysis today with 0-5 WBCs, 11-30 RBCs, no bacteria, nitrite negative.  PMH: Past Medical History:  Diagnosis Date   Allergy    resolved after CABG   Calcific Achilles tendonitis    Calculus of ureter    Coronary artery disease    Diabetes mellitus    H/O renal calculi 2012   Hematuria, unspecified    Hyperlipidemia    Hypertension    Impotence of organic origin    Myocardial infarction [facility])    Peyronie's disease    Pulmonary nodule seen on imaging study 2012   Sleep difficulties    Swelling, mass, or lump in chest    Unspecified hypothyroidism     Surgical History: Past Surgical History:  Procedure Laterality Date   CARDIAC CATHETERIZATION  2001   [facility] x3 stent   COLONOSCOPY     COLONOSCOPY WITH PROPOFOL N/A 01/24/2017   Procedure: COLONOSCOPY WITH PROPOFOL;  Surgeon: Manya Silvas, MD;  Location: [facility] ENDOSCOPY;  Service: Endoscopy;  Laterality: N/A;   CORONARY ARTERY BYPASS GRAFT  2006   4 vessel, after 3 or 4 stents    CORONARY ARTERY BYPASS GRAFT     OSTECTOMY CALCANEUS     PENILE PROSTHESIS IMPLANT       Family History: Family History    Problem Relation Age of Onset   Hyperlipidemia Mother    AAA (abdominal aortic aneurysm) Mother    Heart disease Father 61    Social History:  reports that he quit smoking about 38 years ago. His smoking use included cigars. He has never used smokeless tobacco. He reports current alcohol use of about 1.0 standard drink of alcohol per week. He reports that he does not use drugs.  Physical Exam: BP 121/67    Pulse 70    Ht 5\' 11"  (1.803 m)    Wt 185 lb (83.9 kg)    BMI 25.80 kg/m    Constitutional:  Alert and oriented, No acute distress. Cardiovascular: No clubbing, cyanosis, or edema. Respiratory: Normal respiratory effort, no increased work of breathing. GI: Abdomen is soft, nontender, nondistended, no abdominal masses GU: No CVA tenderness, phallus with palpable penile prosthesis, no obvious erosion, small meatus with subtle hypospadias  Laboratory Data: Reviewed, see HPI  Pertinent Imaging: I have personally reviewed the CT, there is a 9 mm right renal pelvis stone just above the right UPJ, 1600HU, 10cm SSD, not clearly seen on scout CT  Assessment & Plan:   He is a 79 year old male with asymptomatic gross hematuria and a 9 mm right renal pelvis stone just above the right UPJ.  We discussed common possible etiologies of  hematuria including BPH, malignancy, urolithiasis, medical renal disease, and idiopathic. Standard workup recommended by the AUA includes imaging with CT urogram to assess the upper tracts, and cystoscopy. Cytology is performed on patient's with gross hematuria to look for malignant cells in the urine.  We discussed other possible etiologies including erosion of his penile prosthesis.  We discussed various treatment options for urolithiasis including observation with or without medical expulsive therapy, shockwave lithotripsy (SWL), ureteroscopy and laser lithotripsy with stent placement, and percutaneous nephrolithotomy.  We discussed that management is based on  stone size, location, density, patient co-morbidities, and patient preference.   Stones <19mm in size have a >80% spontaneous passage rate. Data surrounding the use of tamsulosin for medical expulsive therapy is controversial, but meta analyses suggests it is most efficacious for distal stones between 5-46mm in size. Possible side effects include dizziness/lightheadedness, and retrograde ejaculation.  SWL has a lower stone free rate in a single procedure, but also a lower complication rate compared to ureteroscopy and avoids a stent and associated stent related symptoms. Possible complications include renal hematoma, steinstrasse, and need for additional treatment.  Ureteroscopy with laser lithotripsy and stent placement has a higher stone free rate than SWL in a single procedure, however increased complication rate including possible infection, ureteral injury, bleeding, and stent related morbidity. Common stent related symptoms include dysuria, urgency/frequency, and flank pain.  I recommended ureteroscopy, laser lithotripsy, and stent in the setting of his very dense stone, as well as his need for cystoscopy regardless to complete hematuria work-up.  After an extensive discussion of the risks and benefits of the above treatment options, the patient would like to proceed with cystoscopy, right ureteroscopy, laser lithotripsy, and stent placement   [name], MD 01/02/2020  [place] [address], [place], [place] [address] [phone]

## 2020-01-03 ENCOUNTER — Other Ambulatory Visit: Payer: Self-pay

## 2020-01-03 ENCOUNTER — Other Ambulatory Visit: Payer: Self-pay | Admitting: Urology

## 2020-01-03 DIAGNOSIS — N200 Calculus of kidney: Secondary | ICD-10-CM

## 2020-01-03 LAB — URINALYSIS, COMPLETE
Bilirubin, UA: NEGATIVE
Ketones, UA: NEGATIVE
Leukocytes,UA: NEGATIVE
Nitrite, UA: NEGATIVE
Protein,UA: NEGATIVE
Specific Gravity, UA: 1.015 (ref 1.005–1.030)
Urobilinogen, Ur: 2 mg/dL — ABNORMAL HIGH (ref 0.2–1.0)
pH, UA: 6.5 (ref 5.0–7.5)

## 2020-01-03 LAB — MICROSCOPIC EXAMINATION: Bacteria, UA: NONE SEEN

## 2020-01-07 ENCOUNTER — Other Ambulatory Visit: Payer: Self-pay

## 2020-01-07 ENCOUNTER — Encounter: Payer: Self-pay | Admitting: Internal Medicine

## 2020-01-07 ENCOUNTER — Ambulatory Visit (INDEPENDENT_AMBULATORY_CARE_PROVIDER_SITE_OTHER): Payer: PPO | Admitting: Internal Medicine

## 2020-01-07 VITALS — BP 104/66 | HR 67 | Temp 98.5°F | Resp 15 | Ht 71.0 in | Wt 184.0 lb

## 2020-01-07 DIAGNOSIS — F418 Other specified anxiety disorders: Secondary | ICD-10-CM | POA: Diagnosis not present

## 2020-01-07 DIAGNOSIS — I10 Essential (primary) hypertension: Secondary | ICD-10-CM | POA: Diagnosis not present

## 2020-01-07 DIAGNOSIS — E785 Hyperlipidemia, unspecified: Secondary | ICD-10-CM

## 2020-01-07 DIAGNOSIS — E119 Type 2 diabetes mellitus without complications: Secondary | ICD-10-CM | POA: Diagnosis not present

## 2020-01-07 LAB — POCT GLYCOSYLATED HEMOGLOBIN (HGB A1C): Hemoglobin A1C: 6.5 % — AB (ref 4.0–5.6)

## 2020-01-07 NOTE — Progress Notes (Signed)
Subjective:  Patient ID: Brett [name], male    DOB: [birth year]  Age: 79 y.o. MRN: [id number]  CC: The primary encounter diagnosis was Diabetes mellitus without complication ([place]). Diagnoses of Depression with anxiety, Hyperlipidemia LDL goal <70, and Essential hypertension were also pertinent to this visit.  HPI Brett [name] presents for FOLLOW UP   This visit occurred during the SARS-CoV-2 public health emergency.  Safety protocols were in place, including screening questions prior to the visit, additional usage of staff PPE, and extensive cleaning of exam room while observing appropriate contact time as indicated for disinfecting solutions.   Depression follow up:  He feels that his symptoms are resolved on current dose of cymbalta, but he remains troubled and restless by the [employer] and world events.  So he prefers to remain on cymbalta.   Dm follow up:   He feels generally well, is exercising several times per week and checking blood sugars once daily at variable times.  BS have been under 130 fasting and < 150 post prandially.  Denies any recent hypoglyemic events.  Taking his medications as directed. Following a carbohydrate modified diet 6 days per week. Denies numbness, burning and tingling of extremities. Appetite is good.     Hypertension: patient checks blood pressure twice weekly at home.  Readings have been for the most part < 140/80 at rest . Patient is following a reduce salt diet most days and is taking medications as prescribed For cystoscopy laser lithotripsy on august 13 by [facility]   Outpatient Medications Prior to Visit  Medication Sig Dispense Refill   atorvastatin (LIPITOR) 80 MG tablet TAKE 1 TABLET BY MOUTH EVERY DAY (Patient taking differently: Take by mouth daily. ) 90 tablet 1   DULoxetine (CYMBALTA) 30 MG capsule TAKE 1 CAPSULE BY MOUTH EVERY DAY (Patient taking differently: Take 30 mg by mouth daily. ) 90 capsule 1   ezetimibe (ZETIA) 10 MG tablet Take 1  tablet (10 mg total) by mouth daily. 90 tablet 3   glipiZIDE (GLUCOTROL) 5 MG tablet TAKE 1 TABLET (5 MG TOTAL) BY MOUTH DAILY BEFORE BREAKFAST. (Patient taking differently: Take 2.5 mg by mouth 2 (two) times daily before a meal. ) 90 tablet 0   glucose blood (FREESTYLE LITE) test strip CHECK BLOOD SUGAR DAILY AS DIRECTED 100 strip 5   glucose monitoring kit (FREESTYLE) monitoring kit 1 each by Does not apply route as needed for other. For use daily to monitor diabetes.  Please include lancets .  Test once daily   E11.9 1 each 3   Lancets (FREESTYLE) lancets Use as instructed 100 each 12   levothyroxine (SYNTHROID) 125 MCG tablet TAKE 1 TABLET BY MOUTH EVERY DAY (Patient taking differently: Take 125 mcg by mouth daily before breakfast. ) 90 tablet 1   tamsulosin (FLOMAX) 0.4 MG CAPS capsule TAKE 1 CAPSULE (0.4 MG TOTAL) DAILY BY MOUTH. 90 capsule 1   telmisartan (MICARDIS) 40 MG tablet TAKE 1/2 TABLET BY MOUTH ONCE A DAY (Patient taking differently: Take 20 mg by mouth daily. ) 45 tablet 2   No facility-administered medications prior to visit.    Review of Systems;  Patient denies headache, fevers, malaise, unintentional weight loss, skin rash, eye pain, sinus congestion and sinus pain, sore throat, dysphagia,  hemoptysis , cough, dyspnea, wheezing, chest pain, palpitations, orthopnea, edema, abdominal pain, nausea, melena, diarrhea, constipation, flank pain, dysuria, hematuria, urinary  Frequency, nocturia, numbness, tingling, seizures,  Focal weakness, Loss of consciousness,  Tremor, insomnia, depression, anxiety,  and suicidal ideation.      Objective:  BP 104/66 (BP Location: Left Arm, Patient Position: Sitting, Cuff Size: Normal)    Pulse 67    Temp 98.5 F (36.9 C) (Oral)    Resp 15    Ht 5' 11"  (1.803 m)    Wt 184 lb (83.5 kg)    SpO2 96%    BMI 25.66 kg/m   BP Readings from Last 3 Encounters:  01/07/20 104/66  01/02/20 121/67  12/19/19 124/62    Wt Readings from Last 3  Encounters:  01/07/20 184 lb (83.5 kg)  01/02/20 185 lb (83.9 kg)  12/19/19 185 lb 3.2 oz (84 kg)    General appearance: alert, cooperative and appears stated age Ears: normal TM's and external ear canals both ears Throat: lips, mucosa, and tongue normal; teeth and gums normal Neck: no adenopathy, no carotid bruit, supple, symmetrical, trachea midline and thyroid not enlarged, symmetric, no tenderness/mass/nodules Back: symmetric, no curvature. ROM normal. No CVA tenderness. Lungs: clear to auscultation bilaterally Heart: regular rate and rhythm, S1, S2 normal, no murmur, click, rub or gallop Abdomen: soft, non-tender; bowel sounds normal; no masses,  no organomegaly Pulses: 2+ and symmetric Skin: Skin color, texture, turgor normal. No rashes or lesions Lymph nodes: Cervical, supraclavicular, and axillary nodes normal.  Lab Results  Component Value Date   HGBA1C 6.5 (A) 01/07/2020   HGBA1C 7.0 (H) 06/15/2019   HGBA1C 7.0 (H) 10/16/2018    Lab Results  Component Value Date   CREATININE 0.97 12/19/2019   CREATININE 1.08 06/15/2019   CREATININE 1.10 10/16/2018    Lab Results  Component Value Date   WBC 5.4 12/19/2019   HGB 13.2 12/19/2019   HCT 39.9 12/19/2019   PLT 183.0 12/19/2019   GLUCOSE 158 (H) 12/19/2019   CHOL 158 06/15/2019   TRIG 62.0 06/15/2019   HDL 48.20 06/15/2019   LDLDIRECT 193.0 05/18/2017   LDLCALC 97 06/15/2019   ALT 21 12/19/2019   AST 21 12/19/2019   NA 138 12/19/2019   K 4.3 12/19/2019   CL 105 12/19/2019   CREATININE 0.97 12/19/2019   BUN 19 12/19/2019   CO2 27 12/19/2019   TSH 1.02 08/13/2019   PSA 0.42 04/03/2018   HGBA1C 6.5 (A) 01/07/2020   MICROALBUR 1.0 06/15/2019    CT ABDOMEN PELVIS W WO CONTRAST  Result Date: 12/31/2019 CLINICAL DATA:  Hematuria, history of kidney stones EXAM: CT ABDOMEN AND PELVIS WITHOUT AND WITH CONTRAST TECHNIQUE: Multidetector CT imaging of the abdomen and pelvis was performed following the standard  protocol before and following the bolus administration of intravenous contrast. CONTRAST:  150m OMNIPAQUE IOHEXOL 300 MG/ML  SOLN COMPARISON:  CT chest, 11/22/2016 FINDINGS: Lower chest: Rounded subpleural opacity of the dependent left lung base measuring 2.8 x 2.0 cm with a small associated left pleural effusion (series 11, image 15). There is adjacent bronchiectasis and swirling, bandlike scarring or atelectasis. Additional, partially imaged, more bandlike subpleural opacity of the lateral left lower lobe (series 11, image 1). Coronary artery calcifications and/or stents. Hepatobiliary: No solid liver abnormality is [name]. Cholelithiasis. No gallbladder wall thickening, or biliary dilatation. Pancreas: Unremarkable. No pancreatic ductal dilatation or surrounding inflammatory changes. Spleen: Normal in size without significant abnormality. Adrenals/Urinary Tract: Adrenal glands are unremarkable. 9 mm calculus in the right renal pelvis (series 5, image 95). Additional punctuate calculus of the superior pole of the left kidney. The distal right ureter is poorly opacified, within this limitation, no evidence of urinary tract filling defect  on delayed phase imaging. Nonenhancing cyst of the superior pole of the left kidney. Thickening of the urinary bladder. Small diverticulum of the anterior right aspect of the bladder. Stomach/Bowel: Stomach is within normal limits. Appendix is not clearly visualized. No evidence of bowel wall thickening, distention, or inflammatory changes. Vascular/Lymphatic: Aortic atherosclerosis. No enlarged abdominal or pelvic lymph nodes. Reproductive: Mild prostatomegaly. Penile implant with reservoir in the low midline abdomen. Other: No abdominal wall hernia or abnormality. No abdominopelvic ascites. Musculoskeletal: No acute or significant osseous findings. IMPRESSION: 1. There is a 9 mm calculus in the right renal pelvis. Additional punctuate calculus of the superior pole of the left  kidney. No hydronephrosis. The distal right ureter is poorly opacified, within this limitation, no evidence of urinary tract filling defect on delayed phase imaging. 2. Thickening of the urinary bladder likely related to chronic outlet obstruction. 3. Mild prostatomegaly. 4. Rounded subpleural opacity of the dependent left lung base measuring 2.8 x 2.0 cm with a small associated left pleural effusion. There is adjacent bronchiectasis and swirling, bandlike scarring or atelectasis. These findings are highly characteristic of rounded atelectasis and stable compared to prior examination dated 11/22/2016, definitively benign. 5. Cholelithiasis. 6. Aortic Atherosclerosis (ICD10-I70.0). Electronically Signed   By: Eddie Candle M.D.   On: 12/31/2019 10:30    Assessment & Plan:   Problem List Items Addressed This Visit      Unprioritized   Depression with anxiety    Symptoms currently controlled with 30 mg cymbalta.        Diabetes mellitus without complication ([place]) - Primary    Currently well-controlled on current medications .  hemoglobin A1c is at goal of less than 7.0 . Patient is reminded to schedule an annual eye exam and foot exam is normal today. Patient has no microalbuminuria. Patient is tolerating statin therapy for CAD risk reduction and on ACE/ARB for renal protection and hypertension   Lab Results  Component Value Date   HGBA1C 6.5 (A) 01/07/2020   Lab Results  Component Value Date   LABMICR See below: 01/02/2020   MICROALBUR 1.0 06/15/2019   MICROALBUR 1.3 04/03/2018           Relevant Orders   POCT HgB A1C (Completed)   Hyperlipidemia LDL goal <70    Well controlled on 80  Mg Lipitor. .   Liver enzymes are normal , no changes today.  Lab Results  Component Value Date   CHOL 158 06/15/2019   HDL 48.20 06/15/2019   LDLCALC 97 06/15/2019   LDLDIRECT 193.0 05/18/2017   TRIG 62.0 06/15/2019   CHOLHDL 3 06/15/2019   Lab Results  Component Value Date   ALT 21  12/19/2019   AST 21 12/19/2019   ALKPHOS 63 12/19/2019   BILITOT 0.9 12/19/2019             Hypertension    Well controlled on current regimen. Renal function stable, no changes today.  Lab Results  Component Value Date   CREATININE 0.97 12/19/2019   Lab Results  Component Value Date   NA 138 12/19/2019   K 4.3 12/19/2019   CL 105 12/19/2019   CO2 27 12/19/2019            I provided  30 minutes of  face-to-face time during this encounter reviewing patient's current problems and past surgeries, labs and imaging studies, providing counseling on the above mentioned problems , and coordination  of care . I am having Augie Lavalais maintain his glucose monitoring  kit, freestyle, FREESTYLE LITE, ezetimibe, telmisartan, atorvastatin, DULoxetine, glipiZIDE, levothyroxine, and tamsulosin.  No orders of the defined types were placed in this encounter.   There are no discontinued medications.  Follow-up: Return in about 6 months (around 07/09/2020).   Crecencio Mc, MD

## 2020-01-07 NOTE — Assessment & Plan Note (Signed)
Symptoms currently controlled with 30 mg cymbalta.

## 2020-01-07 NOTE — Assessment & Plan Note (Addendum)
Currently well-controlled on current medications .  hemoglobin A1c is at goal of less than 7.0 . Patient is reminded to schedule an annual eye exam and foot exam is normal today. Patient has no microalbuminuria. Patient is tolerating statin therapy for CAD risk reduction and on ACE/ARB for renal protection and hypertension   Lab Results  Component Value Date   HGBA1C 6.5 (A) 01/07/2020   Lab Results  Component Value Date   LABMICR See below: 01/02/2020   MICROALBUR 1.0 06/15/2019   MICROALBUR 1.3 04/03/2018

## 2020-01-07 NOTE — Patient Instructions (Signed)
Your diabetes remains under excellent control currently. .Please return in 6 months for follow up on diabetes and make sure you are seeing your eye doctor at least once a year.   May the [place] the Psychologist, sport and exercise, anesthesiologists,  Nurses and techs, that your surgery is a success and that you recover fully!  In the name of Jesus Christ we pray

## 2020-01-08 NOTE — Assessment & Plan Note (Signed)
Well controlled on current regimen. Renal function stable, no changes today.  Lab Results  Component Value Date   CREATININE 0.97 12/19/2019   Lab Results  Component Value Date   NA 138 12/19/2019   K 4.3 12/19/2019   CL 105 12/19/2019   CO2 27 12/19/2019

## 2020-01-08 NOTE — Assessment & Plan Note (Signed)
Well controlled on 80  Mg Lipitor. .   Liver enzymes are normal , no changes today.  Lab Results  Component Value Date   CHOL 158 06/15/2019   HDL 48.20 06/15/2019   LDLCALC 97 06/15/2019   LDLDIRECT 193.0 05/18/2017   TRIG 62.0 06/15/2019   CHOLHDL 3 06/15/2019   Lab Results  Component Value Date   ALT 21 12/19/2019   AST 21 12/19/2019   ALKPHOS 63 12/19/2019   BILITOT 0.9 12/19/2019

## 2020-01-10 ENCOUNTER — Encounter
Admission: RE | Admit: 2020-01-10 | Discharge: 2020-01-10 | Disposition: A | Discharge status: Home / Self Care | Payer: PPO | Source: Ambulatory Visit | Attending: Urology | Admitting: Urology

## 2020-01-10 ENCOUNTER — Other Ambulatory Visit: Payer: Self-pay

## 2020-01-10 NOTE — Patient Instructions (Signed)
COVID TESTING Date: January 16, 2020 Testing site:  [facility] [address] Thru Hours:  6:31 am - 1:00 pm Once you are tested, you are asked to stay quarantined (avoiding public places) until after your surgery.   Your procedure is scheduled on: Friday January 16, 2020 Report to Day Surgery on the 2nd floor of the [facility].  CALL [phone] between 1pm and 3pm on January 15, 2020 Thursday to find out your arrival time.  REMEMBER: Instructions that are not followed completely may result in serious medical risk, up to and including death; or upon the discretion of your surgeon and anesthesiologist your surgery may need to be rescheduled.  Do not eat food after midnight the night before surgery.  No gum chewing, lozengers or hard candies.  You may however, drink CLEAR liquids up to 2 hours before you are scheduled to arrive for your surgery. Do not drink anything within 2 hours of your scheduled arrival time.  Clear liquids include: - water  - apple juice without pulp - gatorade (not RED) - black coffee or tea (Do NOT add milk or creamers to the coffee or tea) Do NOT drink anything that is not on this list.  Type 1 and Type 2 diabetics should only drink water.   TAKE THESE MEDICATIONS THE MORNING OF SURGERY WITH A SIP OF WATER: ATORVASTATIN LEVOTHYROXINE    Stop Anti-inflammatories (NSAIDS) such as Advil, Aleve, Ibuprofen, Motrin, Naproxen, Naprosyn and ASPIRIN OR Aspirin based products such as Excedrin, Goodys Powder, BC Powder. (May take Tylenol or Acetaminophen if needed.)  Stop ANY OVER THE COUNTER supplements until after surgery. (May continue Vitamin D, Vitamin B, and multivitamin.)  No Alcohol for 24 hours before or after surgery.  No Smoking including e-cigarettes for 24 hours prior to surgery.  No chewable tobacco products for at least 6 hours prior to surgery.  No nicotine patches on the day of surgery.  Do not use any  "recreational" drugs for at least a week prior to your surgery.  Please be advised that the combination of cocaine and anesthesia may have negative outcomes, up to and including death. If you test positive for cocaine, your surgery will be cancelled.  On the morning of surgery brush your teeth with toothpaste and water, you may rinse your mouth with mouthwash if you wish. Do not swallow any toothpaste or mouthwash.  Do not wear jewelry, make-up, hairpins, clips or nail polish.  Do not wear lotions, powders, or perfumes.   Do not shave 48 hours prior to surgery.   Contact lenses, hearing aids and dentures may not be worn into surgery.  Do not bring valuables to the hospital. [facility] is not responsible for any missing/lost belongings or valuables.   SHOWER MORNING OF SURGERY.   Notify your doctor if there is any change in your medical condition (cold, fever, infection).  Wear comfortable clothing (specific to your surgery type) to the hospital.  Plan for stool softeners for home use; pain medications have a tendency to cause constipation. You can also help prevent constipation by eating foods high in fiber such as fruits and vegetables and drinking plenty of fluids as your diet allows.  After surgery, you can help prevent lung complications by doing breathing exercises.  Take deep breaths and cough every 1-2 hours. Your doctor may order a device called an Incentive Spirometer to help you take deep breaths. When coughing or sneezing, hold a pillow firmly against your incision with  both hands. This is called "splinting." Doing this helps protect your incision. It also decreases belly discomfort.  If you are being discharged the day of surgery, you will not be allowed to drive home. You will need a responsible adult (18 years or older) to drive you home and stay with you that night.    Please call the [facility] Dept. at [phone] if you have any questions about  these instructions.  Visitation Policy:  Patients undergoing a surgery or procedure may have one family member or support person with them as long as that person is not COVID-19 positive or experiencing its symptoms.  That person may remain in the waiting area during the procedure.  Children under 40 years of age may have both parents or legal guardians with them during their procedure.  Inpatient Visitation Update:   Two designated support people may visit a patient during visiting hours 7 am to 8 pm. It must be the same two designated people for the duration of the patient stay. The visitors may come and go during the day, and there is no switching out to have different visitors. A mask must be worn at all times, including in the patient room.  Children under 59 years of age:  a total of 4 designated visitors for the child's entire stay are allowed. Only 2 in the room at a time and only one staying overnight at a time. The overnight guest can now rotate during the child's hospital stay.  As a reminder, masks are still required for all [facility] team members, patients and visitors in all [facility] facilities.   Systemwide, no visitors 17 or younger.

## 2020-01-16 ENCOUNTER — Other Ambulatory Visit
Admission: RE | Admit: 2020-01-16 | Discharge: 2020-01-16 | Disposition: A | Discharge status: Home / Self Care | Payer: PPO | Source: Ambulatory Visit | Attending: Urology | Admitting: Urology

## 2020-01-16 ENCOUNTER — Other Ambulatory Visit: Payer: Self-pay

## 2020-01-16 DIAGNOSIS — Z20822 Contact with and (suspected) exposure to covid-19: Secondary | ICD-10-CM | POA: Insufficient documentation

## 2020-01-16 DIAGNOSIS — Z01812 Encounter for preprocedural laboratory examination: Secondary | ICD-10-CM | POA: Diagnosis not present

## 2020-01-16 LAB — SARS CORONAVIRUS 2 (TAT 6-24 HRS): SARS Coronavirus 2: NEGATIVE

## 2020-01-18 ENCOUNTER — Ambulatory Visit: Payer: PPO | Admitting: Certified Registered Nurse Anesthetist

## 2020-01-18 ENCOUNTER — Ambulatory Visit
Admission: RE | Admit: 2020-01-18 | Discharge: 2020-01-18 | Disposition: A | Discharge status: Home / Self Care | Payer: PPO | Attending: Urology | Admitting: Urology

## 2020-01-18 ENCOUNTER — Ambulatory Visit: Payer: PPO

## 2020-01-18 ENCOUNTER — Encounter: Payer: Self-pay | Admitting: Urology

## 2020-01-18 ENCOUNTER — Other Ambulatory Visit: Payer: Self-pay

## 2020-01-18 ENCOUNTER — Encounter
Admission: RE | Disposition: A | Discharge status: Home / Self Care | Payer: Self-pay | Source: Home / Self Care | Attending: Urology

## 2020-01-18 DIAGNOSIS — E119 Type 2 diabetes mellitus without complications: Secondary | ICD-10-CM | POA: Insufficient documentation

## 2020-01-18 DIAGNOSIS — Z87891 Personal history of nicotine dependence: Secondary | ICD-10-CM | POA: Diagnosis not present

## 2020-01-18 DIAGNOSIS — I2510 Atherosclerotic heart disease of native coronary artery without angina pectoris: Secondary | ICD-10-CM | POA: Insufficient documentation

## 2020-01-18 DIAGNOSIS — I10 Essential (primary) hypertension: Secondary | ICD-10-CM | POA: Diagnosis not present

## 2020-01-18 DIAGNOSIS — N486 Induration penis plastica: Secondary | ICD-10-CM | POA: Diagnosis not present

## 2020-01-18 DIAGNOSIS — N200 Calculus of kidney: Secondary | ICD-10-CM | POA: Diagnosis not present

## 2020-01-18 DIAGNOSIS — Z9689 Presence of other specified functional implants: Secondary | ICD-10-CM | POA: Insufficient documentation

## 2020-01-18 DIAGNOSIS — Z951 Presence of aortocoronary bypass graft: Secondary | ICD-10-CM | POA: Diagnosis not present

## 2020-01-18 DIAGNOSIS — E785 Hyperlipidemia, unspecified: Secondary | ICD-10-CM | POA: Diagnosis not present

## 2020-01-18 DIAGNOSIS — E039 Hypothyroidism, unspecified: Secondary | ICD-10-CM | POA: Diagnosis not present

## 2020-01-18 DIAGNOSIS — I252 Old myocardial infarction: Secondary | ICD-10-CM | POA: Diagnosis not present

## 2020-01-18 DIAGNOSIS — F418 Other specified anxiety disorders: Secondary | ICD-10-CM | POA: Diagnosis not present

## 2020-01-18 HISTORY — PX: CYSTOSCOPY W/ RETROGRADES: SHX1426

## 2020-01-18 HISTORY — PX: CYSTOSCOPY/URETEROSCOPY/HOLMIUM LASER/STENT PLACEMENT: SHX6546

## 2020-01-18 LAB — GLUCOSE, CAPILLARY
Glucose-Capillary: 153 mg/dL — ABNORMAL HIGH (ref 70–99)
Glucose-Capillary: 116 mg/dL — ABNORMAL HIGH (ref 70–99)

## 2020-01-18 SURGERY — CYSTOSCOPY/URETEROSCOPY/HOLMIUM LASER/STENT PLACEMENT
Anesthesia: General | Laterality: Right

## 2020-01-18 MED ORDER — PHENYLEPHRINE HCL (PRESSORS) 10 MG/ML IV SOLN
INTRAVENOUS | Status: DC | PRN
  Administered 2020-01-18 (×5): 200 ug via INTRAVENOUS
  Administered 2020-01-18: 100 ug via INTRAVENOUS
  Administered 2020-01-18: 200 ug via INTRAVENOUS

## 2020-01-18 MED ORDER — FAMOTIDINE 20 MG PO TABS
ORAL_TABLET | ORAL | Status: AC
  Administered 2020-01-18: 20 mg via ORAL
  Filled 2020-01-18: qty 1

## 2020-01-18 MED ORDER — FENTANYL CITRATE (PF) 100 MCG/2ML IJ SOLN: 25.0000 ug | INTRAMUSCULAR | Status: DC | PRN

## 2020-01-18 MED ORDER — KETOROLAC TROMETHAMINE 30 MG/ML IJ SOLN
INTRAMUSCULAR | Status: DC | PRN
  Administered 2020-01-18: 15 mg via INTRAVENOUS

## 2020-01-18 MED ORDER — GLYCOPYRROLATE 0.2 MG/ML IJ SOLN
INTRAMUSCULAR | Status: DC | PRN
  Administered 2020-01-18: .2 mg via INTRAVENOUS

## 2020-01-18 MED ORDER — FAMOTIDINE 20 MG PO TABS: 20.0000 mg | ORAL_TABLET | Freq: Once | ORAL | Status: AC

## 2020-01-18 MED ORDER — PROPOFOL 10 MG/ML IV BOLUS
INTRAVENOUS | Status: DC | PRN
  Administered 2020-01-18: 100 mg via INTRAVENOUS

## 2020-01-18 MED ORDER — FENTANYL CITRATE (PF) 100 MCG/2ML IJ SOLN
INTRAMUSCULAR | Status: DC | PRN
  Administered 2020-01-18: 50 ug via INTRAVENOUS

## 2020-01-18 MED ORDER — SUGAMMADEX SODIUM 200 MG/2ML IV SOLN
INTRAVENOUS | Status: DC | PRN
  Administered 2020-01-18: 200 mg via INTRAVENOUS

## 2020-01-18 MED ORDER — CHLORHEXIDINE GLUCONATE 0.12 % MT SOLN
OROMUCOSAL | Status: AC
  Administered 2020-01-18: 15 mL via OROMUCOSAL
  Filled 2020-01-18: qty 15

## 2020-01-18 MED ORDER — EPHEDRINE SULFATE 50 MG/ML IJ SOLN
INTRAMUSCULAR | Status: DC | PRN
  Administered 2020-01-18 (×3): 10 mg via INTRAVENOUS

## 2020-01-18 MED ORDER — PROPOFOL 10 MG/ML IV BOLUS
INTRAVENOUS | Status: AC
  Filled 2020-01-18: qty 80

## 2020-01-18 MED ORDER — GLYCOPYRROLATE 0.2 MG/ML IJ SOLN
INTRAMUSCULAR | Status: AC
  Filled 2020-01-18: qty 1

## 2020-01-18 MED ORDER — ONDANSETRON HCL 4 MG/2ML IJ SOLN
INTRAMUSCULAR | Status: AC
  Filled 2020-01-18: qty 2

## 2020-01-18 MED ORDER — BELLADONNA ALKALOIDS-OPIUM 16.2-60 MG RE SUPP
RECTAL | Status: AC
  Filled 2020-01-18: qty 1

## 2020-01-18 MED ORDER — CEFAZOLIN SODIUM-DEXTROSE 2-4 GM/100ML-% IV SOLN
2.0000 g | INTRAVENOUS | Status: AC
  Administered 2020-01-18: 2 g via INTRAVENOUS

## 2020-01-18 MED ORDER — ROCURONIUM BROMIDE 100 MG/10ML IV SOLN
INTRAVENOUS | Status: DC | PRN
  Administered 2020-01-18: 50 mg via INTRAVENOUS

## 2020-01-18 MED ORDER — LIDOCAINE HCL (PF) 2 % IJ SOLN
INTRAMUSCULAR | Status: AC
  Filled 2020-01-18: qty 5

## 2020-01-18 MED ORDER — SODIUM CHLORIDE 0.9 % IV SOLN: INTRAVENOUS | Status: DC

## 2020-01-18 MED ORDER — CEFAZOLIN SODIUM-DEXTROSE 2-4 GM/100ML-% IV SOLN
INTRAVENOUS | Status: AC
  Filled 2020-01-18: qty 100

## 2020-01-18 MED ORDER — FENTANYL CITRATE (PF) 100 MCG/2ML IJ SOLN
INTRAMUSCULAR | Status: AC
  Filled 2020-01-18: qty 2

## 2020-01-18 MED ORDER — DEXAMETHASONE SODIUM PHOSPHATE 10 MG/ML IJ SOLN
INTRAMUSCULAR | Status: DC | PRN
  Administered 2020-01-18: 5 mg via INTRAVENOUS

## 2020-01-18 MED ORDER — IOHEXOL 180 MG/ML  SOLN
INTRAMUSCULAR | Status: DC | PRN
  Administered 2020-01-18: 15 mL

## 2020-01-18 MED ORDER — BELLADONNA ALKALOIDS-OPIUM 16.2-60 MG RE SUPP
RECTAL | Status: DC | PRN
  Administered 2020-01-18: 1 via RECTAL

## 2020-01-18 MED ORDER — EPHEDRINE 5 MG/ML INJ
INTRAVENOUS | Status: AC
  Filled 2020-01-18: qty 10

## 2020-01-18 MED ORDER — ORAL CARE MOUTH RINSE: 15.0000 mL | Freq: Once | OROMUCOSAL | Status: AC

## 2020-01-18 MED ORDER — ONDANSETRON HCL 4 MG/2ML IJ SOLN: 4.0000 mg | Freq: Once | INTRAMUSCULAR | Status: DC | PRN

## 2020-01-18 MED ORDER — ROCURONIUM BROMIDE 10 MG/ML (PF) SYRINGE
PREFILLED_SYRINGE | INTRAVENOUS | Status: AC
  Filled 2020-01-18: qty 10

## 2020-01-18 MED ORDER — KETOROLAC TROMETHAMINE 30 MG/ML IJ SOLN
INTRAMUSCULAR | Status: AC
  Filled 2020-01-18: qty 1

## 2020-01-18 MED ORDER — LIDOCAINE HCL (CARDIAC) PF 100 MG/5ML IV SOSY
PREFILLED_SYRINGE | INTRAVENOUS | Status: DC | PRN
  Administered 2020-01-18: 80 mg via INTRAVENOUS

## 2020-01-18 MED ORDER — SULFAMETHOXAZOLE-TRIMETHOPRIM 400-80 MG PO TABS: 1.0000 | ORAL_TABLET | Freq: Every day | ORAL | 0 refills | Status: DC

## 2020-01-18 MED ORDER — HYDROCODONE-ACETAMINOPHEN 5-325 MG PO TABS: 1.0000 | ORAL_TABLET | ORAL | 0 refills | Status: AC | PRN

## 2020-01-18 MED ORDER — DEXAMETHASONE SODIUM PHOSPHATE 10 MG/ML IJ SOLN
INTRAMUSCULAR | Status: AC
  Filled 2020-01-18: qty 1

## 2020-01-18 MED ORDER — CHLORHEXIDINE GLUCONATE 0.12 % MT SOLN: 15.0000 mL | Freq: Once | OROMUCOSAL | Status: AC

## 2020-01-18 MED ORDER — ACETAMINOPHEN 10 MG/ML IV SOLN
INTRAVENOUS | Status: DC | PRN
  Administered 2020-01-18: 1000 mg via INTRAVENOUS

## 2020-01-18 MED ORDER — ACETAMINOPHEN 10 MG/ML IV SOLN
INTRAVENOUS | Status: AC
  Filled 2020-01-18: qty 100

## 2020-01-18 SURGICAL SUPPLY — 33 items
BAG DRAIN CYSTO-URO LG1000N (MISCELLANEOUS) ×4 IMPLANT
BRUSH SCRUB EZ 1% IODOPHOR (MISCELLANEOUS) ×4 IMPLANT
CATH URETL 5X70 OPEN END (CATHETERS) ×2 IMPLANT
CNTNR SPEC 2.5X3XGRAD LEK (MISCELLANEOUS)
CONT SPEC 4OZ STER OR WHT (MISCELLANEOUS)
CONT SPEC 4OZ STRL OR WHT (MISCELLANEOUS)
CONTAINER SPEC 2.5X3XGRAD LEK (MISCELLANEOUS) IMPLANT
DRAPE UTILITY 15X26 TOWEL STRL (DRAPES) ×4 IMPLANT
FIBER LASER TRAC TIP (UROLOGICAL SUPPLIES) ×2 IMPLANT
GLOVE BIOGEL PI IND STRL 7.5 (GLOVE) ×2 IMPLANT
GLOVE BIOGEL PI INDICATOR 7.5 (GLOVE) ×2
GOWN STRL REUS W/ TWL LRG LVL3 (GOWN DISPOSABLE) ×2 IMPLANT
GOWN STRL REUS W/ TWL XL LVL3 (GOWN DISPOSABLE) ×2 IMPLANT
GOWN STRL REUS W/TWL LRG LVL3 (GOWN DISPOSABLE) ×4
GOWN STRL REUS W/TWL XL LVL3 (GOWN DISPOSABLE) ×4
GUIDEWIRE GREEN .038 145CM (MISCELLANEOUS) ×2 IMPLANT
GUIDEWIRE STR DUAL SENSOR (WIRE) ×4 IMPLANT
INFUSOR MANOMETER BAG 3000ML (MISCELLANEOUS) ×4 IMPLANT
INTRODUCER DILATOR DOUBLE (INTRODUCER) ×2 IMPLANT
KIT TURNOVER CYSTO (KITS) ×4 IMPLANT
PACK CYSTO AR (MISCELLANEOUS) ×4 IMPLANT
SET CYSTO W/LG BORE CLAMP LF (SET/KITS/TRAYS/PACK) ×4 IMPLANT
SHEATH URETERAL 12FRX35CM (MISCELLANEOUS) IMPLANT
SOL .9 NS 3000ML IRR  AL (IV SOLUTION) ×4
SOL .9 NS 3000ML IRR AL (IV SOLUTION) ×2
SOL .9 NS 3000ML IRR UROMATIC (IV SOLUTION) ×2 IMPLANT
STENT URET 6FRX24 CONTOUR (STENTS) IMPLANT
STENT URET 6FRX26 CONTOUR (STENTS) IMPLANT
STENT URET 6FRX28 CONTOUR (STENTS) ×2 IMPLANT
SURGILUBE 2OZ TUBE FLIPTOP (MISCELLANEOUS) ×4 IMPLANT
SYR 10ML LL (SYRINGE) ×6 IMPLANT
VALVE UROSEAL ADJ ENDO (VALVE) ×2 IMPLANT
WATER STERILE IRR 1000ML POUR (IV SOLUTION) ×4 IMPLANT

## 2020-01-18 NOTE — Op Note (Signed)
Date of procedure: 01/18/20  Preoperative diagnosis:  1. Right renal stone  Postoperative diagnosis:  1. Same  Procedure: 1. Cystoscopy, right retrograde pyelogram with intraoperative interpretation, right ureteral stent placement 2. Right ureteroscopy and laser lithotripsy  Surgeon: [name], MD  Anesthesia: General  Complications: None  Intraoperative findings:  1.  Subtle hypospadias, dilated to 28 French 2.  Moderate to severe bladder trabeculations, normal urethra 3.  Uncomplicated dusting of 9 mm right renal stone and stent placement  EBL: Minimal  Specimens: None  Drains: Right 6 French by 28 cm ureteral stent  Indication: Brett [name] is a 79 y.o. patient with gross hematuria and a 9 mm right renal pelvis stone who opted for definitive management with ureteroscopy..  After reviewing the management options for treatment, they elected to proceed with the above surgical procedure(s). We have discussed the potential benefits and risks of the procedure, side effects of the proposed treatment, the likelihood of the patient achieving the goals of the procedure, and any potential problems that might occur during the procedure or recuperation. Informed consent has been obtained.  Description of procedure:  The patient was taken to the operating room and general anesthesia was induced. SCDs were placed for DVT prophylaxis. The patient was placed in the dorsal lithotomy position, prepped and draped in the usual sterile fashion, and preoperative antibiotics(Ancef) were administered. A preoperative time-out was performed.   On exam, there was a subtle hypospadias on the glans and it appeared quite tight.  Using the [employer] sounds this was dilated up to 28 [place].  A 21 French rigid cystoscope was advanced into the urethra and a normal-appearing urethra was followed proximally into the bladder.  There was no evidence of erosion of his IPP.  The prostate was moderate in size.  There  were moderate to severe bladder trabeculations.  Thorough cystoscopy was performed and showed no suspicious bladder lesions.  The ureteral orifices were orthotopic bilaterally.  I started by performing a retrograde pyelogram on the right side via an access catheter which showed some J hooking of the ureter around the prostate, but no other filling defects or hydronephrosis.  A sensor wire was then advanced easily up into the kidney.  I attempted to pass the single channel flexible ureteroscope but met resistance.  A dual-lumen access catheter was used to gently dilate the ureter and had a second Super Stiff wire.  The scope and passed easily over the Super Stiff wire up into the kidney.  Thorough pyeloscopy revealed a 9 mm black stone that appeared to be calcium oxalate in the renal pelvis.  Using a 200 m laser fiber on settings of 0.5 J and 40 Hz, the stone was fragmented to dust.  There were no significant residual fragments.  Careful pullback ureteroscopy demonstrated no ureteral injury or residual fragments.  The rigid cystoscope was backloaded over the wire, and a 6 [place] by 28 cm ureteral stent was uneventfully placed with an excellent curl in the upper pole, as well as under direct vision in the bladder.  A belladonna suppository was placed.  Disposition: Stable to PACU  Plan: Follow-up in 1 week for stent removal  [name], MD

## 2020-01-18 NOTE — Interval H&P Note (Signed)
UROLOGY H&P UPDATE  Agree with prior H&P dated 01/02/20.  Gross hematuria with CT urogram showing a 9 mm UPJ stone with no hydronephrosis, no other urologic abnormalities.  Here today for diagnostic cystoscopy, right ureteroscopy, laser lithotripsy, stent placement.  Cardiac: RRR Lungs: CTA bilaterally  Laterality: Right Procedure: Cystoscopy, right ureteroscopy, laser lithotripsy, stent placement  Urine: Urine culture 7/14 no growth, urinalysis negative 7/28  We specifically discussed the risks ureteroscopy including bleeding, infection/sepsis, stent related symptoms including flank pain/urgency/frequency/incontinence/dysuria, ureteral injury, inability to access stone, or need for staged or additional procedures.   Billey Co, MD 01/18/2020

## 2020-01-18 NOTE — Anesthesia Procedure Notes (Signed)
Procedure Name: Intubation Date/Time: 01/18/2020 11:11 AM Performed by: Lowry Bowl, CRNA Pre-anesthesia Checklist: Patient identified, Emergency Drugs available, Suction available and Patient being monitored Patient Re-evaluated:Patient Re-evaluated prior to induction Oxygen Delivery Method: Circle system utilized Preoxygenation: Pre-oxygenation with 100% oxygen Induction Type: IV induction and Cricoid Pressure applied Ventilation: Mask ventilation without difficulty Laryngoscope Size: McGraph and 4 Grade View: Grade I Tube type: Oral Tube size: 7.5 mm Number of attempts: 1 Airway Equipment and Method: Stylet and Video-laryngoscopy Placement Confirmation: ETT inserted through vocal cords under direct vision,  positive ETCO2 and breath sounds checked- equal and bilateral Secured at: 22 cm Tube secured with: Tape Dental Injury: Teeth and Oropharynx as per pre-operative assessment  Difficulty Due To: Difficulty was anticipated and Difficult Airway- due to anterior larynx

## 2020-01-18 NOTE — Anesthesia Preprocedure Evaluation (Signed)
Anesthesia Evaluation  Patient identified by MRN, date of birth, ID band Patient awake    Reviewed: Allergy & Precautions, H&P , NPO status , Patient's Chart, lab work & pertinent test results, reviewed documented beta blocker date and time   Airway Mallampati: II  TM Distance: >3 FB Neck ROM: full    Dental  (+) Teeth Intact   Pulmonary neg pulmonary ROS, former smoker,    Pulmonary exam normal        Cardiovascular hypertension, On Medications + CAD and + Past MI  Normal cardiovascular exam Rhythm:regular Rate:Normal     Neuro/Psych PSYCHIATRIC DISORDERS Anxiety Depression negative neurological ROS     GI/Hepatic negative GI ROS, Neg liver ROS,   Endo/Other  diabetes, Well Controlled, Type 2, Oral Hypoglycemic AgentsHypothyroidism   Renal/GU negative Renal ROS  negative genitourinary   Musculoskeletal   Abdominal   Peds  Hematology negative hematology ROS (+)   Anesthesia Other Findings Past Medical History: No date: Allergy     Comment:  resolved after CABG No date: Calcific Achilles tendonitis No date: Calculus of ureter No date: Coronary artery disease No date: Diabetes mellitus 2012: H/O renal calculi No date: Hematuria, unspecified No date: Hyperlipidemia No date: Hypertension 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 No date: Myocardial infarction ([place]) No date: Peyronie's disease 2012: Pulmonary nodule seen on imaging study No date: Sleep difficulties No date: Swelling, mass, or lump in chest No date: Unspecified hypothyroidism Past Surgical History: 2001: CARDIAC CATHETERIZATION     Comment:  [facility] x3 stent No date: COLONOSCOPY 01/24/2017: COLONOSCOPY WITH PROPOFOL; N/A     Comment:  Procedure: COLONOSCOPY WITH PROPOFOL;  Surgeon: Manya Silvas, MD;  Location: [facility] ENDOSCOPY;  Service:               Endoscopy;  Laterality: N/A; 2006: CORONARY ARTERY BYPASS GRAFT      Comment:  4 vessel, after 3 or 4 stents  No date: CORONARY ARTERY BYPASS GRAFT No date: OSTECTOMY CALCANEUS; Left No date: PENILE PROSTHESIS IMPLANT   Reproductive/Obstetrics negative OB ROS                             Anesthesia Physical Anesthesia Plan  ASA: III  Anesthesia Plan: General ETT   Post-op Pain Management:    Induction:   PONV Risk Score and Plan: 3  Airway Management Planned:   Additional Equipment:   Intra-op Plan:   Post-operative Plan:   Informed Consent: I have reviewed the patients History and Physical, chart, labs and discussed the procedure including the risks, benefits and alternatives for the proposed anesthesia with the patient or authorized representative who has indicated his/her understanding and acceptance.     Dental Advisory Given  Plan Discussed with: CRNA  Anesthesia Plan Comments:         Anesthesia Quick Evaluation

## 2020-01-18 NOTE — Transfer of Care (Signed)
Immediate Anesthesia Transfer of Care Note  Patient: Brett [name]  Procedure(s) Performed: CYSTOSCOPY/URETEROSCOPY/HOLMIUM LASER/STENT PLACEMENT (Right )  Patient Location: PACU  Anesthesia Type:General  Level of Consciousness: drowsy  Airway & Oxygen Therapy: Patient Spontanous Breathing and Patient connected to face mask oxygen  Post-op Assessment: Report given to RN and Post -op Vital signs reviewed and stable  Post vital signs: Reviewed and stable  Last Vitals:  Vitals Value Taken Time  BP 113/50 01/18/20 1203  Temp 36.7 C 01/18/20 1202  Pulse 58 01/18/20 1205  Resp 16 01/18/20 1205  SpO2 99 % 01/18/20 1205  Vitals shown include unvalidated device data.  Last Pain:  Vitals:   01/18/20 1202  TempSrc:   PainSc: 0-No pain         Complications: No complications documented.

## 2020-01-18 NOTE — Discharge Instructions (Signed)

## 2020-01-21 ENCOUNTER — Encounter: Payer: Self-pay | Admitting: Urology

## 2020-01-24 NOTE — Anesthesia Postprocedure Evaluation (Signed)
Anesthesia Post Note  Patient: Brett [name]  Procedure(s) Performed: CYSTOSCOPY/URETEROSCOPY/HOLMIUM LASER/STENT PLACEMENT (Right ) CYSTOSCOPY WITH RETROGRADE PYELOGRAM  Patient location during evaluation: PACU Anesthesia Type: General Level of consciousness: awake and alert Pain management: pain level controlled Vital Signs Assessment: post-procedure vital signs reviewed and stable Respiratory status: spontaneous breathing, nonlabored ventilation, respiratory function stable and patient connected to nasal cannula oxygen Cardiovascular status: blood pressure returned to baseline and stable Postop Assessment: no apparent nausea or vomiting Anesthetic complications: no   No complications documented.   Last Vitals:  Vitals:   01/18/20 1238 01/18/20 1251  BP: 105/67 104/67  Pulse: (!) 58 (!) 57  Resp: 13 14  Temp: (!) 36.2 C (!) 36.2 C  SpO2: 95% 95%    Last Pain:  Vitals:   01/22/20 1053  TempSrc:   PainSc: 0-No pain                 Molli Barrows

## 2020-01-30 ENCOUNTER — Other Ambulatory Visit: Payer: Self-pay

## 2020-01-30 ENCOUNTER — Encounter: Payer: Self-pay | Admitting: Urology

## 2020-01-30 ENCOUNTER — Ambulatory Visit (INDEPENDENT_AMBULATORY_CARE_PROVIDER_SITE_OTHER): Payer: PPO | Admitting: Urology

## 2020-01-30 VITALS — BP 102/61 | HR 60 | Ht 71.0 in | Wt 184.0 lb

## 2020-01-30 DIAGNOSIS — M779 Enthesopathy, unspecified: Secondary | ICD-10-CM | POA: Insufficient documentation

## 2020-01-30 DIAGNOSIS — N200 Calculus of kidney: Secondary | ICD-10-CM

## 2020-01-30 DIAGNOSIS — G479 Sleep disorder, unspecified: Secondary | ICD-10-CM | POA: Insufficient documentation

## 2020-01-30 DIAGNOSIS — Z466 Encounter for fitting and adjustment of urinary device: Secondary | ICD-10-CM | POA: Diagnosis not present

## 2020-01-30 MED ORDER — LIDOCAINE HCL URETHRAL/MUCOSAL 2 % EX GEL
1.0000 "application " | Freq: Once | CUTANEOUS | Status: AC
  Administered 2020-01-30: 1 via URETHRAL

## 2020-01-30 NOTE — Patient Instructions (Addendum)
Ureteral Stent Implantation, Care After This sheet gives you information about how to care for yourself after your procedure. Your health care provider may also give you more specific instructions. If you have problems or questions, contact your health care provider. What can I expect after the procedure? After the procedure, it is common to have:  Nausea.  Mild pain when you urinate. You may feel this pain in your lower back or lower abdomen. The pain should stop within a few minutes after you urinate. This may last for up to 1 week.  A small amount of blood in your urine for several days. Follow these instructions at home: Medicines  Take over-the-counter and prescription medicines only as told by your health care provider.  If you were prescribed an antibiotic medicine, take it as told by your health care provider. Do not stop taking the antibiotic even if you start to feel better.  Do not drive for 24 hours if you were given a sedative during your procedure.  Ask your health care provider if the medicine prescribed to you requires you to avoid driving or using heavy machinery. Activity  Rest as told by your health care provider.  Avoid sitting for a long time without moving. Get up to take short walks every 1-2 hours. This is important to improve blood flow and breathing. Ask for help if you feel weak or unsteady.  Return to your normal activities as told by your health care provider. Ask your health care provider what activities are safe for you. General instructions   Watch for any blood in your urine. Call your health care provider if the amount of blood in your urine increases.  If you have a catheter: ? Follow instructions from your health care provider about taking care of your catheter and collection bag. ? Do not take baths, swim, or use a hot tub until your health care provider approves. Ask your health care provider if you may take showers. You may only be allowed to  take sponge baths.  Drink enough fluid to keep your urine pale yellow.  Do not use any products that contain nicotine or tobacco, such as cigarettes, e-cigarettes, and chewing tobacco. These can delay healing after surgery. If you need help quitting, ask your health care provider.  Keep all follow-up visits as told by your health care provider. This is important. Contact a health care provider if:  You have pain that gets worse or does not get better with medicine, especially pain when you urinate.  You have difficulty urinating.  You feel nauseous or you vomit repeatedly during a period of more than 2 days after the procedure. Get help right away if:  Your urine is dark red or has blood clots in it.  You are leaking urine (have incontinence).  The end of the stent comes out of your urethra.  You cannot urinate.  You have sudden, sharp, or severe pain in your abdomen or lower back.  You have a fever.  You have swelling or pain in your legs.  You have difficulty breathing. Summary  After the procedure, it is common to have mild pain when you urinate that goes away within a few minutes after you urinate. This may last for up to 1 week.  Watch for any blood in your urine. Call your health care provider if the amount of blood in your urine increases.  Take over-the-counter and prescription medicines only as told by your health care provider.  Drink   enough fluid to keep your urine pale yellow. This information is not intended to replace advice given to you by your health care provider. Make sure you discuss any questions you have with your health care provider. Document Revised: 02/28/2018 Document Reviewed: 03/01/2018 Elsevier Patient Education  2020 [place].   Dietary Guidelines to Help Prevent Kidney Stones Kidney stones are deposits of minerals and salts that form inside your kidneys. Your risk of developing kidney stones may be greater depending on your diet, your  lifestyle, the medicines you take, and whether you have certain medical conditions. Most people can reduce their chances of developing kidney stones by following the instructions below. Depending on your overall health and the type of kidney stones you tend to develop, your dietitian may give you more specific instructions. What are tips for following this plan? Reading food labels  Choose foods with "no salt added" or "low-salt" labels. Limit your sodium intake to less than 1500 mg per day.  Choose foods with calcium for each meal and snack. Try to eat about 300 mg of calcium at each meal. Foods that contain 200-500 mg of calcium per serving include: ? 8 oz (237 ml) of milk, fortified nondairy milk, and fortified fruit juice. ? 8 oz (237 ml) of kefir, yogurt, and soy yogurt. ? 4 oz (118 ml) of tofu. ? 1 oz of cheese. ? 1 cup (300 g) of dried figs. ? 1 cup (91 g) of cooked broccoli. ? 1-3 oz can of sardines or mackerel.  Most people need 1000 to 1500 mg of calcium each day. Talk to your dietitian about how much calcium is recommended for you. Shopping  Buy plenty of fresh fruits and vegetables. Most people do not need to avoid fruits and vegetables, even if they contain nutrients that may contribute to kidney stones.  When shopping for convenience foods, choose: ? Whole pieces of fruit. ? Premade salads with dressing on the side. ? Low-fat fruit and yogurt smoothies.  Avoid buying frozen meals or prepared deli foods.  Look for foods with live cultures, such as yogurt and kefir. Cooking  Do not add salt to food when cooking. Place a salt shaker on the table and allow each person to add his or her own salt to taste.  Use vegetable protein, such as beans, textured vegetable protein (TVP), or tofu instead of meat in pasta, casseroles, and soups. Meal planning   Eat less salt, if told by your dietitian. To do this: ? Avoid eating processed or premade food. ? Avoid eating fast  food.  Eat less animal protein, including cheese, meat, poultry, or fish, if told by your dietitian. To do this: ? Limit the number of times you have meat, poultry, fish, or cheese each week. Eat a diet free of meat at least 2 days a week. ? Eat only one serving each day of meat, poultry, fish, or seafood. ? When you prepare animal protein, cut pieces into small portion sizes. For most meat and fish, one serving is about the size of one deck of cards.  Eat at least 5 servings of fresh fruits and vegetables each day. To do this: ? Keep fruits and vegetables on hand for snacks. ? Eat 1 piece of fruit or a handful of berries with breakfast. ? Have a salad and fruit at lunch. ? Have two kinds of vegetables at dinner.  Limit foods that are high in a substance called oxalate. These include: ? Spinach. ? Rhubarb. ? Beets. ?  Potato chips and french fries. ? Nuts.  If you regularly take a diuretic medicine, make sure to eat at least 1-2 fruits or vegetables high in potassium each day. These include: ? Avocado. ? Banana. ? Orange, prune, carrot, or tomato juice. ? Baked potato. ? Cabbage. ? Beans and split peas. General instructions   Drink enough fluid to keep your urine clear or pale yellow. This is the most important thing you can do.  Talk to your health care provider and dietitian about taking daily supplements. Depending on your health and the cause of your kidney stones, you may be advised: ? Not to take supplements with vitamin C. ? To take a calcium supplement. ? To take a daily probiotic supplement. ? To take other supplements such as magnesium, fish oil, or vitamin B6.  Take all medicines and supplements as told by your health care provider.  Limit alcohol intake to no more than 1 drink a day for nonpregnant women and 2 drinks a day for men. One drink equals 12 oz of beer, 5 oz of wine, or 1 oz of hard liquor.  Lose weight if told by your health care provider. Work with  your dietitian to find strategies and an eating plan that works best for you. What foods are not recommended? Limit your intake of the following foods, or as told by your dietitian. Talk to your dietitian about specific foods you should avoid based on the type of kidney stones and your overall health. Grains Breads. Bagels. Rolls. Baked goods. Salted crackers. Cereal. Pasta. Vegetables Spinach. Rhubarb. Beets. Canned vegetables. Angie Fava. Olives. Meats and other protein foods Nuts. Nut butters. Large portions of meat, poultry, or fish. Salted or cured meats. Deli meats. Hot dogs. Sausages. Dairy Cheese. Beverages Regular soft drinks. Regular vegetable juice. Seasonings and other foods Seasoning blends with salt. Salad dressings. Canned soups. Soy sauce. Ketchup. Barbecue sauce. Canned pasta sauce. Casseroles. Pizza. Lasagna. Frozen meals. Potato chips. [place] fries. Summary  You can reduce your risk of kidney stones by making changes to your diet.  The most important thing you can do is drink enough fluid. You should drink enough fluid to keep your urine clear or pale yellow.  Ask your health care provider or dietitian how much protein from animal sources you should eat each day, and also how much salt and calcium you should have each day. This information is not intended to replace advice given to you by your health care provider. Make sure you discuss any questions you have with your health care provider. Document Revised: 09/13/2018 Document Reviewed: 05/04/2016 Elsevier Patient Education  2020 [employer].

## 2020-01-30 NOTE — Progress Notes (Signed)
Cystoscopy Procedure Note:  Indication: Stent removal s/p 01/18/2020 right ureteroscopy/laser lithotripsy/stent for 9 mm renal pelvis stone  After informed consent and discussion of the procedure and its risks, Brett [name] was positioned and prepped in the standard fashion. Cystoscopy was performed with a flexible cystoscope. The stent was grasped with flexible graspers and removed in its entirety. The patient tolerated the procedure well.  Findings: Uncomplicated stent removal  Assessment and Plan: We discussed general stone prevention strategies including adequate hydration with goal of producing 2.5 L of urine daily, increasing citric acid intake, increasing calcium intake during high oxalate meals, minimizing animal protein, and decreasing salt intake. Information about dietary recommendations given today.   Follow up in 4 weeks with renal ultrasound to evaluate for silent hydronephrosis  Billey Co, MD 01/30/2020

## 2020-01-31 LAB — URINALYSIS, COMPLETE
Bilirubin, UA: NEGATIVE
Nitrite, UA: NEGATIVE
Specific Gravity, UA: >1.03 — ABNORMAL HIGH (ref 1.005–1.030)
Urobilinogen, Ur: 1 mg/dL (ref 0.2–1.0)
pH, UA: 5.5 (ref 5.0–7.5)

## 2020-01-31 LAB — MICROSCOPIC EXAMINATION
Bacteria, UA: NONE SEEN
RBC, Urine: >30 /hpf — AB (ref 0–2)

## 2020-02-06 ENCOUNTER — Other Ambulatory Visit: Payer: Self-pay | Admitting: Internal Medicine

## 2020-03-08 ENCOUNTER — Other Ambulatory Visit: Payer: Self-pay | Admitting: Internal Medicine

## 2020-03-12 ENCOUNTER — Ambulatory Visit
Admission: RE | Admit: 2020-03-12 | Discharge: 2020-03-12 | Disposition: A | Discharge status: Home / Self Care | Payer: PPO | Source: Ambulatory Visit | Attending: Urology | Admitting: Urology

## 2020-03-12 ENCOUNTER — Ambulatory Visit: Payer: PPO | Admitting: Urology

## 2020-03-12 ENCOUNTER — Other Ambulatory Visit: Payer: Self-pay

## 2020-03-12 ENCOUNTER — Encounter: Payer: Self-pay | Admitting: Urology

## 2020-03-12 VITALS — BP 107/65 | HR 63 | Ht 71.0 in | Wt 184.0 lb

## 2020-03-12 DIAGNOSIS — R310 Gross hematuria: Secondary | ICD-10-CM | POA: Diagnosis not present

## 2020-03-12 DIAGNOSIS — N200 Calculus of kidney: Secondary | ICD-10-CM

## 2020-03-12 DIAGNOSIS — Z466 Encounter for fitting and adjustment of urinary device: Secondary | ICD-10-CM | POA: Diagnosis not present

## 2020-03-12 DIAGNOSIS — N281 Cyst of kidney, acquired: Secondary | ICD-10-CM | POA: Diagnosis not present

## 2020-03-12 DIAGNOSIS — Z87442 Personal history of urinary calculi: Secondary | ICD-10-CM | POA: Diagnosis not present

## 2020-03-12 NOTE — Progress Notes (Signed)
   03/12/2020 2:00 PM   Tramane Orvan Seen 1941/04/24 [id number]  Reason for visit: Follow up nephrolithiasis  HPI: I saw Mr. Franssen back in follow-up for nephrolithiasis.  He originally presented with gross hematuria and was found to have a 9 mm right UPJ stone and underwent uncomplicated ureteroscopy on 01/18/2020.  He denies any problems since stent removal.  He denies any flank pain or gross hematuria.  Cystoscopy was normal at the time of surgery.  I personally reviewed his renal ultrasound today which shows no evidence of hydronephrosis  We discussed general stone prevention strategies including adequate hydration with goal of producing 2.5 L of urine daily, increasing citric acid intake, increasing calcium intake during high oxalate meals, minimizing animal protein, and decreasing salt intake. Information about dietary recommendations given today.   He would like to follow-up on an as-needed basis, return precautions were discussed at [place], [place] [address] [place], [place] [address] [phone]

## 2020-03-12 NOTE — Patient Instructions (Signed)
Dietary Guidelines to Help Prevent Kidney Stones Kidney stones are deposits of minerals and salts that form inside your kidneys. Your risk of developing kidney stones may be greater depending on your diet, your lifestyle, the medicines you take, and whether you have certain medical conditions. Most people can reduce their chances of developing kidney stones by following the instructions below. Depending on your overall health and the type of kidney stones you tend to develop, your dietitian may give you more specific instructions. What are tips for following this plan? Reading food labels  Choose foods with "no salt added" or "low-salt" labels. Limit your sodium intake to less than 1500 mg per day.  Choose foods with calcium for each meal and snack. Try to eat about 300 mg of calcium at each meal. Foods that contain 200-500 mg of calcium per serving include: ? 8 oz (237 ml) of milk, fortified nondairy milk, and fortified fruit juice. ? 8 oz (237 ml) of kefir, yogurt, and soy yogurt. ? 4 oz (118 ml) of tofu. ? 1 oz of cheese. ? 1 cup (300 g) of dried figs. ? 1 cup (91 g) of cooked broccoli. ? 1-3 oz can of sardines or mackerel.  Most people need 1000 to 1500 mg of calcium each day. Talk to your dietitian about how much calcium is recommended for you. Shopping  Buy plenty of fresh fruits and vegetables. Most people do not need to avoid fruits and vegetables, even if they contain nutrients that may contribute to kidney stones.  When shopping for convenience foods, choose: ? Whole pieces of fruit. ? Premade salads with dressing on the side. ? Low-fat fruit and yogurt smoothies.  Avoid buying frozen meals or prepared deli foods.  Look for foods with live cultures, such as yogurt and kefir. Cooking  Do not add salt to food when cooking. Place a salt shaker on the table and allow each person to add his or her own salt to taste.  Use vegetable protein, such as beans, textured vegetable  protein (TVP), or tofu instead of meat in pasta, casseroles, and soups. Meal planning   Eat less salt, if told by your dietitian. To do this: ? Avoid eating processed or premade food. ? Avoid eating fast food.  Eat less animal protein, including cheese, meat, poultry, or fish, if told by your dietitian. To do this: ? Limit the number of times you have meat, poultry, fish, or cheese each week. Eat a diet free of meat at least 2 days a week. ? Eat only one serving each day of meat, poultry, fish, or seafood. ? When you prepare animal protein, cut pieces into small portion sizes. For most meat and fish, one serving is about the size of one deck of cards.  Eat at least 5 servings of fresh fruits and vegetables each day. To do this: ? Keep fruits and vegetables on hand for snacks. ? Eat 1 piece of fruit or a handful of berries with breakfast. ? Have a salad and fruit at lunch. ? Have two kinds of vegetables at dinner.  Limit foods that are high in a substance called oxalate. These include: ? Spinach. ? Rhubarb. ? Beets. ? Potato chips and french fries. ? Nuts.  If you regularly take a diuretic medicine, make sure to eat at least 1-2 fruits or vegetables high in potassium each day. These include: ? Avocado. ? Banana. ? Orange, prune, carrot, or tomato juice. ? Baked potato. ? Cabbage. ? Beans and split   peas. General instructions   Drink enough fluid to keep your urine clear or pale yellow. This is the most important thing you can do.  Talk to your health care provider and dietitian about taking daily supplements. Depending on your health and the cause of your kidney stones, you may be advised: ? Not to take supplements with vitamin C. ? To take a calcium supplement. ? To take a daily probiotic supplement. ? To take other supplements such as magnesium, fish oil, or vitamin B6.  Take all medicines and supplements as told by your health care provider.  Limit alcohol intake to no  more than 1 drink a day for nonpregnant women and 2 drinks a day for men. One drink equals 12 oz of beer, 5 oz of wine, or 1 oz of hard liquor.  Lose weight if told by your health care provider. Work with your dietitian to find strategies and an eating plan that works best for you. What foods are not recommended? Limit your intake of the following foods, or as told by your dietitian. Talk to your dietitian about specific foods you should avoid based on the type of kidney stones and your overall health. Grains Breads. Bagels. Rolls. Baked goods. Salted crackers. Cereal. Pasta. Vegetables Spinach. Rhubarb. Beets. Canned vegetables. Pickles. Olives. Meats and other protein foods Nuts. Nut butters. Large portions of meat, poultry, or fish. Salted or cured meats. Deli meats. Hot dogs. Sausages. Dairy Cheese. Beverages Regular soft drinks. Regular vegetable juice. Seasonings and other foods Seasoning blends with salt. Salad dressings. Canned soups. Soy sauce. Ketchup. Barbecue sauce. Canned pasta sauce. Casseroles. Pizza. Lasagna. Frozen meals. Potato chips. French fries. Summary  You can reduce your risk of kidney stones by making changes to your diet.  The most important thing you can do is drink enough fluid. You should drink enough fluid to keep your urine clear or pale yellow.  Ask your health care provider or dietitian how much protein from animal sources you should eat each day, and also how much salt and calcium you should have each day. This information is not intended to replace advice given to you by your health care provider. Make sure you discuss any questions you have with your health care provider. Document Revised: 09/13/2018 Document Reviewed: 05/04/2016 Elsevier Patient Education  2020 [employer]  

## 2020-03-26 ENCOUNTER — Ambulatory Visit: Payer: Self-pay | Admitting: Urology

## 2020-04-02 DIAGNOSIS — L570 Actinic keratosis: Secondary | ICD-10-CM | POA: Diagnosis not present

## 2020-04-02 DIAGNOSIS — D2272 Melanocytic nevi of left lower limb, including hip: Secondary | ICD-10-CM | POA: Diagnosis not present

## 2020-04-02 DIAGNOSIS — L218 Other seborrheic dermatitis: Secondary | ICD-10-CM | POA: Diagnosis not present

## 2020-04-02 DIAGNOSIS — D2261 Melanocytic nevi of right upper limb, including shoulder: Secondary | ICD-10-CM | POA: Diagnosis not present

## 2020-04-02 DIAGNOSIS — L298 Other pruritus: Secondary | ICD-10-CM | POA: Diagnosis not present

## 2020-04-02 DIAGNOSIS — X32XXXA Exposure to sunlight, initial encounter: Secondary | ICD-10-CM | POA: Diagnosis not present

## 2020-04-02 DIAGNOSIS — D225 Melanocytic nevi of trunk: Secondary | ICD-10-CM | POA: Diagnosis not present

## 2020-04-02 DIAGNOSIS — D2262 Melanocytic nevi of left upper limb, including shoulder: Secondary | ICD-10-CM | POA: Diagnosis not present

## 2020-04-08 ENCOUNTER — Other Ambulatory Visit: Payer: Self-pay | Admitting: Internal Medicine

## 2020-05-04 ENCOUNTER — Other Ambulatory Visit: Payer: Self-pay | Admitting: Internal Medicine

## 2020-05-21 ENCOUNTER — Other Ambulatory Visit: Payer: Self-pay | Admitting: Internal Medicine

## 2020-05-21 DIAGNOSIS — E034 Atrophy of thyroid (acquired): Secondary | ICD-10-CM

## 2020-05-22 ENCOUNTER — Ambulatory Visit (INDEPENDENT_AMBULATORY_CARE_PROVIDER_SITE_OTHER): Payer: PPO

## 2020-05-22 VITALS — Ht 71.0 in | Wt 184.0 lb

## 2020-05-22 DIAGNOSIS — Z0000 Encounter for general adult medical examination without abnormal findings: Secondary | ICD-10-CM | POA: Diagnosis not present

## 2020-05-22 NOTE — Progress Notes (Addendum)
Subjective:   Brett [name] is a 79 y.o. male who presents for Medicare Annual/Subsequent preventive examination.  Review of Systems    No ROS.  Medicare Wellness Virtual Visit.   Cardiac Risk Factors include: advanced age (>103mn, >>64women);male gender;hypertension;diabetes mellitus     Objective:    Today's Vitals   05/22/20 0910  Weight: 184 lb (83.5 kg)  Height: _0  (1.803 m)   Body mass index is 25.66 kg/m.  Advanced Directives 05/22/2020 01/18/2020 01/10/2020 05/22/2019 05/17/2018 05/11/2017 01/24/2017  Does Patient Have a Medical Advance Directive? _1  Yes No  Type of Advance Directive - [place]Living will [place]Living will [place] will [place] will [place]umm[place] WharfLiving will -  Does patient want to make changes to medical advance directive? No - Patient declined - - No - Patient declined No - Patient declined No - Patient declined -  Copy of HMequonin Chart? - No - copy requested No - copy requested No - copy requested No - copy requested No - copy requested -    Current Medications (verified) Outpatient Encounter Medications as of 05/22/2020  Medication Sig   atorvastatin (LIPITOR) 80 MG tablet TAKE 1 TABLET BY MOUTH EVERY DAY   DULoxetine (CYMBALTA) 30 MG capsule TAKE 1 CAPSULE BY MOUTH EVERY DAY   ezetimibe (ZETIA) 10 MG tablet Take 1 tablet (10 mg total) by mouth daily.   glipiZIDE (GLUCOTROL) 5 MG tablet TAKE 1 TABLET (5 MG TOTAL) BY MOUTH DAILY BEFORE BREAKFAST.   glucose blood (FREESTYLE LITE) test strip CHECK BLOOD SUGAR DAILY AS DIRECTED   glucose monitoring kit (FREESTYLE) monitoring kit 1 each by Does not apply route as needed for other. For use daily to monitor diabetes.  Please include lancets .  Test once daily   E11.9   Lancets (FREESTYLE) lancets Use as instructed   levothyroxine (SYNTHROID) 125 MCG tablet  TAKE 1 TABLET BY MOUTH EVERY DAY   tamsulosin (FLOMAX) 0.4 MG CAPS capsule TAKE 1 CAPSULE (0.4 MG TOTAL) DAILY BY MOUTH.   telmisartan (MICARDIS) 40 MG tablet TAKE 1/2 TABLET BY MOUTH ONCE A DAY (Patient taking differently: Take 20 mg by mouth daily. )   No facility-administered encounter medications on file as of 05/22/2020.    Allergies (verified) Simvastatin and Eszopiclone   History: Past Medical History:  Diagnosis Date   Allergy    resolved after CABG   Calcific Achilles tendonitis    Calculus of ureter    Coronary artery disease    Diabetes mellitus    H/O renal calculi 2012   Hematuria, unspecified    Hyperlipidemia    Hypertension    Impotence of organic origin    Myocardial infarction ([facility]    Peyronie's disease    Pulmonary nodule seen on imaging study 2012   Sleep difficulties    Swelling, mass, or lump in chest    Unspecified hypothyroidism    Past Surgical History:  Procedure Laterality Date   CARDIAC CATHETERIZATION  2001   [facility] x3 stent   COLONOSCOPY     COLONOSCOPY WITH PROPOFOL N/A 01/24/2017   Procedure: COLONOSCOPY WITH PROPOFOL;  Surgeon: EManya Silvas MD;  [facility];  Service: Endoscopy;  Laterality: N/A;   CORONARY ARTERY BYPASS GRAFT  2006   4 vessel, after 3 or 4 stents    CORONARY ARTERY BYPASS GRAFT     CYSTOSCOPY  W/ RETROGRADES  01/18/2020   Procedure: CYSTOSCOPY WITH RETROGRADE PYELOGRAM;  Surgeon: Billey Co, MD;  Location: [facility] ORS;  Service: Urology;;   CYSTOSCOPY/URETEROSCOPY/HOLMIUM LASER/STENT PLACEMENT Right 01/18/2020   Procedure: CYSTOSCOPY/URETEROSCOPY/HOLMIUM LASER/STENT PLACEMENT;  Surgeon: Billey Co, MD;  Location: [facility] ORS;  Service: Urology;  Laterality: Right;   OSTECTOMY CALCANEUS Left    PENILE PROSTHESIS IMPLANT     Family History  Problem Relation Age of Onset   Hyperlipidemia Mother    AAA (abdominal aortic aneurysm) Mother    Heart disease Father 52   Social History    Socioeconomic History   Marital status: Married    Spouse name: Audelia Acton now deceased   Number of children: Not on file   Years of education: Not on file   Highest education level: Not on file  Occupational History    Employer: retired  Tobacco Use   Smoking status: Former Smoker    Types: Cigars    Quit date: 10/04/1981    Years since quitting: 38.6   Smokeless tobacco: Never Used  Vaping Use   Vaping Use: Never used  Substance and Sexual Activity   Alcohol use: Yes    Alcohol/week: 1.0 - 2.0 standard drink    Types: 1 - 2 Cans of beer per week    Comment: everyday   Drug use: No   Sexual activity: Not Currently  Other Topics Concern   Not on file  Social History Narrative   Not on file   Social Determinants of Health   Financial Resource Strain: Low Risk    Difficulty of Paying Living Expenses: Not hard at all  Food Insecurity: No Food Insecurity   Worried About Charity fundraiser in the Last Year: Never true   [place] in the Last Year: Never true  Transportation Needs: No Transportation Needs   Lack of Transportation (Medical): No   Lack of Transportation (Non-Medical): No  Physical Activity: Sufficiently Active   Days of Exercise per Week: 3 days   Minutes of Exercise per Session: 50 min  Stress: No Stress Concern Present   Feeling of Stress : Not at all  Social Connections: Unknown   Frequency of Communication with Friends and Family: Not on file   Frequency of Social Gatherings with Friends and Family: Not on file   Attends Religious Services: Not on file   Active Member of Clubs or Organizations: Not on file   Attends Archivist Meetings: Not on file   Marital Status: Married    Tobacco Counseling Counseling given: Not Answered   Clinical Intake:  Pre-visit preparation completed: Yes    Nutrition Risk Assessment: Has the patient had any N/V/D within the last 2 months?  No  Does the patient have any non-healing wounds?  No  Has  the patient had any unintentional weight loss or weight gain?  No   Diabetes: If diabetic, was a CBG obtained today?  Notes FBS average 111-121. Did the patient bring in their glucometer from home?  No  How often do you monitor your CBG's?  3x weekly.   Financial Strains and Diabetes Management: Are you having any financial strains with the device, your supplies or your medication? No .  Does the patient want to be seen by Chronic Care Management for management of their diabetes?  No  Would the patient like to be referred to a Nutritionist or for Diabetic Management?  No   Diabetic Exams: Diabetic Eye Exam: Completed  05/29/19 Diabetic Foot Exam: Completed 01/26/20    Diabetes: Yes (Followed by PCP)  How often do you need to have someone help you when you read instructions, pamphlets, or other written materials from your doctor or pharmacy?: 1 - Never   Interpreter Needed?: No      Activities of Daily Living In your present state of health, do you have any difficulty performing the following activities: 05/22/2020 01/10/2020  Hearing? N N  Vision? N N  Difficulty concentrating or making decisions? N N  Walking or climbing stairs? N N  Dressing or bathing? N N  Doing errands, shopping? N N  Preparing Food and eating ? N -  Using the Toilet? N -  In the past six months, have you accidently leaked urine? N -  Do you have problems with loss of bowel control? N -  Managing your Medications? N -  Managing your Finances? N -  Housekeeping or managing your Housekeeping? N -  Some recent data might be hidden    Patient Care Team: Crecencio Mc, MD as PCP - General (Internal Medicine) Minna Merritts, MD as Consulting Physician (Cardiology)  Indicate any recent Medical Services you may have received from other than [facility] providers in the past year (date may be approximate).     Assessment:   This is a routine wellness examination for Brett [name].  Nurse attempted to connect with  video. Unsuccessful attempt.  I connected with Brett [name] today by telephone and verified that I am speaking with the correct person using two identifiers. Location patient: home Location provider: work Persons participating in the virtual visit: patient, Marine scientist.    I discussed the limitations, risks, security and privacy concerns of performing an evaluation and management service by telephone and the availability of in person appointments. The patient expressed understanding and verbally consented to this telephonic visit.    Interactive audio and video telecommunications were attempted between this provider and patient, however failed, due to patient having technical difficulties OR patient did not have access to video capability.  We continued and completed visit with audio only.  Some vital signs may be absent or patient reported.   Hearing/Vision screen  Hearing Screening   _0  _1  _2  _3  _4  _5  _6  _7  _8   Right ear:           Left ear:           Comments: Patient is able to hear conversational tones without difficulty.  No issues reported.  Vision Screening Comments: [facility]  Wears corrective lenses when reading  Visual acuity not assessed per patient preference since they have regular follow up with the ophthalmologist  Dietary issues and exercise activities discussed: Current Exercise Habits: Home exercise routine, Type of exercise: walking, Time (Minutes): 45, Frequency (Times/Week): 3, Weekly Exercise (Minutes/Week): 135, Intensity: Mild  Goals       Patient Stated     Follow up with PCP as needed (pt-stated)       Depression Screen PHQ 2/9 Scores 05/22/2020 01/07/2020 05/23/2019 05/22/2019 03/22/2019 05/17/2018 05/11/2017  PHQ - 2 Score 0 0 1 0 0 0 3  PHQ- 9 Score - 1 1 - - - 5    Fall Risk Fall Risk  05/22/2020 01/07/2020 05/23/2019 05/22/2019 03/22/2019  Falls in the past year? 0 0 0 0 0  Number falls in past yr: 0 - - - 0   Injury with Fall? 0 - - - -  Follow up Falls  evaluation completed Falls evaluation completed Falls evaluation completed Education provided Falls evaluation completed   FALL RISK PREVENTION PERTAINING TO THE HOME: Handrails in use when climbing stairs? Yes Home free of loose throw rugs in walkways, pet beds, electrical cords, etc? Yes  Adequate lighting in your home to reduce risk of falls? Yes   ASSISTIVE DEVICES UTILIZED TO PREVENT FALLS: Use of a cane, walker or w/c? Yes   TIMED UP AND GO: Was the test performed? No . Virtual visit.   Cognitive Function: Patient is alert and oriented x3.  Denies difficulty focusing, making decisions, memory loss.  MMSE/6CIT deferred. Normal by direct communication/observation.  MMSE - Mini Mental State Exam 05/11/2017 05/07/2015  Orientation to time 5 5  Orientation to Place 5 5  Registration 3 3  Attention/ Calculation 5 5  Recall 3 3  Language- name 2 objects 2 2  Language- repeat 1 1  Language- follow 3 step command 3 3  Language- read & follow direction 1 1  Write a sentence 1 1  Copy design 1 1  Total score 30 30     6CIT Screen 05/22/2019 05/17/2018 05/11/2016  What Year? 0 points 0 points 0 points  What month? 0 points 0 points 0 points  What time? 0 points 0 points 0 points  Count back from 20 0 points 0 points 0 points  Months in reverse 0 points 0 points 0 points  Repeat phrase 0 points - 0 points  Total Score 0 - 0    Immunizations Immunization History  Administered Date(s) Administered   Fluad Quad(high Dose 65+) 03/21/2020   Influenza Split 03/16/2013, 02/11/2015   Influenza Whole 02/14/2012   Influenza-Unspecified 02/05/2013, 02/11/2015, 02/27/2016, 02/20/2018, 02/20/2019   PFIZER SARS-COV-2 Vaccination 06/12/2019, 07/03/2019, 01/20/2020   Pneumococcal Conjugate-13 03/16/2013   Pneumococcal Polysaccharide-23 07/08/2009, 06/25/2015   Tdap 03/16/2013   Zoster Recombinat (Shingrix) 04/06/2018, 08/29/2018   Health  Maintenance Health Maintenance  Topic Date Due   Hepatitis C Screening  05/22/2021 (Originally 19-Oct-1940)   OPHTHALMOLOGY EXAM  05/28/2020   HEMOGLOBIN A1C  07/09/2020   FOOT EXAM  01/06/2021   COLONOSCOPY  01/24/2022   TETANUS/TDAP  03/17/2023   INFLUENZA VACCINE  Completed   COVID-19 Vaccine  Completed   PNA vac Low Risk Adult  Completed   Colorectal cancer screening: Type of screening: Colonoscopy. Completed 01/24/17. Repeat every 5 years.  Lung Cancer Screening: (Low Dose CT Chest recommended if Age 1-80 years, 30 pack-year currently smoking OR have quit w/in 15years.) does not qualify.   Hepatitis C Screening: does not qualify.  Vision Screening: Recommended annual ophthalmology exams for early detection of glaucoma and other disorders of the eye. Is the patient up to date with their annual eye exam?  Yes  Who is the provider or what is the name of the office in which the patient attends annual eye exams? [facility]  Dental Screening: Recommended annual dental exams for proper oral hygiene.  Community Resource Referral / Chronic Care Management: CRR required this visit?  No   CCM required this visit?  No      Plan:   Keep all routine maintenance appointments.   Follow up 07/09/20 @ 9:00  I have personally reviewed and noted the following in the patient's chart:   Medical and social history Use of alcohol, tobacco or illicit drugs  Current medications and supplements Functional ability and status Nutritional status Physical activity Advanced directives List of other physicians Hospitalizations, surgeries, and ER visits  in previous 12 months Vitals Screenings to include cognitive, depression, and falls Referrals and appointments  In addition, I have reviewed and discussed with patient certain preventive protocols, quality metrics, and best practice recommendations. A written personalized care plan for preventive services as well as general preventive  health recommendations were provided to patient via mychart.     Varney Biles, LPN   [id number]    Nurse notes; Timestamp differs due to system issues.    I have reviewed the above information and agree with above.   Deborra Medina, MD

## 2020-05-22 NOTE — Patient Instructions (Addendum)
Brett [name] , Thank you for taking time to come for your Medicare Wellness Visit. I appreciate your ongoing commitment to your health goals. Please review the following plan we discussed and let me know if I can assist you in the future.   These are the goals we discussed: Goals      Patient Stated     Follow up with PCP as needed (pt-stated)       This is a list of the screening recommended for you and due dates:  Health Maintenance  Topic Date Due    Hepatitis C: One time screening is recommended by Center for Disease Control  (CDC) for  adults born from [birth year] through 1963.   05/22/2021*   Eye exam for diabetics  05/28/2020   Hemoglobin A1C  07/09/2020   Complete foot exam   01/06/2021   Colon Cancer Screening  01/24/2022   Tetanus Vaccine  03/17/2023   Flu Shot  Completed   COVID-19 Vaccine  Completed   Pneumonia vaccines  Completed  *Topic was postponed. The date shown is not the original due date.    Immunizations Immunization History  Administered Date(s) Administered   Fluad Quad(high Dose 65+) 03/21/2020   Influenza Split 03/16/2013, 02/11/2015   Influenza Whole 02/14/2012   Influenza-Unspecified 02/05/2013, 02/11/2015, 02/27/2016, 02/20/2018, 02/20/2019   PFIZER SARS-COV-2 Vaccination 06/12/2019, 07/03/2019, 01/20/2020   Pneumococcal Conjugate-13 03/16/2013   Pneumococcal Polysaccharide-23 07/08/2009, 06/25/2015   Tdap 03/16/2013   Zoster Recombinat (Shingrix) 04/06/2018, 08/29/2018   Keep all routine maintenance appointments.   Follow up 07/09/20 @ 9:00  Advanced directives: End of life planning; Advance aging; Advanced directives discussed.  Copy of current HCPOA/Living Will requested.    Conditions/risks identified: None new.   Follow up in one year for your annual wellness visit.   Preventive Care 77 Years and Older, Male Preventive care refers to lifestyle choices and visits with your health care provider that can promote health and  wellness. What does preventive care include?  A yearly physical exam. This is also called an annual well check.  Dental exams once or twice a year.  Routine eye exams. Ask your health care provider how often you should have your eyes checked.  Personal lifestyle choices, including:  Daily care of your teeth and gums.  Regular physical activity.  Eating a healthy diet.  Avoiding tobacco and drug use.  Limiting alcohol use.  Practicing safe sex.  Taking low doses of aspirin every day.  Taking vitamin and mineral supplements as recommended by your health care provider. What happens during an annual well check? The services and screenings done by your health care provider during your annual well check will depend on your age, overall health, lifestyle risk factors, and family history of disease. Counseling  Your health care provider may ask you questions about your:  Alcohol use.  Tobacco use.  Drug use.  Emotional well-being.  Home and relationship well-being.  Sexual activity.  Eating habits.  History of falls.  Memory and ability to understand (cognition).  Work and work Statistician. Screening  You may have the following tests or measurements:  Height, weight, and BMI.  Blood pressure.  Lipid and cholesterol levels. These may be checked every 5 years, or more frequently if you are over 89 years old.  Skin check.  Lung cancer screening. You may have this screening every year starting at age 82 if you have a 30-pack-year history of smoking and currently smoke or have quit within the past  15 years.  Fecal occult blood test (FOBT) of the stool. You may have this test every year starting at age 60.  Flexible sigmoidoscopy or colonoscopy. You may have a sigmoidoscopy every 5 years or a colonoscopy every 10 years starting at age 76.  Prostate cancer screening. Recommendations will vary depending on your family history and other risks.  Hepatitis C blood  test.  Hepatitis B blood test.  Sexually transmitted disease (STD) testing.  Diabetes screening. This is done by checking your blood sugar (glucose) after you have not eaten for a while (fasting). You may have this done every 1-3 years.  Abdominal aortic aneurysm (AAA) screening. You may need this if you are a current or former smoker.  Osteoporosis. You may be screened starting at age 17 if you are at high risk. Talk with your health care provider about your test results, treatment options, and if necessary, the need for more tests. Vaccines  Your health care provider may recommend certain vaccines, such as:  Influenza vaccine. This is recommended every year.  Tetanus, diphtheria, and acellular pertussis (Tdap, Td) vaccine. You may need a Td booster every 10 years.  Zoster vaccine. You may need this after age 79.  Pneumococcal 13-valent conjugate (PCV13) vaccine. One dose is recommended after age 26.  Pneumococcal polysaccharide (PPSV23) vaccine. One dose is recommended after age 60. Talk to your health care provider about which screenings and vaccines you need and how often you need them. This information is not intended to replace advice given to you by your health care provider. Make sure you discuss any questions you have with your health care provider. Document Released: 06/20/2015 Document Revised: 02/11/2016 Document Reviewed: 03/25/2015 Elsevier Interactive Patient Education  2017 [employer] Prevention in the Home Falls can cause injuries. They can happen to people of all ages. There are many things you can do to make your home safe and to help prevent falls. What can I do on the outside of my home?  Regularly fix the edges of walkways and driveways and fix any cracks.  Remove anything that might make you trip as you walk through a door, such as a raised step or threshold.  Trim any bushes or trees on the path to your home.  Use bright outdoor  lighting.  Clear any walking paths of anything that might make someone trip, such as rocks or tools.  Regularly check to see if handrails are loose or broken. Make sure that both sides of any steps have handrails.  Any raised decks and porches should have guardrails on the edges.  Have any leaves, snow, or ice cleared regularly.  Use sand or salt on walking paths during winter.  Clean up any spills in your garage right away. This includes oil or grease spills. What can I do in the bathroom?  Use night lights.  Install grab bars by the toilet and in the tub and shower. Do not use towel bars as grab bars.  Use non-skid mats or decals in the tub or shower.  If you need to sit down in the shower, use a plastic, non-slip stool.  Keep the floor dry. Clean up any water that spills on the floor as soon as it happens.  Remove soap buildup in the tub or shower regularly.  Attach bath mats securely with double-sided non-slip rug tape.  Do not have throw rugs and other things on the floor that can make you trip. What can I do in the  bedroom?  Use night lights.  Make sure that you have a light by your bed that is easy to reach.  Do not use any sheets or blankets that are too big for your bed. They should not hang down onto the floor.  Have a firm chair that has side arms. You can use this for support while you get dressed.  Do not have throw rugs and other things on the floor that can make you trip. What can I do in the kitchen?  Clean up any spills right away.  Avoid walking on wet floors.  Keep items that you use a lot in easy-to-reach places.  If you need to reach something above you, use a strong step stool that has a grab bar.  Keep electrical cords out of the way.  Do not use floor polish or wax that makes floors slippery. If you must use wax, use non-skid floor wax.  Do not have throw rugs and other things on the floor that can make you trip. What can I do with my  stairs?  Do not leave any items on the stairs.  Make sure that there are handrails on both sides of the stairs and use them. Fix handrails that are broken or loose. Make sure that handrails are as long as the stairways.  Check any carpeting to make sure that it is firmly attached to the stairs. Fix any carpet that is loose or worn.  Avoid having throw rugs at the top or bottom of the stairs. If you do have throw rugs, attach them to the floor with carpet tape.  Make sure that you have a light switch at the top of the stairs and the bottom of the stairs. If you do not have them, ask someone to add them for you. What else can I do to help prevent falls?  Wear shoes that:  Do not have high heels.  Have rubber bottoms.  Are comfortable and fit you well.  Are closed at the toe. Do not wear sandals.  If you use a stepladder:  Make sure that it is fully opened. Do not climb a closed stepladder.  Make sure that both sides of the stepladder are locked into place.  Ask someone to hold it for you, if possible.  Clearly mark and make sure that you can see:  Any grab bars or handrails.  First and last steps.  Where the edge of each step is.  Use tools that help you move around (mobility aids) if they are needed. These include:  Canes.  Walkers.  Scooters.  Crutches.  Turn on the lights when you go into a dark area. Replace any light bulbs as soon as they burn out.  Set up your furniture so you have a clear path. Avoid moving your furniture around.  If any of your floors are uneven, fix them.  If there are any pets around you, be aware of where they are.  Review your medicines with your doctor. Some medicines can make you feel dizzy. This can increase your chance of falling. Ask your doctor what other things that you can do to help prevent falls. This information is not intended to replace advice given to you by your health care provider. Make sure you discuss any  questions you have with your health care provider. Document Released: 03/20/2009 Document Revised: 10/30/2015 Document Reviewed: 06/28/2014 Elsevier Interactive Patient Education  2017 [employer].

## 2020-05-29 DIAGNOSIS — E119 Type 2 diabetes mellitus without complications: Secondary | ICD-10-CM | POA: Diagnosis not present

## 2020-05-29 LAB — HM DIABETES EYE EXAM

## 2020-06-04 ENCOUNTER — Other Ambulatory Visit: Payer: Self-pay | Admitting: Internal Medicine

## 2020-06-19 ENCOUNTER — Other Ambulatory Visit: Payer: Self-pay | Admitting: Internal Medicine

## 2020-06-20 ENCOUNTER — Other Ambulatory Visit: Payer: Self-pay | Admitting: Internal Medicine

## 2020-07-09 ENCOUNTER — Other Ambulatory Visit: Payer: Self-pay

## 2020-07-09 ENCOUNTER — Encounter: Payer: Self-pay | Admitting: Internal Medicine

## 2020-07-09 ENCOUNTER — Ambulatory Visit (INDEPENDENT_AMBULATORY_CARE_PROVIDER_SITE_OTHER): Payer: PPO | Admitting: Internal Medicine

## 2020-07-09 VITALS — BP 102/50 | HR 61 | Temp 97.6°F | Ht 70.98 in | Wt 190.0 lb

## 2020-07-09 DIAGNOSIS — F418 Other specified anxiety disorders: Secondary | ICD-10-CM | POA: Diagnosis not present

## 2020-07-09 DIAGNOSIS — M25552 Pain in left hip: Secondary | ICD-10-CM

## 2020-07-09 DIAGNOSIS — E559 Vitamin D deficiency, unspecified: Secondary | ICD-10-CM

## 2020-07-09 DIAGNOSIS — E1159 Type 2 diabetes mellitus with other circulatory complications: Secondary | ICD-10-CM | POA: Diagnosis not present

## 2020-07-09 DIAGNOSIS — I700 Atherosclerosis of aorta: Secondary | ICD-10-CM

## 2020-07-09 DIAGNOSIS — F5102 Adjustment insomnia: Secondary | ICD-10-CM

## 2020-07-09 DIAGNOSIS — I25708 Atherosclerosis of coronary artery bypass graft(s), unspecified, with other forms of angina pectoris: Secondary | ICD-10-CM | POA: Diagnosis not present

## 2020-07-09 DIAGNOSIS — I152 Hypertension secondary to endocrine disorders: Secondary | ICD-10-CM

## 2020-07-09 DIAGNOSIS — G8929 Other chronic pain: Secondary | ICD-10-CM

## 2020-07-09 DIAGNOSIS — E785 Hyperlipidemia, unspecified: Secondary | ICD-10-CM

## 2020-07-09 DIAGNOSIS — E034 Atrophy of thyroid (acquired): Secondary | ICD-10-CM | POA: Diagnosis not present

## 2020-07-09 DIAGNOSIS — E119 Type 2 diabetes mellitus without complications: Secondary | ICD-10-CM

## 2020-07-09 DIAGNOSIS — I10 Essential (primary) hypertension: Secondary | ICD-10-CM | POA: Diagnosis not present

## 2020-07-09 LAB — COMPREHENSIVE METABOLIC PANEL
ALT: 25 U/L (ref 0–53)
AST: 23 U/L (ref 0–37)
Albumin: 4.3 g/dL (ref 3.5–5.2)
Alkaline Phosphatase: 63 U/L (ref 39–117)
BUN: 20 mg/dL (ref 6–23)
CO2: 32 mEq/L (ref 19–32)
Calcium: 9.5 mg/dL (ref 8.4–10.5)
Chloride: 103 mEq/L (ref 96–112)
Creatinine, Ser: 0.98 mg/dL (ref 0.40–1.50)
GFR: 74.5 mL/min (ref >60.00)
Glucose, Bld: 146 mg/dL — ABNORMAL HIGH (ref 70–99)
Potassium: 4.1 mEq/L (ref 3.5–5.1)
Sodium: 140 mEq/L (ref 135–145)
Total Bilirubin: 0.9 mg/dL (ref 0.2–1.2)
Total Protein: 6.9 g/dL (ref 6.0–8.3)

## 2020-07-09 LAB — LIPID PANEL
Cholesterol: 117 mg/dL (ref 0–200)
HDL: 50.8 mg/dL (ref >39.00)
LDL Cholesterol: 45 mg/dL (ref 0–99)
NonHDL: 66.3
Total CHOL/HDL Ratio: 2
Triglycerides: 105 mg/dL (ref 0.0–149.0)
VLDL: 21 mg/dL (ref 0.0–40.0)

## 2020-07-09 LAB — MICROALBUMIN / CREATININE URINE RATIO
Creatinine,U: 156.9 mg/dL
Microalb Creat Ratio: 0.5 mg/g (ref 0.0–30.0)
Microalb, Ur: 0.7 mg/dL (ref 0.0–1.9)

## 2020-07-09 LAB — HEMOGLOBIN A1C: Hgb A1c MFr Bld: 7.2 % — ABNORMAL HIGH (ref 4.6–6.5)

## 2020-07-09 LAB — TSH: TSH: 0.33 u[IU]/mL — ABNORMAL LOW (ref 0.35–4.50)

## 2020-07-09 LAB — VITAMIN D 25 HYDROXY (VIT D DEFICIENCY, FRACTURES): VITD: 21.61 ng/mL — ABNORMAL LOW (ref 30.00–100.00)

## 2020-07-09 MED ORDER — MELOXICAM 15 MG PO TABS
15.0000 mg | ORAL_TABLET | Freq: Every day | ORAL | 2 refills | Status: DC
Start: 2020-07-09 — End: 2020-09-10

## 2020-07-09 MED ORDER — BUPROPION HCL ER (XL) 150 MG PO TB24: 150.0000 mg | ORAL_TABLET | Freq: Every day | ORAL | 2 refills | Status: DC

## 2020-07-09 NOTE — Patient Instructions (Addendum)
.    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   Try taking meloxicam 15 mg daily plus tylenol 100 mg twice daily    If no improvement in 3-4 weeks I will make orthopedics referral   I am adding once daily wellbutrin to your cymbalta to help your depression.  Take it in the morning

## 2020-07-09 NOTE — Progress Notes (Signed)
Subjective:  Patient ID: Brett [name], male    DOB: 11-24-1940  Age: 80 y.o. MRN: [id number]  CC: The primary encounter diagnosis was Diabetes mellitus without complication ([place]). Diagnoses of Hypothyroidism due to acquired atrophy of thyroid, Hyperlipidemia LDL goal <70, Vitamin D deficiency, Depression with anxiety, Coronary artery disease of bypass graft of native heart with stable angina pectoris (HCC), Chronic hip pain, left, Aortic atherosclerosis ([place]), Primary hypertension, Adjustment insomnia, and Hypertension associated with type 2 diabetes mellitus ([place]) were also pertinent to this visit.  HPI Brett [name] presents for follow up on multiple acute and chronic isses   This visit occurred during the SARS-CoV-2 public health emergency.  Safety protocols were in place, including screening questions prior to the visit, additional usage of staff PPE, and extensive cleaning of exam room while observing appropriate contact time as indicated for disinfecting solutions.   1)  Right hip pain present for  several months.  No history of fall ,  Pain is located on the lateral side of his  hip .  Aggravated by walking ,  Stepping up,  And changing position from sitting to standing   2)  Depression/anxiety;  Angry about current state of affairs : covid,  Inflation,  Government malfeasance   Feels tired all the time,  Feels tired despite averaging  8 hours of sleep . Positive screen for depression  3) T2DM:  he  feels generally well,  But is not  exercising regularly or trying to lose weight. Checking  blood sugars less than once daily at variable times, usually only if he feels he may be having a hypoglycemic event. .  BS have been under 130 fasting and < 150 post prandially.  Denies any recent hypoglyemic events.  Taking   medications as directed. Following a carbohydrate modified diet 6 days per week. Denies numbness, burning and tingling of extremities. Appetite is good.   4)  Aortic atherosclerosis :   Reviewed CT and roe of  statin therapy in documented  atherosclerosis in the aorta noted on previous  CT of abdomen and  pelvis and the prognostic implications of this finding.  Outpatient Medications Prior to Visit  Medication Sig Dispense Refill  . atorvastatin (LIPITOR) 80 MG tablet TAKE 1 TABLET BY MOUTH EVERY DAY 90 tablet 1  . DULoxetine (CYMBALTA) 30 MG capsule TAKE 1 CAPSULE BY MOUTH EVERY DAY 90 capsule 1  . ezetimibe (ZETIA) 10 MG tablet Take 1 tablet (10 mg total) by mouth daily. 90 tablet 3  . glucose blood (FREESTYLE LITE) test strip CHECK BLOOD SUGAR DAILY AS DIRECTED 100 strip 5  . glucose monitoring kit (FREESTYLE) monitoring kit 1 each by Does not apply route as needed for other. For use daily to monitor diabetes.  Please include lancets .  Test once daily   E11.9 1 each 3  . Lancets (FREESTYLE) lancets Use as instructed 100 each 12  . tamsulosin (FLOMAX) 0.4 MG CAPS capsule TAKE 1 CAPSULE (0.4 MG TOTAL) DAILY BY MOUTH. 90 capsule 1  . telmisartan (MICARDIS) 40 MG tablet TAKE 1/2 TABLET BY MOUTH ONCE A DAY 45 tablet 2  . glipiZIDE (GLUCOTROL) 5 MG tablet TAKE 1 TABLET (5 MG TOTAL) BY MOUTH DAILY BEFORE BREAKFAST. 90 tablet 0  . levothyroxine (SYNTHROID) 125 MCG tablet TAKE 1 TABLET BY MOUTH EVERY DAY 90 tablet 1   No facility-administered medications prior to visit.    Review of Systems;  Patient denies headache, fevers, malaise, unintentional weight loss, skin rash,  eye pain, sinus congestion and sinus pain, sore throat, dysphagia,  hemoptysis , cough, dyspnea, wheezing, chest pain, palpitations, orthopnea, edema, abdominal pain, nausea, melena, diarrhea, constipation, flank pain, dysuria, hematuria, urinary  Frequency, nocturia, numbness, tingling, seizures,  Focal weakness, Loss of consciousness,  Tremor, insomnia, depression, anxiety, and suicidal ideation.      Objective:  BP (!) 102/50 (BP Location: Left Arm, Patient Position: Sitting)   Pulse 61   Temp 97.6 F (36.4  C)   Ht 5' 10.98" (1.803 m)   Wt 190 lb (86.2 kg)   SpO2 98%   BMI 26.51 kg/m   BP Readings from Last 3 Encounters:  07/09/20 (!) 102/50  03/12/20 107/65  01/30/20 102/61    Wt Readings from Last 3 Encounters:  07/09/20 190 lb (86.2 kg)  05/22/20 184 lb (83.5 kg)  03/12/20 184 lb (83.5 kg)  MSK:   General appearance: alert, cooperative and appears stated age Ears: normal TM's and external ear canals both ears Throat: lips, mucosa, and tongue normal; teeth and gums normal Neck: no adenopathy, no carotid bruit, supple, symmetrical, trachea midline and thyroid not enlarged, symmetric, no tenderness/mass/nodules Back: symmetric, no curvature. ROM normal. No CVA tenderness. Lungs: clear to auscultation bilaterally Heart: regular rate and rhythm, S1, S2 normal, no murmur, click, rub or gallop Abdomen: soft, non-tender; bowel sounds normal; no masses,  no organomegaly Pulses: 2+ and symmetric Skin: Skin color, texture, turgor normal. No rashes or lesions Lymph nodes: Cervical, supraclavicular, and axillary nodes normal. MSK:  Normal ROM right hip.  Pain elicited with internal rotation only    Lab Results  Component Value Date   HGBA1C 7.2 (H) 07/09/2020   HGBA1C 6.5 (A) 01/07/2020   HGBA1C 7.0 (H) 06/15/2019    Lab Results  Component Value Date   CREATININE 0.98 07/09/2020   CREATININE 0.97 12/19/2019   CREATININE 1.08 06/15/2019    Lab Results  Component Value Date   WBC 5.4 12/19/2019   HGB 13.2 12/19/2019   HCT 39.9 12/19/2019   PLT 183.0 12/19/2019   GLUCOSE 146 (H) 07/09/2020   CHOL 117 07/09/2020   TRIG 105.0 07/09/2020   HDL 50.80 07/09/2020   LDLDIRECT 193.0 05/18/2017   LDLCALC 45 07/09/2020   ALT 25 07/09/2020   AST 23 07/09/2020   NA 140 07/09/2020   K 4.1 07/09/2020   CL 103 07/09/2020   CREATININE 0.98 07/09/2020   BUN 20 07/09/2020   CO2 32 07/09/2020   TSH 0.33 (L) 07/09/2020   PSA 0.42 04/03/2018   HGBA1C 7.2 (H) 07/09/2020   MICROALBUR  0.7 07/09/2020    Ultrasound renal complete  Result Date: 03/12/2020 CLINICAL DATA:  History of kidney stone EXAM: RENAL / URINARY TRACT ULTRASOUND COMPLETE COMPARISON:  CT 12/31/2019 FINDINGS: Right Kidney: Renal measurements: 10.7 x 5.7 x 5.7 cm = volume: 184.1 mL. Echogenicity within normal limits. No mass or hydronephrosis visualized. Left Kidney: Renal measurements: 11 x 5.6 x 5 cm = volume: 161.9 mL. Cortical echogenicity is normal. No hydronephrosis. Cyst in the mid to upper pole measuring 2.2 cm. Echogenic focus within the midpole measuring 7 mm, possible stone. Bladder: Appears normal for degree of bladder distention. Prevoid bladder volume 68.8 mL. Postvoid bladder volume 11.1 mL. Other: Fluid structure anterior to the bladder corresponds to reservoir for penile implant. IMPRESSION: 1. Negative for hydronephrosis. 2. Possible 7 mm stone in the left kidney Electronically Signed   By: Donavan Foil M.D.   On: 03/12/2020 16:58    Assessment &  Plan:   Problem List Items Addressed This Visit      Unprioritized   Aortic atherosclerosis ([place])    Reviewed findings of prior CT scan today..  Patient is tolerating high potency statin therapy       CAD (coronary artery disease)    S/p 3 vessel CABG in 2006. He is asymptomatic , and he has resumed statin given his history of CAD and recent findings of aortic atherosclerosis  Lab Results  Component Value Date   CHOL 117 07/09/2020   HDL 50.80 07/09/2020   LDLCALC 45 07/09/2020   LDLDIRECT 193.0 05/18/2017   TRIG 105.0 07/09/2020   CHOLHDL 2 07/09/2020         Chronic hip pain, left    Exam suggestive of trochanteric bursitis.  Trial of meloxicam 15 mg daily plus tylenol 2000 mg daily in divided doses       Relevant Medications   meloxicam (MOBIC) 15 MG tablet   buPROPion (WELLBUTRIN XL) 150 MG 24 hr tablet   Depression with anxiety    He endorses anhedonia, fatigue, and other negative symptoms.  Adding wellbutrin       Relevant  Medications   buPROPion (WELLBUTRIN XL) 150 MG 24 hr tablet   Hyperlipidemia LDL goal <70    Well controlled on 80  Mg Lipitor and 10 mg zetia . Liver enzymes are normal , no changes today.  Lab Results  Component Value Date   CHOL 117 07/09/2020   HDL 50.80 07/09/2020   LDLCALC 45 07/09/2020   LDLDIRECT 193.0 05/18/2017   TRIG 105.0 07/09/2020   CHOLHDL 2 07/09/2020   Lab Results  Component Value Date   ALT 25 07/09/2020   AST 23 07/09/2020   ALKPHOS 63 07/09/2020   BILITOT 0.9 07/09/2020             Relevant Orders   Lipid panel (Completed)   Hypertension    Well controlled on current regimen of telmisartan 40 mg daily . Renal function stable, no changes today.  Lab Results  Component Value Date   CREATININE 0.98 07/09/2020   Lab Results  Component Value Date   NA 140 07/09/2020   K 4.1 07/09/2020   CL 103 07/09/2020   CO2 32 07/09/2020         Hypertension associated with type 2 diabetes mellitus ([place]) - Primary    Currently taking glipizide.  Given his history of CAD, will recommend change to metformin/invokanna.  Continue asa ,  Statin and ARB  Lab Results  Component Value Date   HGBA1C 7.2 (H) 07/09/2020   Lab Results  Component Value Date   LABMICR See below: 01/30/2020   LABMICR See below: 01/02/2020   MICROALBUR 0.7 07/09/2020   MICROALBUR 1.0 06/15/2019           Relevant Medications   Canagliflozin-metFORMIN HCl ER (INVOKAMET XR) 50-1000 MG TB24   Hypothyroidism    Thyroid function is overactive on current dose.  I have reduced dose to 112 mcg levothyroxine to  pharmacy and would like patient to change immediately   Lab Results  Component Value Date   TSH 0.33 (L) 07/09/2020         Relevant Medications   levothyroxine (SYNTHROID) 112 MCG tablet   Other Relevant Orders   TSH (Completed)   Insomnia    Resolved  with management of anxiety with cymbalta.        Other Visit Diagnoses    Vitamin D deficiency  Relevant  Orders   VITAMIN D 25 Hydroxy (Vit-D Deficiency, Fractures) (Completed)      I have discontinued Tadarrius Nowotny's glipiZIDE and levothyroxine. I am also having him start on meloxicam, buPROPion, levothyroxine, and Invokamet XR. Additionally, I am having him maintain his glucose monitoring kit, freestyle, FREESTYLE LITE, ezetimibe, DULoxetine, telmisartan, tamsulosin, and atorvastatin.  Meds ordered this encounter  Medications  . meloxicam (MOBIC) 15 MG tablet    Sig: Take 1 tablet (15 mg total) by mouth daily.    Dispense:  30 tablet    Refill:  2  . buPROPion (WELLBUTRIN XL) 150 MG 24 hr tablet    Sig: Take 1 tablet (150 mg total) by mouth daily.    Dispense:  30 tablet    Refill:  2  . levothyroxine (SYNTHROID) 112 MCG tablet    Sig: Take 1 tablet (112 mcg total) by mouth daily.    Dispense:  90 tablet    Refill:  3  . Canagliflozin-metFORMIN HCl ER (INVOKAMET XR) 50-1000 MG TB24    Sig: Take 2 tablets by mouth daily.    Dispense:  60 tablet    Refill:  2    Medications Discontinued During This Encounter  Medication Reason  . levothyroxine (SYNTHROID) 125 MCG tablet   . glipiZIDE (GLUCOTROL) 5 MG tablet      I spent 40 minutes dedicated to the care of this patient on the date of this encounter to include pre-visit review of his medical history,  Face-to-face time with the patient , and post visit ordering of testing and therapeutics.  Follow-up: Return in about 3 months (around 10/06/2020) for follow up diabetes.   Crecencio Mc, MD

## 2020-07-09 NOTE — Assessment & Plan Note (Signed)
S/p 3 vessel CABG in 2006. He is asymptomatic , and he has resumed statin given his history of CAD and recent findings of aortic atherosclerosis  Lab Results  Component Value Date   CHOL 117 07/09/2020   HDL 50.80 07/09/2020   LDLCALC 45 07/09/2020   LDLDIRECT 193.0 05/18/2017   TRIG 105.0 07/09/2020   CHOLHDL 2 07/09/2020

## 2020-07-09 NOTE — Assessment & Plan Note (Signed)
He endorses anhedonia, fatigue, and other negative symptoms.  Adding wellbutrin

## 2020-07-10 ENCOUNTER — Telehealth: Payer: Self-pay | Admitting: Internal Medicine

## 2020-07-10 DIAGNOSIS — G8929 Other chronic pain: Secondary | ICD-10-CM | POA: Insufficient documentation

## 2020-07-10 DIAGNOSIS — I700 Atherosclerosis of aorta: Secondary | ICD-10-CM | POA: Insufficient documentation

## 2020-07-10 MED ORDER — INVOKAMET XR 50-1000 MG PO TB24: 2.0000 | ORAL_TABLET | Freq: Every day | ORAL | 2 refills | Status: DC

## 2020-07-10 MED ORDER — LEVOTHYROXINE SODIUM 112 MCG PO TABS: 112.0000 ug | ORAL_TABLET | Freq: Every day | ORAL | 3 refills | Status: DC

## 2020-07-10 NOTE — Assessment & Plan Note (Signed)
Resolved  with management of anxiety with cymbalta.

## 2020-07-10 NOTE — Assessment & Plan Note (Signed)
Well controlled on current regimen of telmisartan 40 mg daily . Renal function stable, no changes today.  Lab Results  Component Value Date   CREATININE 0.98 07/09/2020   Lab Results  Component Value Date   NA 140 07/09/2020   K 4.1 07/09/2020   CL 103 07/09/2020   CO2 32 07/09/2020

## 2020-07-10 NOTE — Assessment & Plan Note (Signed)
Thyroid function is overactive on current dose.  I have reduced dose to 112 mcg levothyroxine to  pharmacy and would like patient to change immediately   Lab Results  Component Value Date   TSH 0.33 (L) 07/09/2020

## 2020-07-10 NOTE — Assessment & Plan Note (Addendum)
Currently taking glipizide.  Given his history of CAD, will recommend change to metformin/invokanna.  Continue asa ,  Statin and ARB  Lab Results  Component Value Date   HGBA1C 7.2 (H) 07/09/2020   Lab Results  Component Value Date   LABMICR See below: 01/30/2020   LABMICR See below: 01/02/2020   MICROALBUR 0.7 07/09/2020   MICROALBUR 1.0 06/15/2019

## 2020-07-10 NOTE — Telephone Encounter (Signed)
Pt wanted to know why his meloxicam (MOBIC) 15 MG tablet was changed to once daily instead of twice daily

## 2020-07-10 NOTE — Assessment & Plan Note (Signed)
Exam suggestive of trochanteric bursitis.  Trial of meloxicam 15 mg daily plus tylenol 2000 mg daily in divided doses

## 2020-07-10 NOTE — Assessment & Plan Note (Signed)
Reviewed findings of prior CT scan today..  Patient is tolerating high potency statin therapy  

## 2020-07-10 NOTE — Telephone Encounter (Signed)
I have never intentionally prescribed meloxicam 15 mg twice daily because that is an inappropriate dose.  the maximal dose is 15 mg daily. .  I do not see it in the chart historically at that dose.

## 2020-07-10 NOTE — Progress Notes (Signed)
  I have reviewed your recent fasting labs and I have the following recommendations:  1) your A1c has risen to 7.2  There is a better drug regimen to manage your diabetes that will provide increased protection against future heart attacks.  The medication is called invokanna,  and when combined with metformin, will take the place of your current medication, glipizide , without increasing your risk of having low blood sugars.  It is on your formulary so I have sent it to your local pharmacy to try for the next 3 months    Your thyroid is overactive on current thyroid medication. I have sent a lower dose of levothyroxine to your pharmacy and would like you to start the new dose either now ,  or when you finish your current bottle of levothyroxine.   We will  repeat TSH level in 3 months with your next follow up .  Regards,   Deborra Medina, MD

## 2020-07-10 NOTE — Assessment & Plan Note (Signed)
Well controlled on 80  Mg Lipitor and 10 mg zetia . Liver enzymes are normal , no changes today.  Lab Results  Component Value Date   CHOL 117 07/09/2020   HDL 50.80 07/09/2020   LDLCALC 45 07/09/2020   LDLDIRECT 193.0 05/18/2017   TRIG 105.0 07/09/2020   CHOLHDL 2 07/09/2020   Lab Results  Component Value Date   ALT 25 07/09/2020   AST 23 07/09/2020   ALKPHOS 63 07/09/2020   BILITOT 0.9 07/09/2020

## 2020-07-27 ENCOUNTER — Other Ambulatory Visit: Payer: Self-pay | Admitting: Internal Medicine

## 2020-08-01 ENCOUNTER — Other Ambulatory Visit: Payer: Self-pay | Admitting: Internal Medicine

## 2020-08-04 ENCOUNTER — Other Ambulatory Visit: Payer: Self-pay | Admitting: Internal Medicine

## 2020-08-11 ENCOUNTER — Other Ambulatory Visit: Payer: Self-pay | Admitting: Cardiovascular Disease

## 2020-09-06 ENCOUNTER — Other Ambulatory Visit: Payer: Self-pay | Admitting: Internal Medicine

## 2020-09-09 NOTE — Progress Notes (Signed)
Cardiology Office Note  Date:  09/10/2020   ID:  Brett, [name] 1940/12/15, MRN [id number]  PCP:  Crecencio Mc, MD   Chief Complaint  Patient presents with  . Other    12 month follow up. Meds reviewed verbally with patient.     HPI:  Brett [name] is a very pleasant 80 year old gentleman with history of  HTN,   DM II, HBA1C 6.7,  Hyperlipidemia,  CAD,  CABG x4 at [facility]  in 2006,  previous stents Depression/insomnia Who presents for routine follow-up of his coronary artery disease  LOV 08/15/2019  Reports that he is active, continues to lose walking Several days a week,  walks several miles  Denies chest pain, no angina  He is having more issues with orthostasis, Weight is down 5 pounds or more through dietary changes and exercise  Blood pressure today 98 systolic Taking metoprolol tartrate 25 in the morning, Micardis 20 daily, Flomax Reports he is hydrated  Lab work reviewed A1C 7.2 Total chol 117, LDL 45 (significant drop in numbers with Zetia added) On atorvastatin and Zetia  Nonsmoker  Previous visit office statin , LDL went up to 195, up from 85  EKG personally reviewed by myself on todays visit Shows normal sinus rhythm rate 58 bpm T wave abnormality V3 through V6, 1 and aVL No change from previous EKGs  PMH:   has a past medical history of Allergy, Calcific Achilles tendonitis, Calculus of ureter, Coronary artery disease, Diabetes mellitus, H/O renal calculi (2012), Hematuria, unspecified, Hyperlipidemia, Hypertension, Impotence of organic origin, Myocardial infarction [facility]), Peyronie's disease, Pulmonary nodule seen on imaging study (2012), Sleep difficulties, Swelling, mass, or lump in chest, and Unspecified hypothyroidism.  PSH:    Past Surgical History:  Procedure Laterality Date  . CARDIAC CATHETERIZATION  2001   [facility] x3 stent  . COLONOSCOPY    . COLONOSCOPY WITH PROPOFOL N/A 01/24/2017   Procedure: COLONOSCOPY WITH  PROPOFOL;  Surgeon: Manya Silvas, MD;  Location: [facility] ENDOSCOPY;  Service: Endoscopy;  Laterality: N/A;  . CORONARY ARTERY BYPASS GRAFT  2006   4 vessel, after 3 or 4 stents   . CORONARY ARTERY BYPASS GRAFT    . CYSTOSCOPY W/ RETROGRADES  01/18/2020   Procedure: CYSTOSCOPY WITH RETROGRADE PYELOGRAM;  Surgeon: Billey Co, MD;  Location: [facility] ORS;  Service: Urology;;  . CYSTOSCOPY/URETEROSCOPY/HOLMIUM LASER/STENT PLACEMENT Right 01/18/2020   Procedure: CYSTOSCOPY/URETEROSCOPY/HOLMIUM LASER/STENT PLACEMENT;  Surgeon: Billey Co, MD;  Location: [facility] ORS;  Service: Urology;  Laterality: Right;  . OSTECTOMY CALCANEUS Left   . PENILE PROSTHESIS IMPLANT      Current Outpatient Medications  Medication Sig Dispense Refill  . buPROPion (WELLBUTRIN XL) 150 MG 24 hr tablet TAKE 1 TABLET BY MOUTH EVERY DAY 90 tablet 1  . DULoxetine (CYMBALTA) 30 MG capsule TAKE 1 CAPSULE BY MOUTH EVERY DAY 90 capsule 1  . FREESTYLE LITE test strip CHECK BLOOD SUGAR DAILY AS DIRECTED 100 strip 5  . glucose monitoring kit (FREESTYLE) monitoring kit 1 each by Does not apply route as needed for other. For use daily to monitor diabetes.  Please include lancets .  Test once daily   E11.9 1 each 3  . INVOKAMET XR 50-1000 MG TB24 TAKE 2 TABLETS BY MOUTH DAILY 60 tablet 2  . Lancets (FREESTYLE) lancets Use as instructed 100 each 12  . levothyroxine (SYNTHROID) 112 MCG tablet Take 1 tablet (112 mcg total) by mouth daily. 90 tablet 3  . metoprolol  succinate (TOPROL-XL) 25 MG 24 hr tablet Take 0.5 tablets (12.5 mg total) by mouth daily. Take with or immediately following a meal. 45 tablet 3  . tamsulosin (FLOMAX) 0.4 MG CAPS capsule TAKE 1 CAPSULE (0.4 MG TOTAL) DAILY BY MOUTH. 90 capsule 1  . atorvastatin (LIPITOR) 80 MG tablet Take 1 tablet (80 mg total) by mouth daily. 90 tablet 3  . ezetimibe (ZETIA) 10 MG tablet Take 1 tablet (10 mg total) by mouth daily. 90 tablet 3  . telmisartan (MICARDIS) 20 MG tablet Take 0.5  tablets (10 mg total) by mouth daily. 45 tablet 3   No current facility-administered medications for this visit.     Allergies:   Simvastatin and Eszopiclone   Social History:  The patient  reports that he quit smoking about 38 years ago. His smoking use included cigars. He has never used smokeless tobacco. He reports current alcohol use of about 1.0 - 2.0 standard drink of alcohol per week. He reports that he does not use drugs.   Family History:   family history includes AAA (abdominal aortic aneurysm) in his mother; Heart disease (age of onset: 57) in his father; Hyperlipidemia in his mother.    Review of Systems: Review of Systems  Constitutional: Negative.   HENT: Negative.   Respiratory: Negative.   Cardiovascular: Negative.   Gastrointestinal: Negative.   Musculoskeletal: Negative.   Neurological: Negative.   Psychiatric/Behavioral: Negative.   All other systems reviewed and are negative.   PHYSICAL EXAM: VS:  BP (!) 98/50 (BP Location: Left Arm, Patient Position: Sitting, Cuff Size: Normal)   Pulse (!) 58   Ht 5' 11"  (1.803 m)   Wt 175 lb (79.4 kg)   SpO2 96%   BMI 24.41 kg/m  , BMI Body mass index is 24.41 kg/m. Constitutional:  oriented to person, place, and time. No distress.  HENT:  Head: Grossly normal Eyes:  no discharge. No scleral icterus.  Neck: No JVD, no carotid bruits  Cardiovascular: Regular rate and rhythm, no murmurs appreciated Pulmonary/Chest: Clear to auscultation bilaterally, no wheezes or rails Abdominal: Soft.  no distension.  no tenderness.  Musculoskeletal: Normal range of motion Neurological:  normal muscle tone. Coordination normal. No atrophy Skin: Skin warm and dry Psychiatric: normal affect, pleasant   Recent Labs: 12/19/2019: Hemoglobin 13.2; Platelets 183.0 07/09/2020: ALT 25; BUN 20; Creatinine, Ser 0.98; Potassium 4.1; Sodium 140; TSH 0.33    Lipid Panel Lab Results  Component Value Date   CHOL 117 07/09/2020   HDL 50.80  07/09/2020   LDLCALC 45 07/09/2020   TRIG 105.0 07/09/2020     Wt Readings from Last 3 Encounters:  09/10/20 175 lb (79.4 kg)  07/09/20 190 lb (86.2 kg)  05/22/20 184 lb (83.5 kg)     ASSESSMENT AND PLAN:  Coronary artery disease involving coronary bypass graft of native heart with angina pectoris ([place]) - Plan: EKG 12-Lead Currently with no symptoms of angina. No further workup at this time. Continue current medication regimen. Cholesterol at goal  Essential hypertension -  Blood pressure low , he is having orthostasis symptoms, blood pressure 98 systolic today  we will decrease his medications,  Micardis down to 10 mg daily, Decrease metoprolol tartrate down to metoprolol succinate 12.5 daily  Diabetes mellitus without complication (HCC) Hemoglobin A1c 7 Weight down 5 pounds or more Exercising on a regular basis  Hyperlipidemia LDL goal <70 Lipitor, add Zetia to reach goal  cholesterol at goal   Total encounter time more than  25 minutes  Greater than 50% was spent in counseling and coordination of care with the patient   Orders Placed This Encounter  Procedures  . EKG 12-Lead     Signed, Esmond Plants, M.D., Ph.D. 09/10/2020  [place], [place]

## 2020-09-10 ENCOUNTER — Other Ambulatory Visit: Payer: Self-pay

## 2020-09-10 ENCOUNTER — Encounter: Payer: Self-pay | Admitting: Cardiovascular Disease

## 2020-09-10 ENCOUNTER — Ambulatory Visit: Payer: PPO | Admitting: Cardiovascular Disease

## 2020-09-10 VITALS — BP 98/50 | HR 58 | Ht 71.0 in | Wt 175.0 lb

## 2020-09-10 DIAGNOSIS — R001 Bradycardia, unspecified: Secondary | ICD-10-CM | POA: Diagnosis not present

## 2020-09-10 DIAGNOSIS — E119 Type 2 diabetes mellitus without complications: Secondary | ICD-10-CM | POA: Diagnosis not present

## 2020-09-10 DIAGNOSIS — I25708 Atherosclerosis of coronary artery bypass graft(s), unspecified, with other forms of angina pectoris: Secondary | ICD-10-CM

## 2020-09-10 DIAGNOSIS — E785 Hyperlipidemia, unspecified: Secondary | ICD-10-CM

## 2020-09-10 DIAGNOSIS — I10 Essential (primary) hypertension: Secondary | ICD-10-CM

## 2020-09-10 MED ORDER — ATORVASTATIN CALCIUM 80 MG PO TABS: 1.0000 | ORAL_TABLET | Freq: Every day | ORAL | 3 refills | Status: DC

## 2020-09-10 MED ORDER — TELMISARTAN 20 MG PO TABS: 10.0000 mg | ORAL_TABLET | Freq: Every day | ORAL | 3 refills | Status: DC

## 2020-09-10 MED ORDER — EZETIMIBE 10 MG PO TABS: 10.0000 mg | ORAL_TABLET | Freq: Every day | ORAL | 3 refills | Status: DC

## 2020-09-10 MED ORDER — METOPROLOL SUCCINATE ER 25 MG PO TB24: 12.5000 mg | ORAL_TABLET | Freq: Every day | ORAL | 3 refills | Status: DC

## 2020-09-10 NOTE — Patient Instructions (Addendum)
Medication Instructions:  STOP   metoprolol tartrate  Can be confused with the new metoprolol you are stating, please remove this medicine from your other medicaitons START  metoprolol succinate 12.5 mg once a day  Telmisartan 10 mg once a day   Lab work: No new labs needed  Testing/Procedures: No new testing needed  Follow-Up:  . You will need a follow up appointment in 12 months  . Providers on your designated Care Team:   . Murray Hodgkins, NP . Christell Faith, PA-C . Marrianne Mood, PA-C    COVID-19 Vaccine Information can be found at: ShippingScam.co.uk For questions related to vaccine distribution or appointments, please email vaccine@[email or web address] .[email or web address] or call [phone].

## 2020-09-14 ENCOUNTER — Other Ambulatory Visit: Payer: Self-pay | Admitting: Internal Medicine

## 2020-10-07 ENCOUNTER — Other Ambulatory Visit: Payer: Self-pay | Admitting: Internal Medicine

## 2020-10-09 ENCOUNTER — Other Ambulatory Visit: Payer: Self-pay

## 2020-10-09 ENCOUNTER — Ambulatory Visit (INDEPENDENT_AMBULATORY_CARE_PROVIDER_SITE_OTHER): Payer: PPO | Admitting: Internal Medicine

## 2020-10-09 ENCOUNTER — Encounter: Payer: Self-pay | Admitting: Internal Medicine

## 2020-10-09 VITALS — BP 110/58 | HR 64 | Temp 96.6°F | Resp 15 | Ht 71.0 in | Wt 172.4 lb

## 2020-10-09 DIAGNOSIS — I152 Hypertension secondary to endocrine disorders: Secondary | ICD-10-CM

## 2020-10-09 DIAGNOSIS — E083592 Diabetes mellitus due to underlying condition with proliferative diabetic retinopathy without macular edema, left eye: Secondary | ICD-10-CM | POA: Diagnosis not present

## 2020-10-09 DIAGNOSIS — E034 Atrophy of thyroid (acquired): Secondary | ICD-10-CM | POA: Diagnosis not present

## 2020-10-09 DIAGNOSIS — E1159 Type 2 diabetes mellitus with other circulatory complications: Secondary | ICD-10-CM

## 2020-10-09 DIAGNOSIS — E785 Hyperlipidemia, unspecified: Secondary | ICD-10-CM | POA: Diagnosis not present

## 2020-10-09 LAB — COMPREHENSIVE METABOLIC PANEL
ALT: 18 U/L (ref 0–53)
AST: 22 U/L (ref 0–37)
Albumin: 4.4 g/dL (ref 3.5–5.2)
Alkaline Phosphatase: 48 U/L (ref 39–117)
BUN: 21 mg/dL (ref 6–23)
CO2: 27 mEq/L (ref 19–32)
Calcium: 9.4 mg/dL (ref 8.4–10.5)
Chloride: 102 mEq/L (ref 96–112)
Creatinine, Ser: 0.94 mg/dL (ref 0.40–1.50)
GFR: 78.18 mL/min (ref >60.00)
Glucose, Bld: 166 mg/dL — ABNORMAL HIGH (ref 70–99)
Potassium: 4.3 mEq/L (ref 3.5–5.1)
Sodium: 138 mEq/L (ref 135–145)
Total Bilirubin: 1.1 mg/dL (ref 0.2–1.2)
Total Protein: 6.7 g/dL (ref 6.0–8.3)

## 2020-10-09 LAB — TSH: TSH: 0.28 u[IU]/mL — ABNORMAL LOW (ref 0.35–4.50)

## 2020-10-09 LAB — HEMOGLOBIN A1C: Hgb A1c MFr Bld: 6.7 % — ABNORMAL HIGH (ref 4.6–6.5)

## 2020-10-09 MED ORDER — ERGOCALCIFEROL 1.25 MG (50000 UT) PO CAPS: 50000.0000 [IU] | ORAL_CAPSULE | ORAL | 3 refills | Status: DC

## 2020-10-09 NOTE — Progress Notes (Addendum)
Subjective:  Patient ID: Brett [name] Seen, male    DOB: 1941/03/18  Age: 80 y.o. MRN: [id number]  CC: The primary encounter diagnosis was Hypothyroidism due to acquired atrophy of thyroid. 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  HPI Brett [name] presents for follow up on type 2 DM complicated by hyperlipidemia and hypertension  This visit occurred during the SARS-CoV-2 public health emergency.  Safety protocols were in place, including screening questions prior to the visit, additional usage of staff PPE, and extensive cleaning of exam room while observing appropriate contact time as indicated for disinfecting solutions.   Hypertension:  He saw cardiology on April 7.  orthostasis was reported and addressed with lowering of telmisartan and metoprolol doses.  Symptoms have resolved.   Type 2 DM: He feels generally well, is exercising several times per week and checking blood sugars once daily at variable times.  BS have been under  110 fasting .Marland Kitchen  Denies any recent hypoglyemic events.  a1c was 7.2 in Feburary on glipizide.  Given his history of CAD, glipizide was stopped and he was prescribed metformin/invokanna.  Taking his medications as directed. noting loose bowels but no urgency,  And no urinary frequency.    Following a carbohydrate modified diet 6 days per week. Denies numbness, burning and tingling of extremities. Appetite is good.      Outpatient Medications Prior to Visit  Medication Sig Dispense Refill  . atorvastatin (LIPITOR) 80 MG tablet Take 1 tablet (80 mg total) by mouth daily. 90 tablet 3  . buPROPion (WELLBUTRIN XL) 150 MG 24 hr tablet TAKE 1 TABLET BY MOUTH EVERY DAY 90 tablet 1  . DULoxetine (CYMBALTA) 30 MG capsule TAKE 1 CAPSULE BY MOUTH EVERY  DAY 90 capsule 1  . ezetimibe (ZETIA) 10 MG tablet Take 1 tablet (10 mg total) by mouth daily. 90 tablet 3  . FREESTYLE LITE test strip CHECK BLOOD SUGAR DAILY AS DIRECTED 100 strip 5  . glucose monitoring kit (FREESTYLE) monitoring kit 1 each by Does not apply route as needed for other. For use daily to monitor diabetes.  Please include lancets .  Test once daily   E11.9 1 each 3  . INVOKAMET XR 50-1000 MG TB24 TAKE 2 TABLETS BY MOUTH DAILY 60 tablet 2  . Lancets (FREESTYLE) lancets Use as instructed 100 each 12  . metoprolol succinate (TOPROL-XL) 25 MG 24 hr tablet Take 0.5 tablets (12.5 mg total) by mouth daily. Take with or immediately following a meal. 45 tablet 3  . tamsulosin (FLOMAX) 0.4 MG CAPS capsule TAKE 1 CAPSULE (0.4 MG TOTAL) DAILY BY MOUTH. 90 capsule 1  . telmisartan (MICARDIS) 20 MG tablet Take 0.5 tablets (10 mg total) by mouth daily. 45 tablet 3  . levothyroxine (SYNTHROID) 112 MCG tablet Take 1 tablet (112 mcg total) by mouth daily. 90 tablet 3   No facility-administered medications prior to visit.    Review of Systems;  Patient denies headache, fevers, malaise, unintentional weight loss, skin rash, eye pain, sinus congestion and sinus pain, sore throat, dysphagia,  hemoptysis , cough, dyspnea, wheezing, chest pain, palpitations, orthopnea, edema, abdominal pain, nausea, melena, diarrhea, constipation, flank pain, dysuria, hematuria, urinary  Frequency, nocturia, numbness, tingling, seizures,  Focal weakness, Loss of consciousness,  Tremor, insomnia, depression, anxiety, and suicidal ideation.  Objective:  BP (!) 110/58 (BP Location: Left Arm, Patient Position: Sitting, Cuff Size: Normal)   Pulse 64   Temp (!) 96.6 F (35.9 C) (Temporal)   Resp 15   Ht _0  (1.803 m)   Wt 172 lb 6.4 oz (78.2 kg)   SpO2 96%   BMI 24.04 kg/m   BP Readings from Last 3 Encounters:  10/09/20 (!) 110/58  09/10/20 (!) 98/50  07/09/20 (!) 102/50    Wt Readings from Last 3  Encounters:  10/09/20 172 lb 6.4 oz (78.2 kg)  09/10/20 175 lb (79.4 kg)  07/09/20 190 lb (86.2 kg)    General appearance: alert, cooperative and appears stated age Ears: normal TM's and external ear canals both ears Throat: lips, mucosa, and tongue normal; teeth and gums normal Neck: no adenopathy, no carotid bruit, supple, symmetrical, trachea midline and thyroid not enlarged, symmetric, no tenderness/mass/nodules Back: symmetric, no curvature. ROM normal. No CVA tenderness. Lungs: clear to auscultation bilaterally Heart: regular rate and rhythm, S1, S2 normal, no murmur, click, rub or gallop Abdomen: soft, non-tender; bowel sounds normal; no masses,  no organomegaly Pulses: 2+ and symmetric Skin: Skin color, texture, turgor normal. No rashes or lesions Lymph nodes: Cervical, supraclavicular, and axillary nodes normal.  Lab Results  Component Value Date   HGBA1C 6.7 (H) 10/09/2020   HGBA1C 7.2 (H) 07/09/2020   HGBA1C 6.5 (A) 01/07/2020    Lab Results  Component Value Date   CREATININE 0.94 10/09/2020   CREATININE 0.98 07/09/2020   CREATININE 0.97 12/19/2019    Lab Results  Component Value Date   WBC 5.4 12/19/2019   HGB 13.2 12/19/2019   HCT 39.9 12/19/2019   PLT 183.0 12/19/2019   GLUCOSE 166 (H) 10/09/2020   CHOL 117 07/09/2020   TRIG 105.0 07/09/2020   HDL 50.80 07/09/2020   LDLDIRECT 193.0 05/18/2017   LDLCALC 45 07/09/2020   ALT 18 10/09/2020   AST 22 10/09/2020   NA 138 10/09/2020   K 4.3 10/09/2020   CL 102 10/09/2020   CREATININE 0.94 10/09/2020   BUN 21 10/09/2020   CO2 27 10/09/2020   TSH 0.28 (L) 10/09/2020   PSA 0.42 04/03/2018   HGBA1C 6.7 (H) 10/09/2020   MICROALBUR 0.7 07/09/2020    Ultrasound renal complete  Result Date: 03/12/2020 CLINICAL DATA:  History of kidney stone EXAM: RENAL / URINARY TRACT ULTRASOUND COMPLETE COMPARISON:  CT 12/31/2019 FINDINGS: Right Kidney: Renal measurements: 10.7 x 5.7 x 5.7 cm = volume: 184.1 mL. Echogenicity  within normal limits. No mass or hydronephrosis visualized. Left Kidney: Renal measurements: 11 x 5.6 x 5 cm = volume: 161.9 mL. Cortical echogenicity is normal. No hydronephrosis. Cyst in the mid to upper pole measuring 2.2 cm. Echogenic focus within the midpole measuring 7 mm, possible stone. Bladder: Appears normal for degree of bladder distention. Prevoid bladder volume 68.8 mL. Postvoid bladder volume 11.1 mL. Other: Fluid structure anterior to the bladder corresponds to reservoir for penile implant. IMPRESSION: 1. Negative for hydronephrosis. 2. Possible 7 mm stone in the left kidney Electronically Signed   By: Donavan Foil M.D.   On: 03/12/2020 16:58    Assessment & Plan:   Problem List Items Addressed This Visit      Unprioritized   Hypertension associated with type 2 diabetes mellitus ([place])    Diabetes is well controlled on Invokamet .   BP is well controlled on lesser doses of metoprolol and telmisartan due to orthostasis.    Continue asa ,  Statin and ARB  Lab Results  Component Value Date   HGBA1C 6.7 (H) 10/09/2020   Lab Results  Component Value Date   LABMICR See below: 01/30/2020   LABMICR See below: 01/02/2020   MICROALBUR 0.7 07/09/2020   MICROALBUR 1.0 06/15/2019           Relevant Orders   Hemoglobin A1c (Completed)   Comprehensive metabolic panel (Completed)   Hypothyroidism - Primary    Thyroid function is overactive on current dose of 112 mcg daily .  I have sent a lower dose of levothyroxine, 100 mcg  to  pharmacy and would like patient to change immediately use reduce current dose to 6 days per week until supply is used up.  Repeat TSH in  6 weeks is advised.        Relevant Orders   TSH (Completed)   Hyperlipidemia LDL goal <70    Well controlled on 80  Mg Lipitor and 10 mg zetia . Liver enzymes are normal , no changes today.  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 I am also having him maintain his glucose monitoring kit, freestyle, tamsulosin, FREESTYLE LITE, buPROPion, DULoxetine, telmisartan, metoprolol succinate, ezetimibe, atorvastatin, and Invokamet XR.  Meds ordered this encounter  Medications  . ergocalciferol (DRISDOL) 1.25 MG (50000 UT) capsule    Sig: Take 1 capsule (50,000 Units total) by mouth once a week.    Dispense:  4 capsule    Refill:  3    There are no discontinued medications.  Follow-up: Return in about 6 months (around 04/11/2021) for follow up diabetes.   Crecencio Mc, MD

## 2020-10-09 NOTE — Assessment & Plan Note (Addendum)
Diabetes is well controlled on Invokamet .   BP is well controlled on lesser doses of metoprolol and telmisartan due to orthostasis.    Continue asa ,  Statin and ARB  Lab Results  Component Value Date   HGBA1C 6.7 (H) 10/09/2020   Lab Results  Component Value Date   LABMICR See below: 01/30/2020   LABMICR See below: 01/02/2020   MICROALBUR 0.7 07/09/2020   MICROALBUR 1.0 06/15/2019

## 2020-10-09 NOTE — Patient Instructions (Signed)
Your vitamin D  Was low in February .Marland Kitchen  Have called in a megadose of Vit D to take once weekly for a total of 3 months  after you finish the weekly Vitamin D supplement, you should start taking an OTC  Vit D3 supplement 1000 units daily, November through April. .    Your thyroid was overactive in February.  We reduced your levothyroxine dose and are rechecking today.

## 2020-10-11 DIAGNOSIS — E083592 Diabetes mellitus due to underlying condition with proliferative diabetic retinopathy without macular edema, left eye: Secondary | ICD-10-CM | POA: Insufficient documentation

## 2020-10-11 NOTE — Assessment & Plan Note (Signed)
Well controlled on 80  Mg Lipitor and 10 mg zetia . Liver enzymes are normal , no changes today.  Lab Results  Component Value Date   CHOL 117 07/09/2020   HDL 50.80 07/09/2020   LDLCALC 45 07/09/2020   LDLDIRECT 193.0 05/18/2017   TRIG 105.0 07/09/2020   CHOLHDL 2 07/09/2020   Lab Results  Component Value Date   ALT 18 10/09/2020   AST 22 10/09/2020   ALKPHOS 48 10/09/2020   BILITOT 1.1 10/09/2020

## 2020-10-12 ENCOUNTER — Other Ambulatory Visit: Payer: Self-pay | Admitting: Internal Medicine

## 2020-10-12 DIAGNOSIS — E034 Atrophy of thyroid (acquired): Secondary | ICD-10-CM

## 2020-10-12 MED ORDER — LEVOTHYROXINE SODIUM 100 MCG PO TABS: 100.0000 ug | ORAL_TABLET | Freq: Every day | ORAL | 0 refills | Status: DC

## 2020-10-12 NOTE — Assessment & Plan Note (Signed)
Thyroid function is overactive on current dose of 112 mcg daily .  I have sent a lower dose of levothyroxine, 100 mcg  to  pharmacy and would like patient to change immediately use reduce current dose to 6 days per week until supply is used up.  Repeat TSH in  6 weeks is advised.

## 2020-10-12 NOTE — Assessment & Plan Note (Signed)
Thyroid function is overactive on current dose of 112 mcg daily .  I have sent a lower dose of levothyroxine, 100 mcg  to  pharmacy and would like patient to change immediately use reduce current dose to 6 days per week until supply is used up.  Repeat TSH in  6 weeks is advised.   

## 2020-10-13 ENCOUNTER — Telehealth: Payer: Self-pay | Admitting: Internal Medicine

## 2020-10-13 NOTE — Telephone Encounter (Signed)
Patient called and said his pharmacy did not receive prescription for this medication.

## 2020-11-05 ENCOUNTER — Other Ambulatory Visit: Payer: Self-pay | Admitting: Internal Medicine

## 2020-11-26 ENCOUNTER — Other Ambulatory Visit: Payer: PPO

## 2020-12-01 ENCOUNTER — Other Ambulatory Visit (INDEPENDENT_AMBULATORY_CARE_PROVIDER_SITE_OTHER): Payer: PPO

## 2020-12-01 ENCOUNTER — Other Ambulatory Visit: Payer: Self-pay

## 2020-12-01 DIAGNOSIS — E034 Atrophy of thyroid (acquired): Secondary | ICD-10-CM

## 2020-12-01 LAB — TSH: TSH: 0.3 u[IU]/mL — ABNORMAL LOW (ref 0.35–4.50)

## 2020-12-02 ENCOUNTER — Telehealth: Payer: Self-pay

## 2020-12-02 NOTE — Telephone Encounter (Signed)
See result note messasge

## 2020-12-02 NOTE — Telephone Encounter (Signed)
LMTCB in regards to lab results.  

## 2020-12-02 NOTE — Telephone Encounter (Signed)
Pt called returning your call 

## 2020-12-03 MED ORDER — LEVOTHYROXINE SODIUM 75 MCG PO TABS: 75.0000 ug | ORAL_TABLET | Freq: Every day | ORAL | 0 refills | Status: DC

## 2020-12-03 NOTE — Addendum Note (Signed)
Addended by: Crecencio Mc on: 12/03/2020 12:32 PM   Modules accepted: Orders

## 2020-12-03 NOTE — Telephone Encounter (Signed)
Pt called about labs and medication

## 2020-12-03 NOTE — Telephone Encounter (Signed)
See result note message 

## 2020-12-03 NOTE — Assessment & Plan Note (Signed)
TSH remains suppressed on 100 mc daily dose.  Stopping med fo 5 days.  Resume at 75 mg dose starting July 5  Lab Results  Component Value Date   TSH 0.30 (L) 12/01/2020

## 2020-12-29 ENCOUNTER — Telehealth: Payer: Self-pay | Admitting: Internal Medicine

## 2020-12-29 NOTE — Telephone Encounter (Signed)
Patient is requesting referral to [facility], Dr Gabriel Carina.

## 2020-12-29 NOTE — Telephone Encounter (Signed)
Patient is requesting to be referred to Dr Gabriel Carina for his thyroid.

## 2020-12-30 ENCOUNTER — Other Ambulatory Visit: Payer: Self-pay | Admitting: Internal Medicine

## 2020-12-30 DIAGNOSIS — E034 Atrophy of thyroid (acquired): Secondary | ICD-10-CM

## 2020-12-30 NOTE — Telephone Encounter (Signed)
Pt called to speak with you about referral. Please advise

## 2020-12-30 NOTE — Telephone Encounter (Signed)
Spoke with pt and informed him that the referral has been placed. Scheduled pt for a lab appt next week.

## 2020-12-30 NOTE — Telephone Encounter (Signed)
Called pt back and he wanted to know if the referral went through. I explained to pt that the referral has been submitted and the process that takes place. Also let pt know to give our office a call in a week if he has not heard from the endocrinologist office about an appointment. Pt gave a verbal understanding.

## 2020-12-30 NOTE — Telephone Encounter (Signed)
LMTCB

## 2020-12-31 ENCOUNTER — Telehealth: Payer: Self-pay | Admitting: Internal Medicine

## 2020-12-31 NOTE — Telephone Encounter (Signed)
Patient is returning your call to schedule a CT scan.Patient is unsure why he needs a CT scan,please advise.

## 2021-01-01 NOTE — Telephone Encounter (Signed)
Patient returned phone call to schedule CT scan.

## 2021-01-07 ENCOUNTER — Other Ambulatory Visit: Payer: Self-pay

## 2021-01-07 ENCOUNTER — Other Ambulatory Visit (INDEPENDENT_AMBULATORY_CARE_PROVIDER_SITE_OTHER): Payer: PPO

## 2021-01-07 DIAGNOSIS — E034 Atrophy of thyroid (acquired): Secondary | ICD-10-CM

## 2021-01-07 LAB — TSH: TSH: 3.13 u[IU]/mL (ref 0.35–5.50)

## 2021-01-08 LAB — THYROGLOBULIN LEVEL: Thyroglobulin: <0.1 ng/mL — ABNORMAL LOW

## 2021-01-08 LAB — THYROID PEROXIDASE ANTIBODY: Thyroperoxidase Ab SerPl-aCnc: 284 IU/mL — ABNORMAL HIGH (ref <9)

## 2021-01-08 LAB — THYROGLOBULIN ANTIBODY: Thyroglobulin Ab: >1000 IU/mL — ABNORMAL HIGH (ref <=1)

## 2021-01-09 NOTE — Assessment & Plan Note (Addendum)
TSH is no longer suppressed but no  in normal range on 75 mcg daily (lowered in late June). TPO antibody is elevated.  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  Endocrinology referral in progress

## 2021-01-20 ENCOUNTER — Encounter: Payer: Self-pay | Admitting: *Deleted

## 2021-01-20 ENCOUNTER — Telehealth: Payer: Self-pay | Admitting: *Deleted

## 2021-01-20 NOTE — Telephone Encounter (Signed)
Mychart message sent & lab appt will be cancelled.

## 2021-01-20 NOTE — Telephone Encounter (Signed)
Please place future orders for lab appt.    Appt notes states that its for a TSH, pt had TSH drawn on 01/07/21.

## 2021-01-22 ENCOUNTER — Other Ambulatory Visit: Payer: PPO

## 2021-01-24 ENCOUNTER — Other Ambulatory Visit: Payer: Self-pay | Admitting: Internal Medicine

## 2021-02-02 ENCOUNTER — Telehealth: Payer: Self-pay | Admitting: Internal Medicine

## 2021-02-02 NOTE — Telephone Encounter (Signed)
InvokaMet is costing the Patient $138 and sometimes this medication is on back order.   Patient requesting a new medication. Please advise

## 2021-02-03 ENCOUNTER — Telehealth: Payer: Self-pay

## 2021-02-03 ENCOUNTER — Other Ambulatory Visit: Payer: Self-pay | Admitting: Internal Medicine

## 2021-02-03 DIAGNOSIS — E1159 Type 2 diabetes mellitus with other circulatory complications: Secondary | ICD-10-CM

## 2021-02-03 MED ORDER — EMPAGLIFLOZIN 10 MG PO TABS: 10.0000 mg | ORAL_TABLET | Freq: Every day | ORAL | 5 refills | Status: DC

## 2021-02-03 MED ORDER — METFORMIN HCL ER (MOD) 1000 MG PO TB24: 1000.0000 mg | ORAL_TABLET | Freq: Every day | ORAL | 5 refills | Status: DC

## 2021-02-03 NOTE — Telephone Encounter (Signed)
LMTCB

## 2021-02-03 NOTE — Telephone Encounter (Signed)
He may be in the donut hole because per Catie , HTA covers iinvokamet.   I have split them up to see if the cost is better

## 2021-02-03 NOTE — Telephone Encounter (Signed)
Pt returned your call.  

## 2021-02-03 NOTE — Telephone Encounter (Signed)
Spoke with pt and he stated that the Jardiance by itself is $140.

## 2021-02-03 NOTE — Chronic Care Management (AMB) (Signed)
  Chronic Care Management   Note  02/03/2021 Name: Brett [name] MRN: [id number] DOB: 04-22-1941  Brett [name] is a 80 y.o. year old male who is a primary care patient of Crecencio Mc, MD. I reached out to [place] by phone today in response to a referral sent by Brett [name]'s PCP, Crecencio Mc, MD      Brett [name] was given information about Chronic Care Management services today including:  CCM service includes personalized support from designated clinical staff supervised by his physician, including individualized plan of care and coordination with other care providers 24/7 contact phone numbers for assistance for urgent and routine care needs. Service will only be billed when office clinical staff spend 20 minutes or more in a month to coordinate care. Only one practitioner may furnish and bill the service in a calendar month. The patient may stop CCM services at any time (effective at the end of the month) by phone call to the office staff. The patient will be responsible for cost sharing (co-pay) of up to 20% of the service fee (after annual deductible is met).  Patient agreed to services and verbal consent obtained.   Follow up plan: Telephone appointment with care management team member scheduled for:02/12/2021  Brett [name], Brett [name], Brett [name], Brett [name] [id number] Direct Dial: [phone] [email or web address]: [email or web address]

## 2021-02-05 NOTE — Telephone Encounter (Signed)
Pt is aware and already scheduled an appt.

## 2021-02-12 ENCOUNTER — Telehealth: Payer: PPO

## 2021-02-12 ENCOUNTER — Telehealth: Payer: Self-pay | Admitting: Pharmacist

## 2021-02-12 ENCOUNTER — Other Ambulatory Visit: Payer: Self-pay | Admitting: Internal Medicine

## 2021-02-12 NOTE — Telephone Encounter (Signed)
Patient returned Catie's phone call

## 2021-02-12 NOTE — Telephone Encounter (Signed)
  Chronic Care Management   Note  02/12/2021 Name: Brett [name] MRN: [id number] DOB: [birth year]   Attempted to contact patient for scheduled appointment for medication management support. Left HIPAA compliant message for patient to return my call at their convenience.    Plan: - If I do not hear back from the patient by end of business today, will collaborate with Care Guide to outreach to schedule follow up with me   Catie Darnelle Maffucci, PharmD, [place], [place] Pharmacist [employer] at [employer] [phone]

## 2021-02-17 ENCOUNTER — Ambulatory Visit (INDEPENDENT_AMBULATORY_CARE_PROVIDER_SITE_OTHER): Payer: PPO | Admitting: Pharmacist

## 2021-02-17 DIAGNOSIS — E1159 Type 2 diabetes mellitus with other circulatory complications: Secondary | ICD-10-CM

## 2021-02-17 DIAGNOSIS — I152 Hypertension secondary to endocrine disorders: Secondary | ICD-10-CM

## 2021-02-17 DIAGNOSIS — I25708 Atherosclerosis of coronary artery bypass graft(s), unspecified, with other forms of angina pectoris: Secondary | ICD-10-CM

## 2021-02-17 DIAGNOSIS — E034 Atrophy of thyroid (acquired): Secondary | ICD-10-CM

## 2021-02-17 DIAGNOSIS — F418 Other specified anxiety disorders: Secondary | ICD-10-CM

## 2021-02-17 DIAGNOSIS — E559 Vitamin D deficiency, unspecified: Secondary | ICD-10-CM

## 2021-02-17 DIAGNOSIS — E119 Type 2 diabetes mellitus without complications: Secondary | ICD-10-CM

## 2021-02-17 NOTE — Patient Instructions (Signed)
[name],   It was great meeting you today!  Call me if the [facility] is unable to [facility] the Invokamet (or the separate parts - Invokana and metformin).   We recommend you get the influenza vaccine for this season.   We recommend you get the updated bivalent COVID-19 booster, at least 2 months after any prior doses. You can find pharmacies that have this formulation in stock at [email or web address].   Take care!  Brett [name], PharmD [phone]  Visit Information   PATIENT GOALS:   Goals Addressed               This Visit's Progress     Patient Stated     Medication Monitoring (pt-stated)        Patient Goals/Self-Care Activities Over the next 90 days, patient will:  - take medications as prescribed check glucose periodically, document, and provide at future appointments collaborate with provider on medication access solutions        Consent to CCM Services: Brett [name] was given information about Chronic Care Management services including:  CCM service includes personalized support from designated clinical staff supervised by his physician, including individualized plan of care and coordination with other care providers 24/7 contact phone numbers for assistance for urgent and routine care needs. Service will only be billed when office clinical staff spend 20 minutes or more in a month to coordinate care. Only one practitioner may furnish and bill the service in a calendar month. The patient may stop CCM services at any time (effective at the end of the month) by phone call to the office staff. The patient will be responsible for cost sharing (co-pay) of up to 20% of the service fee (after annual deductible is met).  Patient agreed to services and verbal consent obtained.   The patient verbalized understanding of instructions, educational materials, and care plan provided today and agreed to receive a mailed copy of patient instructions, educational materials, and  care plan.   Plan: Telephone follow up appointment with care management team member scheduled for:  if patient unable to access medication through the [place], PharmD, [place], CPP Clinical Pharmacist [place] [facility] [place]  CLINICAL CARE PLAN: Patient Care Plan: Medication Monitoring     Problem Identified: Diabetes, CAD, HTN      Long-Range Goal: Medication Management   Start Date: 02/17/2021  This Visit's Progress: On track  Priority: High  Note:   Current Barriers:  Unable to independently afford treatment regimen  Pharmacist Clinical Goal(s):  Over the next 90 days, patient will verbalize ability to afford treatment regimen through collaboration with PharmD and provider.   Interventions: 1:1 collaboration with Crecencio Mc, MD regarding development and update of comprehensive plan of care as evidenced by provider attestation and co-signature Inter-disciplinary care team collaboration (see longitudinal plan of care) Comprehensive medication review performed; medication list updated in electronic medical record  Health Maintenance Yearly diabetic eye exam: up to date Yearly diabetic foot exam: due- encourage at next PCP visit Urine microalbumin: up to date Yearly influenza vaccination: due - recommend yearly vaccine Td/Tdap vaccination: due Pneumonia vaccination: up to date COVID vaccinations: due - recommend bivalent booster Shingrix vaccinations: up to date Colonoscopy: up to date  Diabetes: Controlled; current treatment: Invokamet XR 50/1000 mg 2 tablets daily;  Current glucose readings: fasting glucose: 90s,  Denies hypoglycemic symptoms Reports copay for Invokamet has become more expensive, likely due to Medicare Coverage Gap. Reports he reached out to the  [facility] to see about coverage through them. Reports he has ~ 50 tablets left As patient has VA coverage, he likely will be denied for Patient Assistance. Discussed that if  he has not heard back from the [place] by the time he runs out of Invokamet, he will call me. We have samples of Synjardy, a relatively equivalent dose would be Synjardy XR 10/998 mg 2 tablets daily.   Hypertension in the setting of hx CAD: Controlled; current treatment: telmisartan 10 mg daily (splitting 20 mg tablet), metoprolol succinate 12.5 mg daily Current home readings: not checking at home due to control in office Recommended to continue current regimen at this time  Hyperlipidemia and secondary ASCVD prevention: Controlled; current treatment: atorvastatin 80 mg daily, ezetimibe 10 mg daily;  Recommended to continue current regimen at this time  Depression/Anxiety: Controlled per patient report; current treatment: duloxetine 30 mg daily, bupropion XL 150 mg daily -though he does not have this with his bottles upon review today. Last fill for 90 day supply in May.  Recommended to contact pharmacy for bupropion XL refill.   Hypothyroidism: Controlled per last lab; current treatment: levothyroxine 75 mcg daily Recommended to continue current regimen at this time  BPH: Controlled per patient report; current treatment: tamsulosin 0.4 mg daily Recommended to continue current regimen at this time  Supplements: Reports he completed 3 months of weekly Vitamin D2 50,000 units and now is taking daily OTC Vitamin D3 1000 units. Continue current regimen at this time   Patient Goals/Self-Care Activities Over the next 90 days, patient will:  - take medications as prescribed check glucose periodically, document, and provide at future appointments collaborate with provider on medication access solutions  Follow Up Plan: Telephone follow up appointment with care management team member scheduled for: patient will call me if further intervention is needed

## 2021-02-17 NOTE — Chronic Care Management (AMB) (Signed)
Chronic Care Management Pharmacy Note  02/17/2021 Name:  Brett [name] MRN:  [id number] DOB:  June 13, 1940  Subjective: Brett [name] is an 79 y.o. year old male who is a primary patient of Tullo, Aris Everts, MD.  The CCM team was consulted for assistance with disease management and care coordination needs.    Engaged with patient by telephone for initial visit in response to provider referral for pharmacy case management and/or care coordination services.   Consent to Services:  The patient was given the following information about Chronic Care Management services today, agreed to services, and gave verbal consent: 1. CCM service includes personalized support from designated clinical staff supervised by the primary care provider, including individualized plan of care and coordination with other care providers 2. 24/7 contact phone numbers for assistance for urgent and routine care needs. 3. Service will only be billed when office clinical staff spend 20 minutes or more in a month to coordinate care. 4. Only one practitioner may furnish and bill the service in a calendar month. 5.The patient may stop CCM services at any time (effective at the end of the month) by phone call to the office staff. 6. The patient will be responsible for cost sharing (co-pay) of up to 20% of the service fee (after annual deductible is met). Patient agreed to services and consent obtained.  Patient Care Team: Crecencio Mc, MD as PCP - General (Internal Medicine) Minna Merritts, MD as Consulting Physician (Cardiology) De Hollingshead, RPH-CPP (Pharmacist)  Objective:  Lab Results  Component Value Date   CREATININE 0.94 10/09/2020   CREATININE 0.98 07/09/2020   CREATININE 0.97 12/19/2019    Lab Results  Component Value Date   HGBA1C 6.7 (H) 10/09/2020   Last diabetic Eye exam:  Lab Results  Component Value Date/Time   HMDIABEYEEXA No Retinopathy 05/29/2020 12:00 AM    Last diabetic Foot exam:  Lab  Results  Component Value Date/Time   HMDIABFOOTEX normal 12/19/2014 12:00 AM        Component Value Date/Time   CHOL 117 07/09/2020 0955   CHOL 150 01/17/2015 0825   TRIG 105.0 07/09/2020 0955   HDL 50.80 07/09/2020 0955   HDL 51 01/17/2015 0825   CHOLHDL 2 07/09/2020 0955   VLDL 21.0 07/09/2020 0955   LDLCALC 45 07/09/2020 0955   LDLCALC 83 01/17/2015 0825   LDLDIRECT 193.0 05/18/2017 1205    Hepatic Function Latest Ref Rng & Units 10/09/2020 07/09/2020 12/19/2019  Total Protein 6.0 - 8.3 g/dL 6.7 6.9 6.7  Albumin 3.5 - 5.2 g/dL 4.4 4.3 4.3  AST 0 - 37 U/L 22 23 21   ALT 0 - 53 U/L 18 25 21   Alk Phosphatase 39 - 117 U/L 48 63 63  Total Bilirubin 0.2 - 1.2 mg/dL 1.1 0.9 0.9  Bilirubin, Direct 0.00 - 0.40 mg/dL - - -    Lab Results  Component Value Date/Time   TSH 3.13 01/07/2021 11:22 AM   TSH 0.30 (L) 12/01/2020 09:54 AM    CBC Latest Ref Rng & Units 12/19/2019 09/16/2016 11/22/2013  WBC 4.0 - 10.5 K/uL 5.4 5.6 5.6  Hemoglobin 13.0 - 17.0 g/dL 13.2 14.3 13.8  Hematocrit 39.0 - 52.0 % 39.9 - 41.3  Platelets 150.0 - 400.0 K/uL 183.0 189 265.0    Lab Results  Component Value Date/Time   VD25OH 21.61 (L) 07/09/2020 09:55 AM   VD25OH 27.85 (L) 05/06/2016 10:40 AM    Clinical ASCVD: Yes  The ASCVD Risk score (  Arnett DK, et al., 2019) failed to calculate for the following reasons:   The valid total cholesterol range is 130 to 320 mg/dL     Social History   Tobacco Use  Smoking Status Former   Types: Cigars   Quit date: 10/04/1981   Years since quitting: 39.4  Smokeless Tobacco Never   BP Readings from Last 3 Encounters:  10/09/20 (!) 110/58  09/10/20 (!) 98/50  07/09/20 (!) 102/50   Pulse Readings from Last 3 Encounters:  10/09/20 64  09/10/20 (!) 58  07/09/20 61   Wt Readings from Last 3 Encounters:  10/09/20 172 lb 6.4 oz (78.2 kg)  09/10/20 175 lb (79.4 kg)  07/09/20 190 lb (86.2 kg)    Assessment: Review of patient past medical history, allergies,  medications, health status, including review of consultants reports, laboratory and other test data, was performed as part of comprehensive evaluation and provision of chronic care management services.   SDOH:  (Social Determinants of Health) assessments and interventions performed:  SDOH Interventions    Flowsheet Row Most Recent Value  SDOH Interventions   Financial Strain Interventions Other (Comment)  [patient contacting [place]  Allergies  Allergen Reactions   Simvastatin Other (See Comments)   Eszopiclone Rash    Medications Reviewed Today     Reviewed by De Hollingshead, RPH-CPP (Pharmacist) on 02/17/21 at 1402  Med List Status: <None>   Medication Order Taking? Sig Documenting Provider Last Dose Status Informant  atorvastatin (LIPITOR) 80 MG tablet [id number] Yes Take 1 tablet (80 mg total) by mouth daily. Minna Merritts, MD Taking Active   buPROPion (WELLBUTRIN XL) 150 MG 24 hr tablet [id number] No TAKE 1 TABLET BY MOUTH EVERY DAY  Patient not taking: Reported on 02/17/2021   Crecencio Mc, MD Not Taking Active   Canagliflozin-metFORMIN HCl (INVOKAMET) 50-1000 MG TABS [id number] Yes Take 2 tablets by mouth daily. [provider] Taking Active   DULoxetine (CYMBALTA) 30 MG capsule [id number] Yes TAKE 1 CAPSULE BY MOUTH EVERY DAY Crecencio Mc, MD Taking Active   empagliflozin (JARDIANCE) 10 MG TABS tablet [id number] No Take 1 tablet (10 mg total) by mouth daily before breakfast.  Patient not taking: Reported on 02/17/2021   Crecencio Mc, MD Not Taking Active            Med Note De Hollingshead   Tue Feb 17, 2021  1:49 PM) Completing Invokamet supply  ezetimibe (ZETIA) 10 MG tablet [id number] Yes Take 1 tablet (10 mg total) by mouth daily. Minna Merritts, MD Taking Active   FREESTYLE LITE test strip [id number] Yes CHECK BLOOD SUGAR DAILY AS DIRECTED Crecencio Mc, MD Taking Active   glucose monitoring kit (FREESTYLE) monitoring kit  [id number] Yes 1 each by Does not apply route as needed for other. For use daily to monitor diabetes.  Please include lancets .  Test once daily   E11.9 Crecencio Mc, MD Taking Active Self  Lancets (FREESTYLE) lancets [id number] Yes Use as instructed Crecencio Mc, MD Taking Active Self  levothyroxine (SYNTHROID) 75 MCG tablet [id number] Yes TAKE 1 TABLET (75 MCG TOTAL) BY MOUTH DAILY. DOSE REDUCED AGAIN Crecencio Mc, MD Taking Active   metoprolol succinate (TOPROL-XL) 25 MG 24 hr tablet [id number] Yes Take 0.5 tablets (12.5 mg total) by mouth daily. Take with or immediately following a meal. Gollan, Kathlene November, MD Taking Active   tamsulosin (FLOMAX) 0.4 MG CAPS  capsule [id number] Yes TAKE 1 CAPSULE (0.4 MG TOTAL) DAILY BY MOUTH. Crecencio Mc, MD Taking Active   telmisartan (MICARDIS) 20 MG tablet [id number] Yes Take 0.5 tablets (10 mg total) by mouth daily. 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 associated with diabetes mellitus due to underlying condition ([place]) 10/11/2020   Chronic hip pain, left 07/10/2020   Aortic atherosclerosis ([place]) 07/10/2020   Bone spur 01/30/2020   Change in voice 11/06/2016   Hx of adenomatous colonic polyps 10/21/2016   Insomnia 06/08/2015   Benign prostatic hyperplasia with nocturia 12/22/2014   CAD (coronary artery disease) 10/15/2013   Depression with anxiety 08/07/2012   Pulmonary nodule seen on imaging study    H/O renal calculi    Hyperlipidemia LDL goal <70 01/04/2012   Routine general medical examination at a health care facility 01/04/2012   Hypertension associated with type 2 diabetes mellitus ([place]) 07/09/2011   Hypothyroidism 07/09/2011     Immunization History  Administered Date(s) Administered   Fluad Quad(high Dose 65+) 03/21/2020   Influenza Split 03/16/2013, 02/11/2015   Influenza Whole 02/14/2012   Influenza-Unspecified 02/05/2013, 02/11/2015, 02/27/2016, 02/20/2018, 02/20/2019   PFIZER(Purple Top)SARS-COV-2 Vaccination 06/12/2019, 07/03/2019, 01/20/2020, 09/30/2020   Pneumococcal Conjugate-13 03/16/2013   Pneumococcal Polysaccharide-23 07/08/2009, 02/02/2013, 06/25/2015   Tdap 02/28/2012, 02/02/2013, 03/16/2013   Zoster Recombinat (Shingrix) 04/06/2018, 08/29/2018    Conditions to be addressed/monitored: CAD, HTN, HLD, and DMII  Care Plan : Medication Monitoring  Updates made by De Hollingshead, RPH-CPP since 02/17/2021 12:00 AM     Problem: Diabetes, CAD, HTN      Long-Range Goal: Medication Management   Start Date: 02/17/2021  This Visit's Progress: On track  Priority: High  Note:   Current Barriers:  Unable to independently afford treatment regimen  Pharmacist Clinical Goal(s):  Over the next 90 days, patient will verbalize ability to afford treatment regimen through collaboration with PharmD and provider.   Interventions: 1:1 collaboration with Crecencio Mc, MD regarding development and update of comprehensive plan of care as evidenced by provider attestation and co-signature Inter-disciplinary care team collaboration (see longitudinal plan of care) Comprehensive medication review performed; medication list updated in electronic medical record  Health Maintenance Yearly diabetic eye exam: up to date Yearly diabetic foot exam: due- encourage at next PCP visit Urine microalbumin: up to date Yearly influenza vaccination: due - recommend yearly vaccine Td/Tdap vaccination: due Pneumonia vaccination: up to date COVID vaccinations: due - recommend bivalent booster Shingrix vaccinations: up to date Colonoscopy: up to date  Diabetes: Controlled; current treatment: Invokamet XR 50/1000 mg  2 tablets daily;  Current glucose readings: fasting glucose: 90s,  Denies hypoglycemic symptoms Reports copay for Invokamet has become more expensive, likely due to Medicare Coverage Gap. Reports he reached out to the [place] to see about coverage through them. Reports he has ~ 50 tablets left As patient has VA coverage, he likely will be denied for Patient Assistance. Discussed that if he has not heard back from the [place] by the time he runs out of Invokamet, he will call me. We have samples of Synjardy, a  relatively equivalent dose would be Synjardy XR 10/998 mg 2 tablets daily.   Hypertension in the setting of hx CAD: Controlled; current treatment: telmisartan 10 mg daily (splitting 20 mg tablet), metoprolol succinate 12.5 mg daily Current home readings: not checking at home due to control in office Recommended to continue current regimen at this time  Hyperlipidemia and secondary ASCVD prevention: Controlled; current treatment: atorvastatin 80 mg daily, ezetimibe 10 mg daily;  Recommended to continue current regimen at this time  Depression/Anxiety: Controlled per patient report; current treatment: duloxetine 30 mg daily, bupropion XL 150 mg daily -though he does not have this with his bottles upon review today. Last fill for 90 day supply in May.  Recommended to contact pharmacy for bupropion XL refill.   Hypothyroidism: Controlled per last lab; current treatment: levothyroxine 75 mcg daily Recommended to continue current regimen at this time  BPH: Controlled per patient report; current treatment: tamsulosin 0.4 mg daily Recommended to continue current regimen at this time  Supplements: Reports he completed 3 months of weekly Vitamin D2 50,000 units and now is taking daily OTC Vitamin D3 1000 units. Continue current regimen at this time   Patient Goals/Self-Care Activities Over the next 90 days, patient will:  - take medications as prescribed check glucose periodically, document, and  provide at future appointments collaborate with provider on medication access solutions  Follow Up Plan: Telephone follow up appointment with care management team member scheduled for: patient will call me if further intervention is needed      Medication Assistance: None required.  Patient affirms current coverage meets needs.  Patient's [facility] pharmacy [facility]  [id number] [place] [place] [place]Phone: [phone]: [phone] [id number] [place], [place] [address] [place]: [phone]: [id number] [place] [place]one: [phone]: [id number] Rabon[address] [place]one: [phone]: [phone] GreenC [place]one: [phone]: [phone] Up:  Patient agrees to Care Plan and Follow-up.  Plan: Telephone follow up appointment with care management team member scheduled for:  if patient unable to access medication through the [facility] [place]harmD, BCAAlvordPP[place] Centerinical Pharmacist [phone]

## 2021-03-06 DIAGNOSIS — F418 Other specified anxiety disorders: Secondary | ICD-10-CM

## 2021-03-06 DIAGNOSIS — E1159 Type 2 diabetes mellitus with other circulatory complications: Secondary | ICD-10-CM | POA: Diagnosis not present

## 2021-03-06 DIAGNOSIS — E119 Type 2 diabetes mellitus without complications: Secondary | ICD-10-CM | POA: Diagnosis not present

## 2021-03-06 DIAGNOSIS — I152 Hypertension secondary to endocrine disorders: Secondary | ICD-10-CM

## 2021-03-06 DIAGNOSIS — E034 Atrophy of thyroid (acquired): Secondary | ICD-10-CM | POA: Diagnosis not present

## 2021-03-06 DIAGNOSIS — I25708 Atherosclerosis of coronary artery bypass graft(s), unspecified, with other forms of angina pectoris: Secondary | ICD-10-CM

## 2021-04-02 DIAGNOSIS — D2261 Melanocytic nevi of right upper limb, including shoulder: Secondary | ICD-10-CM | POA: Diagnosis not present

## 2021-04-02 DIAGNOSIS — D2271 Melanocytic nevi of right lower limb, including hip: Secondary | ICD-10-CM | POA: Diagnosis not present

## 2021-04-02 DIAGNOSIS — D225 Melanocytic nevi of trunk: Secondary | ICD-10-CM | POA: Diagnosis not present

## 2021-04-02 DIAGNOSIS — X32XXXA Exposure to sunlight, initial encounter: Secondary | ICD-10-CM | POA: Diagnosis not present

## 2021-04-02 DIAGNOSIS — D2272 Melanocytic nevi of left lower limb, including hip: Secondary | ICD-10-CM | POA: Diagnosis not present

## 2021-04-02 DIAGNOSIS — L821 Other seborrheic keratosis: Secondary | ICD-10-CM | POA: Diagnosis not present

## 2021-04-02 DIAGNOSIS — L570 Actinic keratosis: Secondary | ICD-10-CM | POA: Diagnosis not present

## 2021-04-02 DIAGNOSIS — L304 Erythema intertrigo: Secondary | ICD-10-CM | POA: Diagnosis not present

## 2021-04-02 DIAGNOSIS — D2262 Melanocytic nevi of left upper limb, including shoulder: Secondary | ICD-10-CM | POA: Diagnosis not present

## 2021-04-22 ENCOUNTER — Other Ambulatory Visit: Payer: Self-pay | Admitting: Internal Medicine

## 2021-05-06 ENCOUNTER — Other Ambulatory Visit: Payer: Self-pay | Admitting: Internal Medicine

## 2021-05-15 ENCOUNTER — Telehealth: Payer: Self-pay | Admitting: Internal Medicine

## 2021-05-15 ENCOUNTER — Other Ambulatory Visit: Payer: Self-pay | Admitting: Internal Medicine

## 2021-05-15 DIAGNOSIS — U071 COVID-19: Secondary | ICD-10-CM

## 2021-05-15 MED ORDER — MOLNUPIRAVIR EUA 200MG CAPSULE: 4.0000 | ORAL_CAPSULE | Freq: Two times a day (BID) | ORAL | 0 refills | Status: DC

## 2021-05-15 NOTE — Telephone Encounter (Signed)
Pt called in stating that he did an at home Covid test. Pt stated that the at home Covid test came back positive. Pt is requesting for someone to prescribe him medication for Covid. Advise Pt that he may need to schedule an appointment for medication to be prescribe.   Pt stated that he would like to change his medication (Canagliflozin-metFORMIN HCl (INVOKAMET) 50-1000 MG TABS) to (glipizide). Pt is requesting callback

## 2021-05-15 NOTE — Telephone Encounter (Signed)
Pt tested positive for covid today and symptoms started yesterday. Symptoms are sore throat, low grade fever, cough, no congestion, no chills, no body aches. Dr. Derrel Nip has prescribed pt the antiviral and pt is aware. Pt would also like to see if his Inkavomet can be change to glipizide.

## 2021-05-18 NOTE — Telephone Encounter (Signed)
Pt has been scheduled.  °

## 2021-05-19 ENCOUNTER — Telehealth: Payer: Self-pay | Admitting: Pharmacist

## 2021-05-19 ENCOUNTER — Ambulatory Visit (INDEPENDENT_AMBULATORY_CARE_PROVIDER_SITE_OTHER): Payer: PPO | Admitting: Pharmacist

## 2021-05-19 DIAGNOSIS — E119 Type 2 diabetes mellitus without complications: Secondary | ICD-10-CM

## 2021-05-19 DIAGNOSIS — I25708 Atherosclerosis of coronary artery bypass graft(s), unspecified, with other forms of angina pectoris: Secondary | ICD-10-CM

## 2021-05-19 DIAGNOSIS — E039 Hypothyroidism, unspecified: Secondary | ICD-10-CM

## 2021-05-19 DIAGNOSIS — E1159 Type 2 diabetes mellitus with other circulatory complications: Secondary | ICD-10-CM

## 2021-05-19 MED ORDER — EMPAGLIFLOZIN 10 MG PO TABS: 10.0000 mg | ORAL_TABLET | Freq: Every day | ORAL | 1 refills | Status: DC

## 2021-05-19 NOTE — Addendum Note (Signed)
Addended by: Crecencio Mc on: 05/19/2021 03:38 PM   Modules accepted: Orders

## 2021-05-19 NOTE — Patient Instructions (Signed)
Brett [name],   It was great talking with you today.   Stay off [place]. Start Jardiance 10 mg daily. Take this medication in the morning, as it can cause more frequent urination with more sugar in the urine.   I have sent a refill to the [facility]. Call them this week to see if they can fill a prescription from a [facility] provider, or if you need to schedule a visit with your [facility] doctor for them to prescribe this medication, if tolerated.   We recommend you get the influenza vaccine for this season.   We recommend you get the updated bivalent COVID-19 booster, at least 2 months after any prior doses. You may consider delaying a booster dose by 3 months from a prior episode of COVID-19 per the CDC.   You can find pharmacies that have this formulation in stock at [email or web address].   Take care,   Catie Darnelle Maffucci, PharmD  Visit Information  Following are the goals we discussed today:  Patient Goals/Self-Care Activities Over the next 90 days, patient will:  - take medications as prescribed check glucose periodically, document, and provide at future appointments collaborate with provider on medication access solutions        Plan: Telephone follow up appointment with care management team member scheduled for:  3 months (PCP in 6 weeks)   Catie Darnelle Maffucci, PharmD, [place], CPP Clinical Pharmacist [place] at [facility] [phone]     Please call the care guide team at [phone] if you need to cancel or reschedule your appointment.   Patient verbalizes understanding of instructions provided today and agrees to view in [place].

## 2021-05-19 NOTE — Chronic Care Management (AMB) (Signed)
Chronic Care Management CCM Pharmacy Note  05/19/2021 Name:  Brett [name] MRN:  [id number] DOB:  1940-07-27  Summary: - Patient reported intolerance to Invokana.   Recommendations/Changes made from today's visit: - Start Jardiance 10 mg daily. Sending to [facility]. Providing sample for patient to trial while determining VA access  Subjective: Brett [name] is an 80 y.o. year old male who is a primary patient of Tullo, Aris Everts, MD.  The CCM team was consulted for assistance with disease management and care coordination needs.    Engaged with patient by telephone for follow up visit for pharmacy case management and/or care coordination services.   Objective:  Medications Reviewed Today     Reviewed by De Hollingshead, RPH-CPP (Pharmacist) on 02/17/21 at 1402  Med List Status: <None>   Medication Order Taking? Sig Documenting Provider Last Dose Status Informant  atorvastatin (LIPITOR) 80 MG tablet [id number] Yes Take 1 tablet (80 mg total) by mouth daily. Minna Merritts, MD Taking Active   buPROPion (WELLBUTRIN XL) 150 MG 24 hr tablet [id number] No TAKE 1 TABLET BY MOUTH EVERY DAY  Patient not taking: Reported on 02/17/2021   Crecencio Mc, MD Not Taking Active   Canagliflozin-metFORMIN HCl (INVOKAMET) 50-1000 MG TABS [id number] Yes Take 2 tablets by mouth daily. [provider] Taking Active   DULoxetine (CYMBALTA) 30 MG capsule [id number] Yes TAKE 1 CAPSULE BY MOUTH EVERY DAY Crecencio Mc, MD Taking Active   empagliflozin (JARDIANCE) 10 MG TABS tablet [id number] No Take 1 tablet (10 mg total) by mouth daily before breakfast.  Patient not taking: Reported on 02/17/2021   Crecencio Mc, MD Not Taking Active            Med Note De Hollingshead   Tue Feb 17, 2021  1:49 PM) Completing Invokamet supply  ezetimibe (ZETIA) 10 MG tablet [id number] Yes Take 1 tablet (10 mg total) by mouth daily. Minna Merritts, MD Taking Active   FREESTYLE LITE test strip [id number]  Yes CHECK BLOOD SUGAR DAILY AS DIRECTED Crecencio Mc, MD Taking Active   glucose monitoring kit (FREESTYLE) monitoring kit [id number] Yes 1 each by Does not apply route as needed for other. For use daily to monitor diabetes.  Please include lancets .  Test once daily   E11.9 Crecencio Mc, MD Taking Active Self  Lancets (FREESTYLE) lancets [id number] Yes Use as instructed Crecencio Mc, MD Taking Active Self  levothyroxine (SYNTHROID) 75 MCG tablet [id number] Yes TAKE 1 TABLET (75 MCG TOTAL) BY MOUTH DAILY. DOSE REDUCED AGAIN Crecencio Mc, MD Taking Active   metoprolol succinate (TOPROL-XL) 25 MG 24 hr tablet [id number] Yes Take 0.5 tablets (12.5 mg total) by mouth daily. Take with or immediately following a meal. Gollan, Kathlene November, MD Taking Active   tamsulosin (FLOMAX) 0.4 MG CAPS capsule [id number] Yes TAKE 1 CAPSULE (0.4 MG TOTAL) DAILY BY MOUTH. Crecencio Mc, MD Taking Active   telmisartan (MICARDIS) 20 MG tablet [id number] Yes Take 0.5 tablets (10 mg total) by mouth daily. Minna Merritts, MD Taking Active   Patient not taking:  Discontinued 02/17/21 1401 (Completed Course)            Med Note Darnelle Maffucci, [place] Feb 17, 2021  1:51 PM) Taking 1000 units daily            Pertinent Labs:   Lab Results  Component Value Date   HGBA1C 6.7 (H) 10/09/2020   Lab  Results  Component Value Date   CHOL 117 07/09/2020   HDL 50.80 07/09/2020   LDLCALC 45 07/09/2020   LDLDIRECT 193.0 05/18/2017   TRIG 105.0 07/09/2020   CHOLHDL 2 07/09/2020   Lab Results  Component Value Date   CREATININE 0.94 10/09/2020   BUN 21 10/09/2020   NA 138 10/09/2020   K 4.3 10/09/2020   CL 102 10/09/2020   CO2 27 10/09/2020    SDOH:  (Social Determinants of Health) assessments and interventions performed:  SDOH Interventions    Flowsheet Row Most Recent Value  SDOH Interventions   Financial Strain Interventions Intervention Not Indicated       CCM Care Plan  Review of patient  past medical history, allergies, medications, health status, including review of consultants reports, laboratory and other test data, was performed as part of comprehensive evaluation and provision of chronic care management services.   Care Plan : Medication Monitoring  Updates made by De Hollingshead, RPH-CPP since 05/19/2021 12:00 AM     Problem: Diabetes, CAD, HTN      Long-Range Goal: Medication Management   Start Date: 02/17/2021  Recent Progress: On track  Priority: High  Note:   Current Barriers:  Unable to independently afford treatment regimen  Pharmacist Clinical Goal(s):  Over the next 90 days, patient will verbalize ability to afford treatment regimen through collaboration with PharmD and provider.   Interventions: 1:1 collaboration with Crecencio Mc, MD regarding development and update of comprehensive plan of care as evidenced by provider attestation and co-signature Inter-disciplinary care team collaboration (see longitudinal plan of care) Comprehensive medication review performed; medication list updated in electronic medical record  Health Maintenance Yearly diabetic eye exam: up to date Yearly diabetic foot exam: due- encourage at next PCP visit Urine microalbumin: up to date Yearly influenza vaccination: due - recommend yearly vaccine Td/Tdap vaccination: due Pneumonia vaccination: up to date COVID vaccinations: due - recommend bivalent booster Shingrix vaccinations: up to date Colonoscopy: up to date Scheduled follow up with PCP in a few weeks, fasting labs before.   Diabetes: Controlled; current treatment: Invokamet XR 50/1000 mg 2 tablets daily- reports he stopped this medication due to "brain fog". Requests wanting to switch back to glipizide. Current glucose readings: fasting glucose: 80-110 after stopping Invokamet Reviewed risks of hypoglycemia, weight gain with glipizide. Reviewed CV benefits with SGLT2 inhibitors. Reviewed VA formulary.  Suggested we trial Jardiance 10 mg daily instead of glipizide. Will prepare sample. Advised patient to call the [facility] tomorrow to see if he has the [facility] plan that allows external providers to prescribe to the [facility], or if he needs to schedule follow up with his [facility] PCP to have the medication prescribed through them. He verbalized understanding  Hypertension in the setting of hx CAD: Controlled; current treatment: telmisartan 10 mg daily (splitting 20 mg tablet), metoprolol succinate 12.5 mg daily Current home readings: not checking at home due to control in office Previously recommended to continue current regimen at this time  Hyperlipidemia and secondary ASCVD prevention: Controlled; current treatment: atorvastatin 80 mg daily, ezetimibe 10 mg daily;  Previously recommended to continue current regimen at this time  Depression/Anxiety: Controlled per patient report; current treatment: duloxetine 30 mg daily, bupropion XL 150 mg daily Previously recommended to contact pharmacy for bupropion XL refill.   Hypothyroidism: Controlled per last lab; current treatment: levothyroxine 75 mcg daily Previously recommended to continue current regimen at this time  BPH: Controlled per patient report; current treatment:  tamsulosin 0.4 mg daily Previously recommended to continue current regimen at this time  Supplements: Reports he completed 3 months of weekly Vitamin D2 50,000 units and now is taking daily OTC Vitamin D3 1000 units. Continue current regimen at this time   Patient Goals/Self-Care Activities Over the next 90 days, patient will:  - take medications as prescribed check glucose periodically, document, and provide at future appointments collaborate with provider on medication access solutions      Plan: Telephone follow up appointment with care management team member scheduled for:  3 months (PCP in 6 weeks)  Catie Darnelle Maffucci, PharmD, [place], [place]  Pharmacist [employer] at [employer] [phone]

## 2021-05-19 NOTE — Telephone Encounter (Signed)
**Note Brett-Identified via Obfuscation** Patient overdue for PCP visit. Scheduled in January, scheduled fasting labs the week before. Ordered TSH, CMP, A1c, lipids. Routing to PCP for addition of any other pertinent labs.    Medication Samples have been labeled and logged for the patient.  Drug name: Jardiance       Strength: 10 mg        Qty: 4 boxes  LOT: [id number]  Exp.Date: 10/2022  Dosing instructions: Take 1 tablet by mouth daily  The patient has been instructed regarding the correct time, dose, and frequency of taking this medication, including desired effects and most common side effects.   Brett [name] 2:38 PM 05/19/2021

## 2021-05-25 ENCOUNTER — Ambulatory Visit: Payer: PPO

## 2021-05-26 ENCOUNTER — Ambulatory Visit (INDEPENDENT_AMBULATORY_CARE_PROVIDER_SITE_OTHER): Payer: PPO

## 2021-05-26 VITALS — Ht 71.0 in | Wt 172.0 lb

## 2021-05-26 DIAGNOSIS — Z0000 Encounter for general adult medical examination without abnormal findings: Secondary | ICD-10-CM

## 2021-05-26 NOTE — Patient Instructions (Addendum)
Brett [name] , Thank you for taking time to come for your Medicare Wellness Visit. I appreciate your ongoing commitment to your health goals. Please review the following plan we discussed and let me know if I can assist you in the future.   These are the goals we discussed:  Goals       Patient Stated     Maintain Healthy Lifestyle (pt-stated)      Stay active Healthy diet      Medication Monitoring (pt-stated)      Patient Goals/Self-Care Activities Over the next 90 days, patient will:  - take medications as prescribed check glucose periodically, document, and provide at future appointments collaborate with provider on medication access solutions        This is a list of the screening recommended for you and due dates:  Health Maintenance  Topic Date Due   Hemoglobin A1C  04/11/2021   COVID-19 Vaccine (5 - Booster for Pfizer series) 06/11/2021*   Complete foot exam   06/26/2021*   Flu Shot  09/04/2021*   Eye exam for diabetics  05/29/2021   Colon Cancer Screening  01/24/2022   Tetanus Vaccine  03/17/2023   Pneumonia Vaccine  Completed   Zoster (Shingles) Vaccine  Completed   HPV Vaccine  Aged Out   Hepatitis C Screening: USPSTF Recommendation to screen - Ages 37-79 yo.  Discontinued  *Topic was postponed. The date shown is not the original due date.    Advanced directives: End of life planning; Advance aging; Advanced directives discussed.  Copy of current HCPOA/Living Will requested.    Conditions/risks identified: none new  Follow up in one year for your annual wellness visit.   Preventive Care 29 Years and Older, Male Preventive care refers to lifestyle choices and visits with your health care provider that can promote health and wellness. What does preventive care include? A yearly physical exam. This is also called an annual well check. Dental exams once or twice a year. Routine eye exams. Ask your health care provider how often you should have your eyes  checked. Personal lifestyle choices, including: Daily care of your teeth and gums. Regular physical activity. Eating a healthy diet. Avoiding tobacco and drug use. Limiting alcohol use. Practicing safe sex. Taking low doses of aspirin every day. Taking vitamin and mineral supplements as recommended by your health care provider. What happens during an annual well check? The services and screenings done by your health care provider during your annual well check will depend on your age, overall health, lifestyle risk factors, and family history of disease. Counseling  Your health care provider may ask you questions about your: Alcohol use. Tobacco use. Drug use. Emotional well-being. Home and relationship well-being. Sexual activity. Eating habits. History of falls. Memory and ability to understand (cognition). Work and work Statistician. Screening  You may have the following tests or measurements: Height, weight, and BMI. Blood pressure. Lipid and cholesterol levels. These may be checked every 5 years, or more frequently if you are over 39 years old. Skin check. Lung cancer screening. You may have this screening every year starting at age 54 if you have a 30-pack-year history of smoking and currently smoke or have quit within the past 15 years. Fecal occult blood test (FOBT) of the stool. You may have this test every year starting at age 58. Flexible sigmoidoscopy or colonoscopy. You may have a sigmoidoscopy every 5 years or a colonoscopy every 10 years starting at age 70. Prostate cancer screening.  Recommendations will vary depending on your family history and other risks. Hepatitis C blood test. Hepatitis B blood test. Sexually transmitted disease (STD) testing. Diabetes screening. This is done by checking your blood sugar (glucose) after you have not eaten for a while (fasting). You may have this done every 1-3 years. Abdominal aortic aneurysm (AAA) screening. You may need this  if you are a current or former smoker. Osteoporosis. You may be screened starting at age 65 if you are at high risk. Talk with your health care provider about your test results, treatment options, and if necessary, the need for more tests. Vaccines  Your health care provider may recommend certain vaccines, such as: Influenza vaccine. This is recommended every year. Tetanus, diphtheria, and acellular pertussis (Tdap, Td) vaccine. You may need a Td booster every 10 years. Zoster vaccine. You may need this after age 89. Pneumococcal 13-valent conjugate (PCV13) vaccine. One dose is recommended after age 37. Pneumococcal polysaccharide (PPSV23) vaccine. One dose is recommended after age 5. Talk to your health care provider about which screenings and vaccines you need and how often you need them. This information is not intended to replace advice given to you by your health care provider. Make sure you discuss any questions you have with your health care provider. Document Released: 06/20/2015 Document Revised: 02/11/2016 Document Reviewed: 03/25/2015 Elsevier Interactive Patient Education  2017 [place] Prevention in the Home Falls can cause injuries. They can happen to people of all ages. There are many things you can do to make your home safe and to help prevent falls. What can I do on the outside of my home? Regularly fix the edges of walkways and driveways and fix any cracks. Remove anything that might make you trip as you walk through a door, such as a raised step or threshold. Trim any bushes or trees on the path to your home. Use bright outdoor lighting. Clear any walking paths of anything that might make someone trip, such as rocks or tools. Regularly check to see if handrails are loose or broken. Make sure that both sides of any steps have handrails. Any raised decks and porches should have guardrails on the edges. Have any leaves, snow, or ice cleared regularly. Use sand  or salt on walking paths during winter. Clean up any spills in your garage right away. This includes oil or grease spills. What can I do in the bathroom? Use night lights. Install grab bars by the toilet and in the tub and shower. Do not use towel bars as grab bars. Use non-skid mats or decals in the tub or shower. If you need to sit down in the shower, use a plastic, non-slip stool. Keep the floor dry. Clean up any water that spills on the floor as soon as it happens. Remove soap buildup in the tub or shower regularly. Attach bath mats securely with double-sided non-slip rug tape. Do not have throw rugs and other things on the floor that can make you trip. What can I do in the bedroom? Use night lights. Make sure that you have a light by your bed that is easy to reach. Do not use any sheets or blankets that are too big for your bed. They should not hang down onto the floor. Have a firm chair that has side arms. You can use this for support while you get dressed. Do not have throw rugs and other things on the floor that can make you trip. What can I  do in the kitchen? Clean up any spills right away. Avoid walking on wet floors. Keep items that you use a lot in easy-to-reach places. If you need to reach something above you, use a strong step stool that has a grab bar. Keep electrical cords out of the way. Do not use floor polish or wax that makes floors slippery. If you must use wax, use non-skid floor wax. Do not have throw rugs and other things on the floor that can make you trip. What can I do with my stairs? Do not leave any items on the stairs. Make sure that there are handrails on both sides of the stairs and use them. Fix handrails that are broken or loose. Make sure that handrails are as long as the stairways. Check any carpeting to make sure that it is firmly attached to the stairs. Fix any carpet that is loose or worn. Avoid having throw rugs at the top or bottom of the stairs.  If you do have throw rugs, attach them to the floor with carpet tape. Make sure that you have a light switch at the top of the stairs and the bottom of the stairs. If you do not have them, ask someone to add them for you. What else can I do to help prevent falls? Wear shoes that: Do not have high heels. Have rubber bottoms. Are comfortable and fit you well. Are closed at the toe. Do not wear sandals. If you use a stepladder: Make sure that it is fully opened. Do not climb a closed stepladder. Make sure that both sides of the stepladder are locked into place. Ask someone to hold it for you, if possible. Clearly mark and make sure that you can see: Any grab bars or handrails. First and last steps. Where the edge of each step is. Use tools that help you move around (mobility aids) if they are needed. These include: Canes. Walkers. Scooters. Crutches. Turn on the lights when you go into a dark area. Replace any light bulbs as soon as they burn out. Set up your furniture so you have a clear path. Avoid moving your furniture around. If any of your floors are uneven, fix them. If there are any pets around you, be aware of where they are. Review your medicines with your doctor. Some medicines can make you feel dizzy. This can increase your chance of falling. Ask your doctor what other things that you can do to help prevent falls. This information is not intended to replace advice given to you by your health care provider. Make sure you discuss any questions you have with your health care provider. Document Released: 03/20/2009 Document Revised: 10/30/2015 Document Reviewed: 06/28/2014 Elsevier Interactive Patient Education  2017 [employer].

## 2021-05-26 NOTE — Progress Notes (Addendum)
Subjective:   Brett [name] is a 80 y.o. male who presents for Medicare Annual/Subsequent preventive examination.  Review of Systems    No ROS.  Medicare Wellness Virtual Visit.  Visual/audio telehealth visit, UTA vital signs.   See social history for additional risk factors.   Cardiac Risk Factors include: advanced age (>28mn, >>41women);male gender;diabetes mellitus;hypertension     Objective:    Today's Vitals   05/26/21 1402  Weight: 172 lb (78 kg)  Height: 5' 11"  (1.803 m)   Body mass index is 23.99 kg/m.  Advanced Directives 05/26/2021 05/22/2020 01/18/2020 01/10/2020 05/22/2019 05/17/2018 05/11/2017  Does Patient Have a Medical Advance Directive? Yes Yes Yes Yes Yes Yes Yes  Type of AParamedicof AWhite SalmonLiving will - HLuis Llorens TorresLiving will HLubeckLiving will HCambriaLiving will HSulphurLiving will HCarlisleLiving will  Does patient want to make changes to medical advance directive? No - Patient declined No - Patient declined - - No - Patient declined No - Patient declined No - Patient declined  Copy of HKootenaiin Chart? No - copy requested - No - copy requested No - copy requested No - copy requested No - copy requested No - copy requested    Current Medications (verified) Outpatient Encounter Medications as of 05/26/2021  Medication Sig   atorvastatin (LIPITOR) 80 MG tablet Take 1 tablet (80 mg total) by mouth daily.   buPROPion (WELLBUTRIN XL) 150 MG 24 hr tablet TAKE 1 TABLET BY MOUTH EVERY DAY (Patient not taking: Reported on 02/17/2021)   DULoxetine (CYMBALTA) 30 MG capsule TAKE 1 CAPSULE BY MOUTH EVERY DAY   empagliflozin (JARDIANCE) 10 MG TABS tablet Take 1 tablet (10 mg total) by mouth daily before breakfast.   ezetimibe (ZETIA) 10 MG tablet Take 1 tablet (10 mg total) by mouth daily.   FREESTYLE LITE test strip CHECK  BLOOD SUGAR DAILY AS DIRECTED   glucose monitoring kit (FREESTYLE) monitoring kit 1 each by Does not apply route as needed for other. For use daily to monitor diabetes.  Please include lancets .  Test once daily   E11.9   Lancets (FREESTYLE) lancets Use as instructed   levothyroxine (SYNTHROID) 75 MCG tablet TAKE 1 TABLET BY MOUTH EVERY DAY   metoprolol succinate (TOPROL-XL) 25 MG 24 hr tablet Take 0.5 tablets (12.5 mg total) by mouth daily. Take with or immediately following a meal.   tamsulosin (FLOMAX) 0.4 MG CAPS capsule TAKE 1 CAPSULE (0.4 MG TOTAL) DAILY BY MOUTH.   telmisartan (MICARDIS) 20 MG tablet Take 0.5 tablets (10 mg total) by mouth daily.   No facility-administered encounter medications on file as of 05/26/2021.    Allergies (verified) Simvastatin and Eszopiclone   History: Past Medical History:  Diagnosis Date   Allergy    resolved after CABG   Calcific Achilles tendonitis    Calculus of ureter    Coronary artery disease    Diabetes mellitus    H/O renal calculi 2012   Hematuria, unspecified    Hyperlipidemia    Hypertension    Impotence of organic origin    Myocardial infarction ([facility]    Peyronie's disease    Pulmonary nodule seen on imaging study 2012   Sleep difficulties    Swelling, mass, or lump in chest    Unspecified hypothyroidism    Past Surgical History:  Procedure Laterality Date   CARDIAC CATHETERIZATION  2001   [facility] x3 stent   COLONOSCOPY     COLONOSCOPY WITH PROPOFOL N/A 01/24/2017   Procedure: COLONOSCOPY WITH PROPOFOL;  Surgeon: Manya Silvas, MD;  Location: [facility] END[facility]COPY;  Service: Endoscopy;  Laterality: N/A;   CORONARY ARTERY BYPASS GRAFT  2006   4 vessel, after 3 or 4 stents    CORONARY ARTERY BYPASS GRAFT     CYSTOSCOPY W/ RETROGRADES  01/18/2020   Procedure: CYSTOSCOPY WITH RETROGRADE PYELOGRAM;  Surgeon: Billey Co, MD;  Location: [facility] ORS;  Service: Urology;;   CYSTOSCOPY/URETEROSCOPY/HOLMIUM LASER/STENT  PLACEMENT Right 01/18/2020   Procedure: CYSTOSCOPY/URETEROSCOPY/HOLMIUM LASER/STENT PLACEMENT;  Surgeon: Billey Co, MD;  Location: [facility] ORS;  Service: Urology;  Laterality: Right;   OSTECTOMY CALCANEUS Left    PENILE PROSTHESIS IMPLANT     Family History  Problem Relation Age of Onset   Hyperlipidemia Mother    AAA (abdominal aortic aneurysm) Mother    Heart disease Father 64   Social History   Socioeconomic History   Marital status: Married    Spouse name: Audelia Acton now deceased   Number of children: Not on file   Years of education: Not on file   Highest education level: Not on file  Occupational History    Employer: retired  Tobacco Use   Smoking status: Former    Types: Cigars    Quit date: 10/04/1981    Years since quitting: 39.6   Smokeless tobacco: Never  Vaping Use   Vaping Use: Never used  Substance and Sexual Activity   Alcohol use: Yes    Alcohol/week: 1.0 - 2.0 standard drink    Types: 1 - 2 Cans of beer per week    Comment: everyday   Drug use: No   Sexual activity: Not Currently  Other Topics Concern   Not on file  Social History Narrative   Not on file   Social Determinants of Health   Financial Resource Strain: Low Risk    Difficulty of Paying Living Expenses: Not hard at all  Food Insecurity: No Food Insecurity   Worried About Charity fundraiser in the Last Year: Never true   [place] in the Last Year: Never true  Transportation Needs: No Transportation Needs   Lack of Transportation (Medical): No   Lack of Transportation (Non-Medical): No  Physical Activity: Sufficiently Active   Days of Exercise per Week: 3 days   Minutes of Exercise per Session: 50 min  Stress: No Stress Concern Present   Feeling of Stress : Not at all  Social Connections: Unknown   Frequency of Communication with Friends and Family: Not on file   Frequency of Social Gatherings with Friends and Family: Not on file   Attends Religious Services: Not on Advertising copywriter or Organizations: Not on file   Attends Archivist Meetings: Not on file   Marital Status: Married    Tobacco Counseling Counseling given: Not Answered   Clinical Intake:  Pre-visit preparation completed: Yes        Diabetes: Yes (Followed by pcp)  How often do you need to have someone help you when you read instructions, pamphlets, or other written materials from your doctor or pharmacy?: 1 - Never  Interpreter Needed?: No      Activities of Daily Living In your present state of health, do you have any difficulty performing the following activities: 05/26/2021  Hearing? N  Vision? N  Difficulty concentrating or making decisions? N  Walking or climbing stairs? N  Dressing or bathing? N  Doing errands, shopping? N  Preparing Food and eating ? N  Using the Toilet? N  In the past six months, have you accidently leaked urine? N  Do you have problems with loss of bowel control? N  Managing your Medications? N  Managing your Finances? N  Housekeeping or managing your Housekeeping? N  Some recent data might be hidden    Patient Care Team: Crecencio Mc, MD as PCP - General (Internal Medicine) Minna Merritts, MD as Consulting Physician (Cardiology) De Hollingshead, RPH-CPP (Pharmacist)  Indicate any recent Medical Services you may have received from other than [facility] providers in the past year (date may be approximate).     Assessment:   This is a routine wellness examination for Girard.  Virtual Visit via Telephone Note  I connected with  Keatin Warda on 05/26/21 at  2:00 PM EST by telephone and verified that I am speaking with the correct person using two identifiers.  Persons participating in the virtual visit: patient/Nurse Health Advisor   I discussed the limitations, risks, security and privacy concerns of performing an evaluation and management service by telephone and the availability of in person appointments. The  patient expressed understanding and agreed to proceed.  Interactive audio and video telecommunications were attempted between this nurse and patient, however failed, due to patient having technical difficulties OR patient did not have access to video capability.  We continued and completed visit with audio only.  Some vital signs may be absent or patient reported.   Hearing/Vision screen Hearing Screening - Comments:: Patient is able to hear conversational tones without difficulty.  No issues reported.  Vision Screening - Comments:: Followed by [facility]  Wears corrective lenses when reading  They have regular follow up with the ophthalmologist  Dietary issues and exercise activities discussed: Current Exercise Habits: Home exercise routine, Type of exercise: walking, Intensity: Mild Regular diet Good water intake   Goals Addressed               This Visit's Progress     Patient Stated     Maintain Healthy Lifestyle (pt-stated)        Stay active Healthy diet       Depression Screen PHQ 2/9 Scores 05/26/2021 10/09/2020 07/09/2020 05/22/2020 01/07/2020 05/23/2019 05/22/2019  PHQ - 2 Score 0 1 2 0 0 1 0  PHQ- 9 Score - 2 7 - 1 1 -    Fall Risk Fall Risk  05/26/2021 10/09/2020 07/09/2020 05/22/2020 01/07/2020  Falls in the past year? 0 0 0 0 0  Number falls in past yr: - - 0 0 -  Injury with Fall? - - 0 0 -  Follow up Falls evaluation completed - Falls evaluation completed Falls evaluation completed Falls evaluation completed   [place]: Home free of loose throw rugs in walkways, pet beds, electrical cords, etc? Yes  Adequate lighting in your home to reduce risk of falls? Yes   ASSISTIVE DEVICES UTILIZED TO PREVENT FALLS: Life alert? No  Use of a cane, walker or w/c? No   TIMED UP AND GO: Was the test performed? No .   Cognitive Function: Patient is alert and oriented x3.  MMSE - Mini Mental State Exam 05/11/2017 05/07/2015   Orientation to time 5 5  Orientation to Place 5 5  Registration 3 3  Attention/ Calculation 5 5  Recall 3 3  Language- name 2 objects 2 2  Language- repeat 1 1  Language- follow 3 step command 3 3  Language- read & follow direction 1 1  Write a sentence 1 1  Copy design 1 1  Total score 30 30     6CIT Screen 05/26/2021 05/22/2019 05/17/2018 05/11/2016  What Year? 0 points 0 points 0 points 0 points  What month? 0 points 0 points 0 points 0 points  What time? 0 points 0 points 0 points 0 points  Count back from 20 0 points 0 points 0 points 0 points  Months in reverse 0 points 0 points 0 points 0 points  Repeat phrase 0 points 0 points - 0 points  Total Score 0 0 - 0    Immunizations Immunization History  Administered Date(s) Administered   Fluad Quad(high Dose 65+) 03/21/2020   Influenza Split 03/16/2013, 02/11/2015   Influenza Whole 02/14/2012   Influenza-Unspecified 02/05/2013, 02/11/2015, 02/27/2016, 02/20/2018, 02/20/2019   PFIZER(Purple Top)SARS-COV-2 Vaccination 06/12/2019, 07/03/2019, 01/20/2020, 09/30/2020   Pneumococcal Conjugate-13 03/16/2013   Pneumococcal Polysaccharide-23 07/08/2009, 02/02/2013, 06/25/2015   Tdap 02/28/2012, 02/02/2013, 03/16/2013   Zoster Recombinat (Shingrix) 04/06/2018, 08/29/2018   Shingrix Completed?: No.    Education has been provided regarding the importance of this vaccine. Patient has been advised to call insurance company to determine out of pocket expense if they have not yet received this vaccine. Advised may also receive vaccine at local pharmacy or Health Dept. Verbalized acceptance and understanding.  Screening Tests Health Maintenance  Topic Date Due   HEMOGLOBIN A1C  04/11/2021   COVID-19 Vaccine (5 - Booster for Pfizer series) 06/11/2021 (Originally 11/25/2020)   FOOT EXAM  06/26/2021 (Originally 01/06/2021)   INFLUENZA VACCINE  09/04/2021 (Originally 01/05/2021)   OPHTHALMOLOGY EXAM  05/29/2021   COLONOSCOPY (Pts 45-38yr  Insurance coverage will need to be confirmed)  01/24/2022   TETANUS/TDAP  03/17/2023   Pneumonia Vaccine 80 Years old  Completed   Zoster Vaccines- Shingrix  Completed   HPV VACCINES  Aged Out   Hepatitis C Screening  Discontinued   Health Maintenance Health Maintenance Due  Topic Date Due   HEMOGLOBIN A1C  04/11/2021   Colorectal cancer screening: Type of screening: Colonoscopy. Completed 01/2017. Repeat every 5 years.   Lung Cancer Screening: (Low Dose CT Chest recommended if Age 78-80years, 30 pack-year currently smoking OR have quit w/in 15years.) does not qualify.   Hepatitis C Screening: does not qualify  Vision Screening: Recommended annual ophthalmology exams for early detection of glaucoma and other disorders of the eye.  Dental Screening: Recommended annual dental exams for proper oral hygiene  Community Resource Referral / Chronic Care Management: CRR required this visit?  No   CCM required this visit?  No      Plan:   Keep all routine maintenance appointments.   I have personally reviewed and noted the following in the patients chart:   Medical and social history Use of alcohol, tobacco or illicit drugs  Current medications and supplements including opioid prescriptions. Patient is not currently taking opioid prescriptions. Functional ability and status Nutritional status Physical activity Advanced directives List of other physicians Hospitalizations, surgeries, and ER visits in previous 12 months Vitals Screenings to include cognitive, depression, and falls Referrals and appointments  In addition, I have reviewed and discussed with patient certain preventive protocols, quality metrics, and best practice recommendations. A written personalized care plan for preventive services as well as general preventive health recommendations were provided to patient.  OBrien-Blaney, Leibish Mcgregor L, LPN   20/09/1591    I have reviewed the above information and agree  with above.   Deborra Medina, MD

## 2021-06-02 ENCOUNTER — Telehealth: Payer: Self-pay | Admitting: Internal Medicine

## 2021-06-02 DIAGNOSIS — E119 Type 2 diabetes mellitus without complications: Secondary | ICD-10-CM

## 2021-06-02 MED ORDER — EMPAGLIFLOZIN 10 MG PO TABS: 10.0000 mg | ORAL_TABLET | Freq: Every day | ORAL | 1 refills | Status: DC

## 2021-06-02 NOTE — Telephone Encounter (Signed)
Medication has been refilled.

## 2021-06-02 NOTE — Telephone Encounter (Signed)
Patient requesting a refill on his empagliflozin (JARDIANCE) 10 MG TABS tablet. Patient is out of medication. Pharmacy is [facility] [address] [place].

## 2021-06-02 NOTE — Addendum Note (Signed)
Addended by: Adair Laundry on: 06/02/2021 03:37 PM   Modules accepted: Orders

## 2021-06-03 NOTE — Telephone Encounter (Signed)
Patient called in stated that we need to [facility] the prescription  empagliflozin (JARDIANCE) 10 MG TABS The fax# [phone]. Attn Dr Jenetta Loges. Patient would like for Janett Billow to call after this is done @ [phone]

## 2021-06-03 NOTE — Telephone Encounter (Signed)
Called patient. Per our last visit, [facility] could [facility] Jardiance prescribed by Dr. Derrel Nip, as Vania Rea would be the most appropriate agent for him given history of ASCVD.   He notes that he has not heard anything back from the [place] regarding Jardiance. I encouraged him to follow up with the [facility] pharmacy this week. If he is unable to have medications prescribed to the [place] by a [place] provider and he chooses to not establish with a [place] provider, and cannot afford a brand copay (though copay should be ~$45 starting in January when the Coverage Gap resets), then I would recommend transition to metformin rather than glipizide, given risk of hypoglycemia.   Patient notes he will call me with an update once he has talked to the [place]

## 2021-06-03 NOTE — Telephone Encounter (Signed)
Pt called in requesting a medication change from (empagliflozin (JARDIANCE) 10 MG TABS tablet) to (metFORMIN (GLUMETZA) 1000 MG (MOD) 24 hr tablet). Pt stated that medication (empagliflozin (JARDIANCE) 10 MG TABS tablet) is $400 a month. Pt requesting for medication ( (metFORMIN (GLUMETZA) 1000 MG (MOD) 24 hr tablet) script to be sent to his pharmacy. Pt requesting callback with update.

## 2021-06-04 MED ORDER — EMPAGLIFLOZIN 10 MG PO TABS: 10.0000 mg | ORAL_TABLET | Freq: Every day | ORAL | 1 refills | Status: DC

## 2021-06-04 NOTE — Telephone Encounter (Signed)
Patient called in stated the fax # [phone] to [facility] for the medication prescription

## 2021-06-04 NOTE — Addendum Note (Signed)
Addended by: Adair Laundry on: 06/04/2021 12:23 PM   Modules accepted: Orders

## 2021-06-04 NOTE — Telephone Encounter (Signed)
LMTCB

## 2021-06-04 NOTE — Telephone Encounter (Signed)
Re faxed to number provided below.

## 2021-06-05 NOTE — Telephone Encounter (Signed)
LM to let pt know that the rx has been faxed to the [place].

## 2021-06-06 DIAGNOSIS — I152 Hypertension secondary to endocrine disorders: Secondary | ICD-10-CM

## 2021-06-06 DIAGNOSIS — E039 Hypothyroidism, unspecified: Secondary | ICD-10-CM

## 2021-06-06 DIAGNOSIS — E1159 Type 2 diabetes mellitus with other circulatory complications: Secondary | ICD-10-CM

## 2021-06-06 DIAGNOSIS — E119 Type 2 diabetes mellitus without complications: Secondary | ICD-10-CM

## 2021-06-06 DIAGNOSIS — I25708 Atherosclerosis of coronary artery bypass graft(s), unspecified, with other forms of angina pectoris: Secondary | ICD-10-CM

## 2021-06-12 ENCOUNTER — Other Ambulatory Visit: Payer: PPO

## 2021-06-16 ENCOUNTER — Ambulatory Visit: Payer: PPO | Admitting: Internal Medicine

## 2021-06-16 DIAGNOSIS — E119 Type 2 diabetes mellitus without complications: Secondary | ICD-10-CM | POA: Diagnosis not present

## 2021-06-16 DIAGNOSIS — H43813 Vitreous degeneration, bilateral: Secondary | ICD-10-CM | POA: Diagnosis not present

## 2021-06-16 LAB — HM DIABETES EYE EXAM

## 2021-06-17 ENCOUNTER — Ambulatory Visit: Payer: PPO | Admitting: Internal Medicine

## 2021-06-18 ENCOUNTER — Emergency Department: Payer: PPO

## 2021-06-18 ENCOUNTER — Other Ambulatory Visit: Payer: Self-pay

## 2021-06-18 ENCOUNTER — Observation Stay
Admission: EM | Admit: 2021-06-18 | Discharge: 2021-06-19 | Disposition: A | Discharge status: Home / Self Care | Payer: PPO | Attending: Family Medicine | Admitting: Family Medicine

## 2021-06-18 ENCOUNTER — Observation Stay: Payer: PPO

## 2021-06-18 ENCOUNTER — Encounter: Payer: Self-pay | Admitting: Internal Medicine

## 2021-06-18 DIAGNOSIS — E034 Atrophy of thyroid (acquired): Secondary | ICD-10-CM

## 2021-06-18 DIAGNOSIS — S199XXA Unspecified injury of neck, initial encounter: Secondary | ICD-10-CM | POA: Diagnosis not present

## 2021-06-18 DIAGNOSIS — E785 Hyperlipidemia, unspecified: Secondary | ICD-10-CM | POA: Diagnosis not present

## 2021-06-18 DIAGNOSIS — M47817 Spondylosis without myelopathy or radiculopathy, lumbosacral region: Secondary | ICD-10-CM | POA: Diagnosis not present

## 2021-06-18 DIAGNOSIS — R42 Dizziness and giddiness: Secondary | ICD-10-CM | POA: Insufficient documentation

## 2021-06-18 DIAGNOSIS — I25708 Atherosclerosis of coronary artery bypass graft(s), unspecified, with other forms of angina pectoris: Secondary | ICD-10-CM | POA: Diagnosis not present

## 2021-06-18 DIAGNOSIS — E11319 Type 2 diabetes mellitus with unspecified diabetic retinopathy without macular edema: Secondary | ICD-10-CM | POA: Diagnosis present

## 2021-06-18 DIAGNOSIS — M25552 Pain in left hip: Secondary | ICD-10-CM | POA: Diagnosis not present

## 2021-06-18 DIAGNOSIS — G8929 Other chronic pain: Secondary | ICD-10-CM

## 2021-06-18 DIAGNOSIS — R001 Bradycardia, unspecified: Secondary | ICD-10-CM | POA: Insufficient documentation

## 2021-06-18 DIAGNOSIS — J90 Pleural effusion, not elsewhere classified: Secondary | ICD-10-CM | POA: Diagnosis not present

## 2021-06-18 DIAGNOSIS — F5102 Adjustment insomnia: Secondary | ICD-10-CM | POA: Diagnosis not present

## 2021-06-18 DIAGNOSIS — Z20822 Contact with and (suspected) exposure to covid-19: Secondary | ICD-10-CM | POA: Insufficient documentation

## 2021-06-18 DIAGNOSIS — I959 Hypotension, unspecified: Secondary | ICD-10-CM | POA: Diagnosis not present

## 2021-06-18 DIAGNOSIS — F418 Other specified anxiety disorders: Secondary | ICD-10-CM | POA: Diagnosis present

## 2021-06-18 DIAGNOSIS — I6381 Other cerebral infarction due to occlusion or stenosis of small artery: Secondary | ICD-10-CM | POA: Diagnosis not present

## 2021-06-18 DIAGNOSIS — M4602 Spinal enthesopathy, cervical region: Secondary | ICD-10-CM | POA: Diagnosis not present

## 2021-06-18 DIAGNOSIS — I2510 Atherosclerotic heart disease of native coronary artery without angina pectoris: Secondary | ICD-10-CM | POA: Diagnosis present

## 2021-06-18 DIAGNOSIS — Z043 Encounter for examination and observation following other accident: Secondary | ICD-10-CM | POA: Diagnosis not present

## 2021-06-18 DIAGNOSIS — R911 Solitary pulmonary nodule: Secondary | ICD-10-CM | POA: Diagnosis not present

## 2021-06-18 DIAGNOSIS — I152 Hypertension secondary to endocrine disorders: Secondary | ICD-10-CM

## 2021-06-18 DIAGNOSIS — J9811 Atelectasis: Secondary | ICD-10-CM | POA: Diagnosis not present

## 2021-06-18 DIAGNOSIS — E1159 Type 2 diabetes mellitus with other circulatory complications: Secondary | ICD-10-CM | POA: Diagnosis not present

## 2021-06-18 DIAGNOSIS — I951 Orthostatic hypotension: Secondary | ICD-10-CM

## 2021-06-18 DIAGNOSIS — M50322 Other cervical disc degeneration at C5-C6 level: Secondary | ICD-10-CM | POA: Diagnosis not present

## 2021-06-18 DIAGNOSIS — E119 Type 2 diabetes mellitus without complications: Secondary | ICD-10-CM | POA: Insufficient documentation

## 2021-06-18 DIAGNOSIS — E039 Hypothyroidism, unspecified: Secondary | ICD-10-CM | POA: Diagnosis present

## 2021-06-18 DIAGNOSIS — R55 Syncope and collapse: Principal | ICD-10-CM | POA: Insufficient documentation

## 2021-06-18 DIAGNOSIS — M2578 Osteophyte, vertebrae: Secondary | ICD-10-CM | POA: Diagnosis not present

## 2021-06-18 DIAGNOSIS — G4700 Insomnia, unspecified: Secondary | ICD-10-CM | POA: Diagnosis present

## 2021-06-18 DIAGNOSIS — I10 Essential (primary) hypertension: Secondary | ICD-10-CM | POA: Diagnosis not present

## 2021-06-18 DIAGNOSIS — Z87898 Personal history of other specified conditions: Secondary | ICD-10-CM | POA: Diagnosis present

## 2021-06-18 LAB — COMPREHENSIVE METABOLIC PANEL
ALT: 25 U/L (ref 0–44)
AST: 28 U/L (ref 15–41)
Albumin: 4 g/dL (ref 3.5–5.0)
Alkaline Phosphatase: 43 U/L (ref 38–126)
Anion gap: 9 (ref 5–15)
BUN: 25 mg/dL — ABNORMAL HIGH (ref 8–23)
CO2: 26 mmol/L (ref 22–32)
Calcium: 9.3 mg/dL (ref 8.9–10.3)
Chloride: 103 mmol/L (ref 98–111)
Creatinine, Ser: 1.04 mg/dL (ref 0.61–1.24)
GFR, Estimated: >60 mL/min (ref >60)
Glucose, Bld: 153 mg/dL — ABNORMAL HIGH (ref 70–99)
Potassium: 4 mmol/L (ref 3.5–5.1)
Sodium: 138 mmol/L (ref 135–145)
Total Bilirubin: 1 mg/dL (ref 0.3–1.2)
Total Protein: 7.1 g/dL (ref 6.5–8.1)

## 2021-06-18 LAB — CBC
HCT: 40.9 % (ref 39.0–52.0)
Hemoglobin: 13.2 g/dL (ref 13.0–17.0)
MCH: 30.4 pg (ref 26.0–34.0)
MCHC: 32.3 g/dL (ref 30.0–36.0)
MCV: 94.2 fL (ref 80.0–100.0)
Platelets: 187 10*3/uL (ref 150–400)
RBC: 4.34 MIL/uL (ref 4.22–5.81)
RDW: 13.3 % (ref 11.5–15.5)
WBC: 7.2 10*3/uL (ref 4.0–10.5)
nRBC: 0 % (ref 0.0–0.2)

## 2021-06-18 LAB — RESP PANEL BY RT-PCR (FLU A&B, COVID) ARPGX2
Influenza A by PCR: NEGATIVE
Influenza B by PCR: NEGATIVE
SARS Coronavirus 2 by RT PCR: NEGATIVE

## 2021-06-18 LAB — T4, FREE: Free T4: 0.69 ng/dL (ref 0.61–1.12)

## 2021-06-18 LAB — TSH: TSH: 21.2 u[IU]/mL — ABNORMAL HIGH (ref 0.350–4.500)

## 2021-06-18 LAB — TROPONIN I (HIGH SENSITIVITY): Troponin I (High Sensitivity): 3 ng/L (ref <18)

## 2021-06-18 MED ORDER — TRAMADOL HCL 50 MG PO TABS
50.0000 mg | ORAL_TABLET | Freq: Once | ORAL | Status: AC
Start: 2021-06-18 — End: 2021-06-18
  Administered 2021-06-18: 50 mg via ORAL
  Filled 2021-06-18: qty 1

## 2021-06-18 MED ORDER — INSULIN ASPART 100 UNIT/ML IJ SOLN: 0.0000 [IU] | Freq: Every day | INTRAMUSCULAR | Status: DC

## 2021-06-18 MED ORDER — TAMSULOSIN HCL 0.4 MG PO CAPS: 0.4000 mg | ORAL_CAPSULE | Freq: Every day | ORAL | Status: DC

## 2021-06-18 MED ORDER — ATORVASTATIN CALCIUM 20 MG PO TABS: 80.0000 mg | ORAL_TABLET | Freq: Every day | ORAL | Status: DC

## 2021-06-18 MED ORDER — ACETAMINOPHEN 325 MG RE SUPP: 650.0000 mg | Freq: Four times a day (QID) | RECTAL | Status: DC | PRN

## 2021-06-18 MED ORDER — IRBESARTAN 75 MG PO TABS: 34.5000 mg | ORAL_TABLET | Freq: Every day | ORAL | Status: DC

## 2021-06-18 MED ORDER — SODIUM CHLORIDE 0.9% FLUSH
3.0000 mL | Freq: Two times a day (BID) | INTRAVENOUS | Status: DC
  Administered 2021-06-19 (×2): 3 mL via INTRAVENOUS

## 2021-06-18 MED ORDER — LEVOTHYROXINE SODIUM 50 MCG PO TABS
75.0000 ug | ORAL_TABLET | Freq: Every day | ORAL | Status: DC
  Administered 2021-06-19: 75 ug via ORAL
  Filled 2021-06-18: qty 2

## 2021-06-18 MED ORDER — INSULIN ASPART 100 UNIT/ML IJ SOLN
0.0000 [IU] | Freq: Three times a day (TID) | INTRAMUSCULAR | Status: DC
  Filled 2021-06-18: qty 1

## 2021-06-18 MED ORDER — TRAMADOL HCL 50 MG PO TABS: 50.0000 mg | ORAL_TABLET | Freq: Four times a day (QID) | ORAL | Status: DC | PRN

## 2021-06-18 MED ORDER — DULOXETINE HCL 30 MG PO CPEP
30.0000 mg | ORAL_CAPSULE | Freq: Every day | ORAL | Status: DC
  Administered 2021-06-19: 30 mg via ORAL
  Filled 2021-06-18: qty 1

## 2021-06-18 MED ORDER — SODIUM CHLORIDE 0.9 % IV BOLUS
1000.0000 mL | Freq: Once | INTRAVENOUS | Status: AC
  Administered 2021-06-18: 1000 mL via INTRAVENOUS

## 2021-06-18 MED ORDER — ENOXAPARIN SODIUM 40 MG/0.4ML IJ SOSY
40.0000 mg | PREFILLED_SYRINGE | INTRAMUSCULAR | Status: DC
  Administered 2021-06-19: 40 mg via SUBCUTANEOUS
  Filled 2021-06-18: qty 0.4

## 2021-06-18 MED ORDER — EZETIMIBE 10 MG PO TABS
10.0000 mg | ORAL_TABLET | Freq: Every day | ORAL | Status: DC
  Filled 2021-06-18: qty 1

## 2021-06-18 MED ORDER — ACETAMINOPHEN 325 MG PO TABS: 650.0000 mg | ORAL_TABLET | Freq: Four times a day (QID) | ORAL | Status: DC | PRN

## 2021-06-18 MED ORDER — MORPHINE SULFATE (PF) 2 MG/ML IV SOLN: 0.5000 mg | INTRAVENOUS | Status: DC | PRN

## 2021-06-18 NOTE — ED Triage Notes (Signed)
EMS called for near syncopal episode. Patient with witnessed syncopal episode while standing, patient lowered to floor and regained consciousness. 18G placed to L AC. AOX4 on arrival, denies pain. Resp even, unlabored on RA. SB on monitor at 40's. CBG 215.

## 2021-06-18 NOTE — H&P (Signed)
History and Physical   Brett [name] KZL:[id number] DOB: [birth year] DOA: 06/18/2021  PCP: Brett Mc, MD  Outpatient Specialists: Dr. Heide [name], [facility] ophthalmologist Patient coming from: Home via EMS  I have personally briefly reviewed patient's old medical records in [place].  Chief Concern: Syncope  HPI: Brett [name] is a 81 y.o. male with medical history significant for hypertension, BPH, hyperlipidemia, hypothyroid, non-insulin-dependent diabetes mellitus, who presents to the emergency department for chief concerns of syncopal event.  At bedside he is able to tell me his age, name, location (hospital) and families in the room. He reports that he must of passed out while he walking from one room to the next. He came to and would pass out again when EMS arrived. Patient and family did not know how long he was out the first time and the second time he did it with EMS, it was approximately 15 seconds.   He denies this happening before. This happened at approximately 6:30 to 6:45 PM on day of admission.  Patient did not recall any chest pain, shortness, abdominal pain, dysuria, diarrhea. He denies trauma to his person.   Patient and spouse states that he takes his metoprolol in the morning.  He does take Flomax and telmisartan 10 mg at the same time daily at approximately 530-545pm.  He had 15-20 unintentional weight loss in with the new, invokamet for about 6 months.  He is no longer on Invokamet.  He is currently on jardiance 10 mg daily.  Patient is not prescribed insulin for diabetes.  He denies nausea, vomitting, blood in in stool, hematuria.   Social history: He lives with his wife, Brett [name]. He denies tobacco use ever. He drinks 1 glass or 1 shot of burbon and ginger ale every day at dinner. HE denies recreational drug use.   Vaccination history: He is vaccinated for covid 19, 5 doses total.   ROS: Constitutional: + weight change, no fever ENT/Mouth: no sore throat, no  rhinorrhea Eyes: no eye pain, no vision changes Cardiovascular: no chest pain, no dyspnea,  no edema, no palpitations Respiratory: no cough, no sputum, no wheezing Gastrointestinal: no nausea, no vomiting, no diarrhea, no constipation Genitourinary: no urinary incontinence, no dysuria, no hematuria Musculoskeletal: no arthralgias, no myalgias Skin: no skin lesions, no pruritus, Neuro: + weakness, + loss of consciousness, + syncope Psych: no anxiety, no depression, + decrease appetite Heme/Lymph: no bruising, no bleeding  ED Course: Discussed with emergency medicine provider, patient requiring hospitalization for chief concerns of syncope.    Vitals in the emergency department showed temperature of 98.3, respiration rate of 16, heart rate of 48, blood pressure 164/71, SPO2 of 96% on room air.  Serum sodium 138, potassium 4.0, chloride of 103, bicarb 26, nonfasting blood glucose 153, BUN of 25, serum creatinine of 1.04, GFR greater than 60, high sensitive troponin is 3.  WBC 7.2, hemoglobin 13.2, platelets of 187.  COVID/influenza A/influenza B PCR were negative. TSH elevated at 21.2.  In the emergency department patient was given tramadol 50 mg p.o., sodium chloride 1 L bolus.  Assessment/Plan  Principal Problem:   Syncope Active Problems:   Hypertension associated with type 2 diabetes mellitus (HCC)   Hypothyroidism   Hyperlipidemia LDL goal <70   Pulmonary nodule seen on imaging study   Depression with anxiety   CAD (coronary artery disease)   Insomnia   Chronic hip pain, left   # Syncope-etiology work-up in progress at this time - Orthostatic vital signs  on admission Ordered and was found to be positive - Supine blood pressure 126/68, heart rate 54; sitting 109/71, heart rate of 63; standing 91/58, heart rate of 64 per nursing staff - Complete echo ordered, chest x-ray PA and lateral views ordered - Follow-up on high sensitivity troponin - Check free T4, procalcitonin -  Given patient's risk factors for surgery for CVA including diabetes and hypertension and atherosclerosis and history of CAD with CABG, MRI of the brain without contrast has been ordered - Admit to telemetry cardiac, observation  # Hypothyroid-resumed levothyroxine 75 mcg - TSH markedly elevated - Check free T4  # BPH-tamsulosin 0.4 mg daily has not been resumed due to orthostatic hypotension # Depression/anxiety-resume duloxetine 30 mg daily # History of hypertension-irbesartan/telmisartan has not been resumed due to positive orthostatic hypotension - Resumed metoprolol succinate 12.5 mg daily in the morning # Hyperlipidemia-atorvastatin 80 mg nightly, ezetimibe 10 mg daily # Noninsulin-dependent diabetes mellitus-insulin SSI with agents coverage ordered - Goal inpatient blood glucose is 140-150  # History of CAD status post CABG-resumed atorvastatin 80 mg daily  A.m. team to complete med reconciliation  Chart reviewed.   DVT prophylaxis: Enoxaparin 40 mg subcutaneous Code Status: Full code Diet: Heart healthy/carb modified Family Communication: Updated spouse Brett [name] at bedside and daughter who is a Marine scientist at bedside Disposition Plan: Pending imaging ordered Consults called: None at this time Admission status: Telemetry cardiac, observation  Past Medical History:  Diagnosis Date   Allergy    resolved after CABG   Calcific Achilles tendonitis    Calculus of ureter    Coronary artery disease    Diabetes mellitus    H/O renal calculi 2012   Hematuria, unspecified    Hyperlipidemia    Hypertension    Impotence of organic origin    Myocardial infarction [name])    Peyronie's disease    Pulmonary nodule seen on imaging study 2012   Sleep difficulties    Swelling, mass, or lump in chest    Unspecified hypothyroidism    Past Surgical History:  Procedure Laterality Date   CARDIAC CATHETERIZATION  2001   [facility] x3 stent   COLONOSCOPY     COLONOSCOPY WITH  PROPOFOL N/A 01/24/2017   Procedure: COLONOSCOPY WITH PROPOFOL;  Surgeon: Manya Silvas, MD;  Location: [facility] ENDOSCOPY;  Service: Endoscopy;  Laterality: N/A;   CORONARY ARTERY BYPASS GRAFT  2006   4 vessel, after 3 or 4 stents    CORONARY ARTERY BYPASS GRAFT     CYSTOSCOPY W/ RETROGRADES  01/18/2020   Procedure: CYSTOSCOPY WITH RETROGRADE PYELOGRAM;  Surgeon: Billey Co, MD;  Location: [facility] ORS;  Service: Urology;;   CYSTOSCOPY/URETEROSCOPY/HOLMIUM LASER/STENT PLACEMENT Right 01/18/2020   Procedure: CYSTOSCOPY/URETEROSCOPY/HOLMIUM LASER/STENT PLACEMENT;  Surgeon: Billey Co, MD;  Location: [facility] ORS;  Service: Urology;  Laterality: Right;   OSTECTOMY CALCANEUS Left    PENILE PROSTHESIS IMPLANT     Social History:  reports that he quit smoking about 39 years ago. His smoking use included cigars. He has never used smokeless tobacco. He reports current alcohol use of about 1.0 - 2.0 standard drink per week. He reports that he does not use drugs.  Allergies  Allergen Reactions   Simvastatin Other (See Comments)   Eszopiclone Rash   Family History  Problem Relation Age of Onset   Hyperlipidemia Mother    AAA (abdominal aortic aneurysm) Mother    Heart disease Father 41   Family history: Family history reviewed and not pertinent.  Prior to Admission medications   Medication Sig Start Date End Date Taking? Authorizing Provider  atorvastatin (LIPITOR) 80 MG tablet Take 1 tablet (80 mg total) by mouth daily. 09/10/20   Minna Merritts, MD  buPROPion (WELLBUTRIN XL) 150 MG 24 hr tablet TAKE 1 TABLET BY MOUTH EVERY DAY Patient not taking: Reported on 02/17/2021 11/05/20   Brett Mc, MD  DULoxetine (CYMBALTA) 30 MG capsule TAKE 1 CAPSULE BY MOUTH EVERY DAY 02/12/21   Brett Mc, MD  empagliflozin (JARDIANCE) 10 MG TABS tablet Take 1 tablet (10 mg total) by mouth daily before breakfast. 06/04/21   Brett Mc, MD  ezetimibe (ZETIA) 10 MG tablet Take 1 tablet (10 mg total)  by mouth daily. 09/10/20   Minna Merritts, MD  FREESTYLE LITE test strip CHECK BLOOD SUGAR DAILY AS DIRECTED 07/28/20   Brett Mc, MD  glucose monitoring kit (FREESTYLE) monitoring kit 1 each by Does not apply route as needed for other. For use daily to monitor diabetes.  Please include lancets .  Test once daily   E11.9 07/18/15   Brett Mc, MD  Lancets (FREESTYLE) lancets Use as instructed 08/04/15   Brett Mc, MD  levothyroxine (SYNTHROID) 75 MCG tablet TAKE 1 TABLET BY MOUTH EVERY DAY 05/06/21   Brett Mc, MD  metoprolol succinate (TOPROL-XL) 25 MG 24 hr tablet Take 0.5 tablets (12.5 mg total) by mouth daily. Take with or immediately following a meal. 09/10/20   Gollan, Kathlene November, MD  tamsulosin (FLOMAX) 0.4 MG CAPS capsule TAKE 1 CAPSULE (0.4 MG TOTAL) DAILY BY MOUTH. 04/22/21   Brett Mc, MD  telmisartan (MICARDIS) 20 MG tablet Take 0.5 tablets (10 mg total) by mouth daily. 09/10/20   Minna Merritts, MD   Physical Exam: Vitals:   06/18/21 2300 06/19/21 0050 06/19/21 0051 06/19/21 0052  BP: (!) 146/71 126/69 109/71 (!) 91/58  Pulse: (!) 50 (!) 54 63 64  Resp: 20     Temp:      TempSrc:      SpO2: 97%     Weight:      Height:       Constitutional: appears age-appropriate, NAD, calm, comfortable Eyes: PERRL, lids and conjunctivae normal ENMT: Mucous membranes are moist. Posterior pharynx clear of any exudate or lesions. Age-appropriate dentition. Hearing appropriate Neck: normal, supple, no masses, no thyromegaly Respiratory: clear to auscultation bilaterally, no wheezing, no crackles. Normal respiratory effort. No accessory muscle use.  Cardiovascular: Regular rate and rhythm, no murmurs / rubs / gallops. No extremity edema. 2+ pedal pulses. No carotid bruits.  Abdomen: no tenderness, no masses palpated, no hepatosplenomegaly. Bowel sounds positive.  Musculoskeletal: no clubbing / cyanosis. No joint deformity upper and lower extremities. Good ROM, no  contractures, no atrophy. Normal muscle tone.  Skin: no rashes, lesions, ulcers. No induration Neurologic: Sensation intact. Strength 5/5 in all 4.  Psychiatric: Normal judgment and insight. Alert and oriented x 3. Normal mood.   EKG: independently reviewed, showing sinus bradycardia with rate of 48, QTc 462  Chest x-ray on Admission: I personally reviewed and I agree with radiologist reading as below.  X-ray chest PA and lateral  Result Date: 06/18/2021 CLINICAL DATA:  Near syncopal episode. EXAM: CHEST - 2 VIEW COMPARISON:  Chest CT 11/22/2016, lung base images of CT abdomen and pelvis 12/31/2019. FINDINGS: The cardiac size is normal with old CABG changes. No vascular congestion is seen. Small chronic left pleural effusion is again noted  with associated coarse pleuroparenchymal stranding opacities chronically noted in the left base, as well as a pleural-based 1.8 cm rounded atelectasis chronically seen in the lateral left lower lung field. The lungs are clear of acute infiltrates and otherwise clear. No right pleural collection is seen. There is mild osteopenia with degenerative changes of the spine. IMPRESSION: No active cardiopulmonary disease. 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 Electronically Signed   By: Telford Nab M.D.   On: 06/18/2021 23:55   CT HEAD WO CONTRAST (5MM)  Result Date: 06/18/2021 CLINICAL DATA:  Syncope.  Head trauma, minor (Age >= 65y) EXAM: CT HEAD WITHOUT CONTRAST TECHNIQUE: Contiguous axial images were obtained from the base of the skull through the vertex without intravenous contrast. RADIATION DOSE REDUCTION: This exam was performed according to the departmental dose-optimization program which includes automated exposure control, adjustment of the mA and/or kV according to patient size and/or use of iterative reconstruction technique. COMPARISON:  None. 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 No acute intracranial abnormality. Specifically, no hemorrhage, hydrocephalus, mass lesion, acute infarction, or significant intracranial injury. Vascular: No hyperdense vessel or unexpected calcification. Skull: No acute calvarial abnormality. Sinuses/Orbits: No acute findings Other: None IMPRESSION: No acute intracranial abnormality. Electronically Signed   By: Rolm Baptise M.D.   On: 06/18/2021 22:16   CT Cervical Spine Wo Contrast  Result Date: 06/18/2021 CLINICAL DATA:  Neck trauma (Age >= 65y).  Syncope EXAM: CT CERVICAL SPINE WITHOUT CONTRAST TECHNIQUE: Multidetector CT imaging of the cervical spine was performed without intravenous contrast. Multiplanar CT image reconstructions were also generated. RADIATION DOSE REDUCTION: This exam was performed according to the departmental dose-optimization program which includes automated exposure control, adjustment of the mA and/or kV according to patient size and/or use of iterative reconstruction technique. COMPARISON:  None FINDINGS: Alignment: Normal. Skull base and vertebrae: No acute fracture. No primary bone lesion or focal pathologic process. Soft tissues and spinal canal: No prevertebral fluid or swelling. No visible canal hematoma. Disc levels: Diffuse degenerative disc disease with disc space narrowing and spurring most pronounced at C5-6 and C6-7. Moderate bilateral degenerative facet disease diffusely. Upper chest: No acute findings Other: None IMPRESSION: Degenerative disc and facet disease. No acute bony abnormality. Electronically Signed   By: Rolm Baptise M.D.   On: 06/18/2021 22:17   CT Thoracic Spine Wo Contrast  Result Date: 06/18/2021 CLINICAL DATA:  Trauma syncope EXAM: CT THORACIC SPINE WITHOUT CONTRAST TECHNIQUE: Multidetector CT images of the thoracic were obtained using the standard protocol without intravenous contrast. RADIATION DOSE REDUCTION: This exam was performed according to the departmental dose-optimization program  which includes automated exposure control, adjustment of the mA and/or kV according to patient size and/or use of iterative reconstruction technique. COMPARISON:  CT 12/31/2019 FINDINGS: Alignment: Normal. Vertebrae: No acute fracture or focal pathologic process. Paraspinal and other soft tissues: Partially visualized left pleural thickening and presumed round atelectasis at the left base. No paravertebral or paraspinal soft tissue abnormality. Disc levels: Multilevel degenerative osteophytes. No abnormal disc space narrowing or widening IMPRESSION: No acute osseous abnormality Electronically Signed   By: Donavan Foil M.D.   On: 06/18/2021 22:38   CT Lumbar Spine Wo Contrast  Result Date: 06/18/2021 CLINICAL DATA:  Fall EXAM: CT LUMBAR SPINE WITHOUT CONTRAST TECHNIQUE: Multidetector CT imaging of the lumbar spine was performed without intravenous contrast administration. Multiplanar CT image reconstructions were also generated. RADIATION DOSE REDUCTION: This exam was performed according to  the departmental dose-optimization program which includes automated exposure control, adjustment of the mA and/or kV according to patient size and/or use of iterative reconstruction technique. COMPARISON:  CT 12/31/2019 FINDINGS: Segmentation: 5 lumbar type vertebrae. Alignment: Normal. Vertebrae: No acute fracture or focal pathologic process. Paraspinal and other soft tissues: Aortic atherosclerosis. No paravertebral or paraspinal soft tissue abnormality. Disc levels: At L1-L2, no canal stenosis. Facet degenerative changes. The foramen are patent bilaterally. At L1-L2, maintained disc space. Mild diffuse disc bulge. No canal stenosis. Facet degenerative changes. The foramen are patent. At L3-L4, mild diffuse disc bulge. No canal stenosis. Moderate facet degenerative changes. The foramen are patent bilaterally. At L4-L5, mild diffuse disc bulge. Moderate facet degenerative changes. No canal stenosis. There is mild bilateral  foraminal narrowing. At L5-S1, marked disc space narrowing. No canal stenosis. Advanced degenerative changes of the bilateral facets. There is moderate bilateral foraminal narrowing. IMPRESSION: 1. No acute osseous abnormality. 2. Multilevel degenerative changes, worst at L5-S1 Electronically Signed   By: Donavan Foil M.D.   On: 06/18/2021 22:44    Labs on Admission: I have personally reviewed following labs  CBC: Recent Labs  Lab 06/18/21 2044  WBC 7.2  HGB 13.2  HCT 40.9  MCV 94.2  PLT 151   Basic Metabolic Panel: Recent Labs  Lab 06/18/21 2044  NA 138  K 4.0  CL 103  CO2 26  GLUCOSE 153*  BUN 25*  CREATININE 1.04  CALCIUM 9.3   GFR: Estimated Creatinine Clearance: 62.3 mL/min (by C-G formula based on SCr of 1.04 mg/dL).  Liver Function Tests: Recent Labs  Lab 06/18/21 2044  AST 28  ALT 25  ALKPHOS 43  BILITOT 1.0  PROT 7.1  ALBUMIN 4.0   Thyroid Function Tests: Recent Labs    06/18/21 2044  TSH 21.200*  FREET4 0.69   Urine analysis:    Component Value Date/Time   COLORURINE YELLOW 12/19/2014 1448   APPEARANCEUR Cloudy (A) 01/30/2020 0928   LABSPEC 1.020 12/19/2014 1448   PHURINE 6.0 12/19/2014 1448   GLUCOSEU 1+ (A) 01/30/2020 0928   GLUCOSEU NEGATIVE 12/19/2014 1448   HGBUR NEGATIVE 12/19/2014 1448   BILIRUBINUR Negative 01/30/2020 0928   KETONESUR NEGATIVE 12/19/2014 1448   PROTEINUR 3+ (A) 01/30/2020 0928   UROBILINOGEN 1.0 12/19/2019 0954   UROBILINOGEN 1.0 12/19/2014 1448   NITRITE Negative 01/30/2020 0928   NITRITE NEGATIVE 12/19/2014 1448   LEUKOCYTESUR Trace (A) 01/30/2020 0928   Dr. Tobie Poet [facility]  If 7PM-7AM, please contact overnight-coverage provider If 7AM-7PM, please contact day coverage provider [email or web address]  06/19/2021, 1:14 AM

## 2021-06-18 NOTE — ED Provider Notes (Signed)
[facility] Provider Note    Event Date/Time   First MD Initiated Contact with Patient 06/18/21 2059     (approximate)  History   Chief Complaint: Loss of Consciousness  HPI  Brett [name] is a 81 y.o. male with a past medical history of diabetes, hypertension, hyperlipidemia, presents to the emergency department after syncopal episode.  According to the patient and family they state this evening after eating dinner patient stood up from the recliner became very lightheaded and fell backwards passing out.  Patient regained consciousness within seconds while on the ground.  They attempted to stand the patient up and he once again became very lightheaded so they helped him back to the ground.  They called EMS but ultimately decided against coming to the emergency department however later this evening symptoms recurred when the patient stood became very lightheaded and nearly passed out so he came to the emergency department for evaluation.  Patient denies any chest pain at any point.  Denies any shortness of breath.  Denies any recent vomiting or diarrhea or fever.  Physical Exam   Triage Vital Signs: ED Triage Vitals  Enc Vitals Group     BP 06/18/21 2052 (!) 164/71     Pulse Rate 06/18/21 2052 (!) 48     Resp 06/18/21 2052 16     Temp --      Temp src --      SpO2 06/18/21 2052 96 %     Weight 06/18/21 2053 180 lb (81.6 kg)     Height 06/18/21 2053 5\' 11"  (1.803 m)     Head Circumference --      Peak Flow --      Pain Score 06/18/21 2053 0     Pain Loc --      Pain Edu? --      Excl. in [place]? --     Most recent vital signs: Vitals:   06/18/21 2052  BP: (!) 164/71  Pulse: (!) 48  Resp: 16  SpO2: 96%    General: Awake, no distress.  CV:  Good peripheral perfusion.  Regular rhythm however rate around 45 bpm. Resp:  Normal effort.  Equal breath sounds bilaterally.  Abd:  No distention.  Soft, nontender.  No rebound or guarding. Other:  Head appears  atraumatic.  Moves all extremities well.  Patient does have tenderness along the mid thoracic spine as well as upper lumbar spine.   ED Results / Procedures / Treatments   EKG  EKG viewed and interpreted by myself shows sinus bradycardia at 48 bpm with a narrow QRS, normal axis, borderline PR prolongation otherwise normal intervals with no concerning ST changes.  RADIOLOGY  I reviewed the CT images of the head, no acute abnormality on my evaluation. CT scan of the head read as negative by radiology. CT scan of the C-spine negative. CT scan of the T-spine negative. CT scan of the L-spine negative.   MEDICATIONS ORDERED IN ED: Medications  sodium chloride 0.9 % bolus 1,000 mL (has no administration in time range)     IMPRESSION / MDM / ASSESSMENT AND PLAN / ED COURSE  I reviewed the triage vital signs and the nursing notes.  Patient presents emergency department for syncopal as well as several near syncopal episodes.  Currently the patient appears well denies any symptoms while lying in bed.  States he feels normal.  Patient's heart rate however is maintaining around 45 bpm.  Appears to be a  sinus bradycardia however possibly buried P waves in the T wave we will obtain a rhythm strip to evaluate further.  We will check labs including cardiac enzymes, COVID we will IV hydrate and continue to closely monitor.  Differential is quite broad would include dehydration/orthostatic hypotension, bradycardia, ACS, COVID.  Given the patient's tenderness along the thoracic and upper lumbar spine we will obtain CT images of the back.  Given his fall he believes he hit the back of his head although no significant traumatic findings on exam we will obtain CT scan of the head and C-spine as precaution.  Patient agreeable to plan.  Patient's work-up is overall reassuring.  COVID and flu test are negative.  CBC is normal.  Chemistry is normal.  Troponin negative.  CT scans appear to be normal as well.   However given the patient's bradycardia between 45 and 50 bpm along with multiple syncopal episode/near syncopal episodes tonight with no history of syncopal episodes previously I do believe the patient warrants admission to the hospital for further evaluation by cardiology.  Patient appears to take 12.5 mg of extended release metoprolol daily, we will hold the metoprolol for now and continue to closely monitor on telemetry.  Patient agreeable to plan of care.    FINAL CLINICAL IMPRESSION(S) / ED DIAGNOSES   Syncope Bradycardia   Note:  This document was prepared using Dragon voice recognition software and may include unintentional dictation errors.   Harvest Dark, MD 06/19/21 2320

## 2021-06-19 ENCOUNTER — Other Ambulatory Visit: Payer: Self-pay | Admitting: Nurse Practitioner

## 2021-06-19 ENCOUNTER — Telehealth: Payer: Self-pay | Admitting: Cardiovascular Disease

## 2021-06-19 ENCOUNTER — Observation Stay (INDEPENDENT_AMBULATORY_CARE_PROVIDER_SITE_OTHER): Payer: PPO

## 2021-06-19 ENCOUNTER — Observation Stay (HOSPITAL_BASED_OUTPATIENT_CLINIC_OR_DEPARTMENT_OTHER)
Admit: 2021-06-19 | Discharge: 2021-06-19 | Disposition: A | Discharge status: Home / Self Care | Payer: PPO | Attending: Internal Medicine | Admitting: Internal Medicine

## 2021-06-19 DIAGNOSIS — R55 Syncope and collapse: Secondary | ICD-10-CM

## 2021-06-19 DIAGNOSIS — E1159 Type 2 diabetes mellitus with other circulatory complications: Secondary | ICD-10-CM | POA: Diagnosis not present

## 2021-06-19 DIAGNOSIS — E034 Atrophy of thyroid (acquired): Secondary | ICD-10-CM | POA: Diagnosis not present

## 2021-06-19 DIAGNOSIS — I951 Orthostatic hypotension: Secondary | ICD-10-CM

## 2021-06-19 DIAGNOSIS — I152 Hypertension secondary to endocrine disorders: Secondary | ICD-10-CM | POA: Diagnosis not present

## 2021-06-19 DIAGNOSIS — I25708 Atherosclerosis of coronary artery bypass graft(s), unspecified, with other forms of angina pectoris: Secondary | ICD-10-CM | POA: Diagnosis not present

## 2021-06-19 DIAGNOSIS — E785 Hyperlipidemia, unspecified: Secondary | ICD-10-CM | POA: Diagnosis not present

## 2021-06-19 DIAGNOSIS — R001 Bradycardia, unspecified: Secondary | ICD-10-CM | POA: Diagnosis not present

## 2021-06-19 DIAGNOSIS — E118 Type 2 diabetes mellitus with unspecified complications: Secondary | ICD-10-CM

## 2021-06-19 LAB — COMPREHENSIVE METABOLIC PANEL
ALT: 19 U/L (ref 0–44)
AST: 21 U/L (ref 15–41)
Albumin: 3.1 g/dL — ABNORMAL LOW (ref 3.5–5.0)
Alkaline Phosphatase: 39 U/L (ref 38–126)
Anion gap: 6 (ref 5–15)
BUN: 21 mg/dL (ref 8–23)
CO2: 22 mmol/L (ref 22–32)
Calcium: 7.3 mg/dL — ABNORMAL LOW (ref 8.9–10.3)
Chloride: 110 mmol/L (ref 98–111)
Creatinine, Ser: 0.63 mg/dL (ref 0.61–1.24)
GFR, Estimated: >60 mL/min (ref >60)
Glucose, Bld: 115 mg/dL — ABNORMAL HIGH (ref 70–99)
Potassium: 3.2 mmol/L — ABNORMAL LOW (ref 3.5–5.1)
Sodium: 138 mmol/L (ref 135–145)
Total Bilirubin: 0.8 mg/dL (ref 0.3–1.2)
Total Protein: 5.3 g/dL — ABNORMAL LOW (ref 6.5–8.1)

## 2021-06-19 LAB — CBG MONITORING, ED
Glucose-Capillary: 133 mg/dL — ABNORMAL HIGH (ref 70–99)
Glucose-Capillary: 154 mg/dL — ABNORMAL HIGH (ref 70–99)
Glucose-Capillary: 156 mg/dL — ABNORMAL HIGH (ref 70–99)
Glucose-Capillary: 168 mg/dL — ABNORMAL HIGH (ref 70–99)

## 2021-06-19 LAB — ECHOCARDIOGRAM COMPLETE
AR max vel: 2.13 cm2
AV Area VTI: 2.24 cm2
AV Area mean vel: 2 cm2
AV Mean grad: 2 mmHg
AV Peak grad: 4.4 mmHg
Ao pk vel: 1.05 m/s
Area-P 1/2: 1.84 cm2
Height: 71 in
MV VTI: 1.61 cm2
S' Lateral: 3 cm
Weight: 2880 oz

## 2021-06-19 LAB — CBC
HCT: 36.3 % — ABNORMAL LOW (ref 39.0–52.0)
Hemoglobin: 11.9 g/dL — ABNORMAL LOW (ref 13.0–17.0)
MCH: 31.1 pg (ref 26.0–34.0)
MCHC: 32.8 g/dL (ref 30.0–36.0)
MCV: 94.8 fL (ref 80.0–100.0)
Platelets: 161 10*3/uL (ref 150–400)
RBC: 3.83 MIL/uL — ABNORMAL LOW (ref 4.22–5.81)
RDW: 13.3 % (ref 11.5–15.5)
WBC: 9.1 10*3/uL (ref 4.0–10.5)
nRBC: 0 % (ref 0.0–0.2)

## 2021-06-19 LAB — HEMOGLOBIN A1C
Hgb A1c MFr Bld: 7.2 % — ABNORMAL HIGH (ref 4.8–5.6)
Mean Plasma Glucose: 159.94 mg/dL

## 2021-06-19 LAB — TROPONIN I (HIGH SENSITIVITY): Troponin I (High Sensitivity): <2 ng/L (ref <18)

## 2021-06-19 LAB — PROCALCITONIN: Procalcitonin: <0.1 ng/mL

## 2021-06-19 MED ORDER — ACETAMINOPHEN 500 MG PO TABS: 500.0000 mg | ORAL_TABLET | Freq: Three times a day (TID) | ORAL | Status: AC

## 2021-06-19 MED ORDER — METOPROLOL SUCCINATE ER 25 MG PO TB24
12.5000 mg | ORAL_TABLET | Freq: Every day | ORAL | Status: DC
  Filled 2021-06-19: qty 0.5

## 2021-06-19 MED ORDER — VITAMIN D3 25 MCG (1000 UNIT) PO TABS
2000.0000 [IU] | ORAL_TABLET | Freq: Every day | ORAL | Status: DC
  Administered 2021-06-19: 2000 [IU] via ORAL
  Filled 2021-06-19 (×2): qty 2

## 2021-06-19 MED ORDER — HYDRALAZINE HCL 20 MG/ML IJ SOLN: 5.0000 mg | Freq: Four times a day (QID) | INTRAMUSCULAR | Status: DC | PRN

## 2021-06-19 MED ORDER — TRAMADOL HCL 50 MG PO TABS: 50.0000 mg | ORAL_TABLET | Freq: Two times a day (BID) | ORAL | 0 refills | Status: DC | PRN

## 2021-06-19 MED ORDER — MELATONIN 5 MG PO TABS: 5.0000 mg | ORAL_TABLET | Freq: Every evening | ORAL | Status: DC | PRN

## 2021-06-19 NOTE — Consult Note (Addendum)
Cardiology Consultation:   Patient ID: Brett [name] MRN: [id number]; DOB: [name] 21, 1942  Admit date: 06/18/2021 Date of Consult: 06/19/2021  PCP:  Brett Mc, MD   [facility] Providers Cardiologist:  Brett [name] Physician requesting consult: Brett [name] Reason for consult: Syncope   Patient Profile:   [name] is a very pleasant 81 year old gentleman with history of  HTN,   DM II, HBA1C 6.7,  Hyperlipidemia,  CAD,  CABG x4 at [facility]   in 2006,  previous stents Depression/insomnia Who presents to the hospital after episode of syncope, hypotension, bradycardia   History of Present Illness:   [name] reports that he was in his usual state of health, 7 PM yesterday stood up out of her recliner to go to another room when wife appreciated a loud noise, found him on the ground.  Took him 15 seconds to recover, he was groggy, she tried several times to sit him up, EMTs called.  On their evaluation he was mentating better but on further observation had recurrent near syncope with low blood pressure and bradycardia.  Orthostatics positive with EMTs and in the emergency room as documented in H&P, 30-40 point drop with standing Wife reports he drinks soda, not much of other beverages, at bedtime taking his Micardis and Flomax at 4 PM, syncope at 7 PM. Recently started on Jardiance several weeks ago,  Denies any recent illnesses,no sick contacts  On my evaluation in the emergency room heart rate quickly down to high 40s and back up to low 50s Reports he took last dose of metoprolol succinate 12.5 yesterday morning    Past Medical History:  Diagnosis Date   Allergy    resolved after CABG   Calcific Achilles tendonitis    Calculus of ureter    Coronary artery disease    Diabetes mellitus    H/O renal calculi 2012   Hematuria, unspecified    Hyperlipidemia    Hypertension    Impotence of organic origin    Myocardial infarction [facility])    Peyronie's disease     Pulmonary nodule seen on imaging study 2012   Sleep difficulties    Swelling, mass, or lump in chest    Unspecified hypothyroidism     Past Surgical History:  Procedure Laterality Date   CARDIAC CATHETERIZATION  2001   [facility] x3 stent   COLONOSCOPY     COLONOSCOPY WITH PROPOFOL N/A 01/24/2017   Procedure: COLONOSCOPY WITH PROPOFOL;  Surgeon: Brett Silvas, MD;  Location: [facility] ENDOSCOPY;  Service: Endoscopy;  Laterality: N/A;   CORONARY ARTERY BYPASS GRAFT  2006   4 vessel, after 3 or 4 stents    CORONARY ARTERY BYPASS GRAFT     CYSTOSCOPY W/ RETROGRADES  01/18/2020   Procedure: CYSTOSCOPY WITH RETROGRADE PYELOGRAM;  Surgeon: Brett Co, MD;  Location: [facility] ORS;  Service: Urology;;   CYSTOSCOPY/URETEROSCOPY/HOLMIUM LASER/STENT PLACEMENT Right 01/18/2020   Procedure: CYSTOSCOPY/URETEROSCOPY/HOLMIUM LASER/STENT PLACEMENT;  Surgeon: Brett Co, MD;  Location: [facility] ORS;  Service: Urology;  Laterality: Right;   OSTECTOMY CALCANEUS Left    PENILE PROSTHESIS IMPLANT       Home Medications:  Prior to Admission medications   Medication Sig Start Date End Date Taking? Authorizing Provider  atorvastatin (LIPITOR) 80 MG tablet Take 1 tablet (80 mg total) by mouth daily. 09/10/20  Yes Brett Merritts, MD  Cholecalciferol (VITAMIN D) 50 MCG (2000 UT) tablet Take 2,000 Units by mouth daily.   Yes [provider]  DULoxetine (CYMBALTA) 30 MG capsule TAKE 1 CAPSULE BY MOUTH EVERY DAY 02/12/21  Yes Brett Mc, MD  empagliflozin (JARDIANCE) 10 MG TABS tablet Take 1 tablet (10 mg total) by mouth daily before breakfast. 06/04/21  Yes Brett Mc, MD  ezetimibe (ZETIA) 10 MG tablet Take 1 tablet (10 mg total) by mouth daily. 09/10/20  Yes Brett Merritts, MD  FREESTYLE LITE test strip CHECK BLOOD SUGAR DAILY AS DIRECTED 07/28/20  Yes Brett Mc, MD  glucose monitoring kit (FREESTYLE) monitoring kit 1 each by Does not apply route as needed for other. For use  daily to monitor diabetes.  Please include lancets .  Test once daily   E11.9 07/18/15  Yes Brett Mc, MD  Lancets (FREESTYLE) lancets Use as instructed 08/04/15  Yes Brett Mc, MD  levothyroxine (SYNTHROID) 75 MCG tablet TAKE 1 TABLET BY MOUTH EVERY DAY 05/06/21  Yes Brett Mc, MD  metoprolol succinate (TOPROL-XL) 25 MG 24 hr tablet Take 0.5 tablets (12.5 mg total) by mouth daily. Take with or immediately following a meal. 09/10/20  Yes Brett [name], Brett November, MD  tamsulosin (FLOMAX) 0.4 MG CAPS capsule TAKE 1 CAPSULE (0.4 MG TOTAL) DAILY BY MOUTH. 04/22/21  Yes Brett Mc, MD  telmisartan (MICARDIS) 20 MG tablet Take 0.5 tablets (10 mg total) by mouth daily. 09/10/20  Yes Brett Merritts, MD  buPROPion (WELLBUTRIN XL) 150 MG 24 hr tablet TAKE 1 TABLET BY MOUTH EVERY DAY Patient not taking: Reported on 02/17/2021 11/05/20   Brett Mc, MD    Inpatient Medications: Scheduled Meds:  atorvastatin  80 mg Oral QHS   cholecalciferol  2,000 Units Oral Daily   DULoxetine  30 mg Oral Daily   enoxaparin (LOVENOX) injection  40 mg Subcutaneous Q24H   ezetimibe  10 mg Oral Daily   insulin aspart  0-15 Units Subcutaneous TID WC   insulin aspart  0-5 Units Subcutaneous QHS   levothyroxine  75 mcg Oral Q0600   sodium chloride flush  3 mL Intravenous Q12H   Continuous Infusions:  PRN Meds: acetaminophen **OR** acetaminophen, hydrALAZINE, melatonin, traMADol  Allergies:    Allergies  Allergen Reactions   Simvastatin Other (See Comments)   Eszopiclone Rash    Social History:   Social History   Socioeconomic History   Marital status: Married    Spouse name: Brett [name] now deceased   Number of children: Not on file   Years of education: Not on file   Highest education level: Not on file  Occupational History    Employer: retired  Tobacco Use   Smoking status: Former    Types: Cigars    Quit date: 10/04/1981    Years since quitting: 39.7   Smokeless tobacco: Never  Vaping Use    Vaping Use: Never used  Substance and Sexual Activity   Alcohol use: Yes    Alcohol/week: 1.0 - 2.0 standard drink    Types: 1 - 2 Cans of beer per week    Comment: everyday   Drug use: No   Sexual activity: Not Currently  Other Topics Concern   Not on file  Social History Narrative   Not on file   Social Determinants of Health   Financial Resource Strain: Low Risk    Difficulty of Paying Living Expenses: Not hard at all  Food Insecurity: No Food Insecurity   Worried About Charity fundraiser in the Last Year: Never true   [employer]  in the Last Year: Never true  Transportation Needs: No Transportation Needs   Lack of Transportation (Medical): No   Lack of Transportation (Non-Medical): No  Physical Activity: Sufficiently Active   Days of Exercise per Week: 3 days   Minutes of Exercise per Session: 50 min  Stress: No Stress Concern Present   Feeling of Stress : Not at all  Social Connections: Unknown   Frequency of Communication with Friends and Family: Not on file   Frequency of Social Gatherings with Friends and Family: Not on file   Attends Religious Services: Not on Electrical engineer or Organizations: Not on file   Attends Archivist Meetings: Not on file   Marital Status: Married  Human resources officer Violence: Not At Risk   Fear of Current or Ex-Partner: No   Emotionally Abused: No   Physically Abused: No   Sexually Abused: No    Family History:    Family History  Problem Relation Age of Onset   Hyperlipidemia Mother    AAA (abdominal aortic aneurysm) Mother    Heart disease Father 66     ROS:  Please see the history of present illness.  Review of Systems  Constitutional: Negative.   HENT: Negative.    Respiratory: Negative.    Cardiovascular: Negative.   Gastrointestinal: Negative.   Musculoskeletal: Negative.   Neurological: Negative.   Psychiatric/Behavioral: Negative.    All other systems reviewed and are  negative.   Physical Exam/Data:   Vitals:   06/19/21 1000 06/19/21 1030 06/19/21 1100 06/19/21 1130  BP: 127/63 126/70 120/89 119/64  Pulse: (!) 54 (!) 51 (!) 50 (!) 53  Resp: 16 (!) _0 Temp:      TempSrc:      SpO2: 94% 94% 96%   Weight:      Height:        Intake/Output Summary (Last 24 hours) at 06/19/2021 1245 Last data filed at 06/19/2021 3419 Gross per 24 hour  Intake 1000 ml  Output 525 ml  Net 475 ml   Last 3 Weights 06/18/2021 05/26/2021 10/09/2020  Weight (lbs) 180 lb 172 lb 172 lb 6.4 oz  Weight (kg) 81.647 kg 78.019 kg 78.2 kg     Body mass index is 25.1 kg/m.  General:  Well nourished, well developed, in no acute distress HEENT: normal Neck: no JVD Vascular: No carotid bruits; Distal pulses 2+ bilaterally Cardiac:  normal S1, S2; RRR; no murmur  Lungs:  clear to auscultation bilaterally, no wheezing, rhonchi or rales  Abd: soft, nontender, no hepatomegaly  Ext: no edema Musculoskeletal:  No deformities, BUE and BLE strength normal and equal Skin: warm and dry  Neuro:  CNs 2-12 intact, no focal abnormalities noted Psych:  Normal affect   EKG:  The EKG was personally reviewed and demonstrates:   Sinus bradycardia  Telemetry:  Telemetry was personally reviewed and demonstrates:   Sinus bradycardia  Relevant CV Studies: Echo ]low normal EF No valvular heart disease  Laboratory Data:  High Sensitivity Troponin:   Recent Labs  Lab 06/18/21 2044 06/19/21 0034  TROPONINIHS 3 <2     Chemistry Recent Labs  Lab 06/18/21 2044 06/19/21 0539  NA 138 138  K 4.0 3.2*  CL 103 110  CO2 26 22  GLUCOSE 153* 115*  BUN 25* 21  CREATININE 1.04 0.63  CALCIUM 9.3 7.3*  GFRNONAA >60 >60  ANIONGAP 9 6    Recent Labs  Lab 06/18/21 2044  06/19/21 0539  PROT 7.1 5.3*  ALBUMIN 4.0 3.1*  AST 28 21  ALT 25 19  ALKPHOS 43 39  BILITOT 1.0 0.8   Lipids No results for input(s): CHOL, TRIG, HDL, LABVLDL, LDLCALC, CHOLHDL in the last 168 hours.   Hematology Recent Labs  Lab 06/18/21 2044 06/19/21 0539  WBC 7.2 9.1  RBC 4.34 3.83*  HGB 13.2 11.9*  HCT 40.9 36.3*  MCV 94.2 94.8  MCH 30.4 31.1  MCHC 32.3 32.8  RDW 13.3 13.3  PLT 187 161   Thyroid  Recent Labs  Lab 06/18/21 2044  TSH 21.200*  FREET4 0.69    BNPNo results for input(s): BNP, PROBNP in the last 168 hours.  DDimer No results for input(s): DDIMER in the last 168 hours.   Radiology/Studies:  X-ray chest PA and lateral  Result Date: 06/18/2021 CLINICAL DATA:  Near syncopal episode. EXAM: CHEST - 2 VIEW COMPARISON:  Chest CT 11/22/2016, lung base images of CT abdomen and pelvis 12/31/2019. FINDINGS: The cardiac size is normal with old CABG changes. No vascular congestion is seen. Small chronic left pleural effusion is again noted with associated coarse pleuroparenchymal stranding opacities chronically noted in the left base, as well as a pleural-based 1.8 cm rounded atelectasis chronically seen in the lateral left lower lung field. The lungs are clear of acute infiltrates and otherwise clear. No right pleural collection is seen. There is mild osteopenia with degenerative changes of the spine. IMPRESSION: No active cardiopulmonary disease. 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 Electronically Signed   By: Telford Nab M.D.   On: 06/18/2021 23:55   CT HEAD WO CONTRAST (5MM)  Result Date: 06/18/2021 CLINICAL DATA:  Syncope.  Head trauma, minor (Age >= 65y) EXAM: CT HEAD WITHOUT CONTRAST TECHNIQUE: Contiguous axial images were obtained from the base of the skull through the vertex without intravenous contrast. RADIATION DOSE REDUCTION: This exam was performed according to the departmental dose-optimization program which includes automated exposure control, adjustment of the mA and/or kV according to patient size and/or use of iterative reconstruction technique. COMPARISON:  None. FINDINGS: Brain: Old right lacunar  infarct in the right basal ganglia. No acute intracranial abnormality. Specifically, no hemorrhage, hydrocephalus, mass lesion, acute infarction, or significant intracranial injury. Vascular: No hyperdense vessel or unexpected calcification. Skull: No acute calvarial abnormality. Sinuses/Orbits: No acute findings Other: None IMPRESSION: No acute intracranial abnormality. Electronically Signed   By: Rolm Baptise M.D.   On: 06/18/2021 22:16   CT Cervical Spine Wo Contrast  Result Date: 06/18/2021 CLINICAL DATA:  Neck trauma (Age >= 65y).  Syncope EXAM: CT CERVICAL SPINE WITHOUT CONTRAST TECHNIQUE: Multidetector CT imaging of the cervical spine was performed without intravenous contrast. Multiplanar CT image reconstructions were also generated. RADIATION DOSE REDUCTION: This exam was performed according to the departmental dose-optimization program which includes automated exposure control, adjustment of the mA and/or kV according to patient size and/or use of iterative reconstruction technique. COMPARISON:  None FINDINGS: Alignment: Normal. Skull base and vertebrae: No acute fracture. No primary bone lesion or focal pathologic process. Soft tissues and spinal canal: No prevertebral fluid or swelling. No visible canal hematoma. Disc levels: Diffuse degenerative disc disease with disc space narrowing and spurring most pronounced at C5-6 and C6-7. Moderate bilateral degenerative facet disease diffusely. Upper chest: No acute findings Other: None IMPRESSION: Degenerative disc and facet disease. No acute bony abnormality. Electronically Signed   By: Rolm Baptise M.D.   On: 06/18/2021 22:17  CT Thoracic Spine Wo Contrast  Result Date: 06/18/2021 CLINICAL DATA:  Trauma syncope EXAM: CT THORACIC SPINE WITHOUT CONTRAST TECHNIQUE: Multidetector CT images of the thoracic were obtained using the standard protocol without intravenous contrast. RADIATION DOSE REDUCTION: This exam was performed according to the departmental  dose-optimization program which includes automated exposure control, adjustment of the mA and/or kV according to patient size and/or use of iterative reconstruction technique. COMPARISON:  CT 12/31/2019 FINDINGS: Alignment: Normal. Vertebrae: No acute fracture or focal pathologic process. Paraspinal and other soft tissues: Partially visualized left pleural thickening and presumed round atelectasis at the left base. No paravertebral or paraspinal soft tissue abnormality. Disc levels: Multilevel degenerative osteophytes. No abnormal disc space narrowing or widening IMPRESSION: No acute osseous abnormality Electronically Signed   By: Donavan Foil M.D.   On: 06/18/2021 22:38   CT Lumbar Spine Wo Contrast  Result Date: 06/18/2021 CLINICAL DATA:  Fall EXAM: CT LUMBAR SPINE WITHOUT CONTRAST TECHNIQUE: Multidetector CT imaging of the lumbar spine was performed without intravenous contrast administration. Multiplanar CT image reconstructions were also generated. RADIATION DOSE REDUCTION: This exam was performed according to the departmental dose-optimization program which includes automated exposure control, adjustment of the mA and/or kV according to patient size and/or use of iterative reconstruction technique. COMPARISON:  CT 12/31/2019 FINDINGS: Segmentation: 5 lumbar type vertebrae. Alignment: Normal. Vertebrae: No acute fracture or focal pathologic process. Paraspinal and other soft tissues: Aortic atherosclerosis. No paravertebral or paraspinal soft tissue abnormality. Disc levels: At L1-L2, no canal stenosis. Facet degenerative changes. The foramen are patent bilaterally. At L1-L2, maintained disc space. Mild diffuse disc bulge. No canal stenosis. Facet degenerative changes. The foramen are patent. At L3-L4, mild diffuse disc bulge. No canal stenosis. Moderate facet degenerative changes. The foramen are patent bilaterally. At L4-L5, mild diffuse disc bulge. Moderate facet degenerative changes. No canal stenosis.  There is mild bilateral foraminal narrowing. At L5-S1, marked disc space narrowing. No canal stenosis. Advanced degenerative changes of the bilateral facets. There is moderate bilateral foraminal narrowing. IMPRESSION: 1. No acute osseous abnormality. 2. Multilevel degenerative changes, worst at L5-S1 Electronically Signed   By: Donavan Foil M.D.   On: 06/18/2021 22:44     Assessment and Plan:   Syncope Markedly orthostatic in the field and in the ER on arrival Likely polypharmacy including Flomax, metoprolol, Micardis, Jardiance, hypokalemia -Also in the setting of bradycardia on metoprolol --Recommendation to hold metoprolol, Micardis, Flomax Recommend to increase fluid intake not just soda --We have ordered a ZIO monitor given his bradycardia Close follow-up in clinic after monitor results available He will monitor blood pressure at home as his fluid intake  Coronary artery disease with stable angina History of CABG times 09/2004, prior stenting Denies anginal symptoms, no further ischemic work-up needed at this time Recommended he start aspirin 81 daily Continue statin, Zetia  Diabetes type 2 with complications W1X running in the 7 range,  Diet discussed with him, lots of sweets/desserts  Though he has had weight loss over the past year Reports having dramatic weight loss on Invokana, changed to Jardiance Unclear if this is causing polyuria and hypovolemia  Bradycardia Heart rates into the high 40s on arrival, again during my exam briefly May have contributed to syncope Will hold all beta-blocker Zio monitor  Old stroke Noted on CT scan head, patient and family unaware They are declining head MRI Recommended aggressive diabetes, cholesterol control, baby aspirin   Total encounter time more than 110 minutes  Greater than 50% was spent  in counseling and coordination of care with the patient   For questions or updates, please contact [place] Please consult  [email or web address] for contact info under    Signed, Ida Rogue, MD  06/19/2021 12:45 PM

## 2021-06-19 NOTE — Telephone Encounter (Signed)
-----   Message from Minna Merritts, MD sent at 06/19/2021  1:14 PM EST ----- Seen in the emergency room for syncope by myself Needs follow-up in 1 month, wearing a ZIO monitor should have results by then Thx TG

## 2021-06-19 NOTE — Telephone Encounter (Signed)
Attempted to schedule. No ans no vm . Patient still in ED

## 2021-06-19 NOTE — Assessment & Plan Note (Addendum)
TSH slightly up.  Probably not enough to contribute to bradycardia, less likely even to syncope.  Defer adjustment of levothyroxine to PCP.

## 2021-06-19 NOTE — ED Notes (Signed)
Pt completed his breakfast, states that it didn't taste very good, family at bedside, no needs voiced at this time

## 2021-06-19 NOTE — Care Management Obs Status (Signed)
[place] NOTIFICATION   Patient Details  Name: Brett [name] MRN: [id number] Date of Birth: 1941/05/02   Medicare Observation Status Notification Given:  Yes    Anselm Pancoast, RN 06/19/2021, 12:53 PM

## 2021-06-19 NOTE — Discharge Summary (Signed)
Physician Discharge Summary   Patient: Brett [name] MRN: [id number] DOB: February 15, 1941  Admit date:     06/18/2021  Discharge date: 06/19/21  Discharge Physician: Edwin Dada   PCP: Crecencio Mc, MD   Recommendations at discharge:   Follow up with Dr. Derrel Nip in 1 week Follow up with Cardiology, Dr. Rockey Situ in 1 month after cardiac monitor Dr. Derrel Nip: Please review TSH and adjust T4 as you see fit Dr. Derrel Nip: Please check BP in 1 week and resume telmisartan as needed      Discharge Diagnoses Principal Problem:   Syncope Active Problems:   Hypothyroidism   Hypertension associated with type 2 diabetes mellitus ([place])   Hyperlipidemia LDL goal <70   Depression with anxiety   CAD (coronary artery disease)   Insomnia   Orthostatic hypotension   Hospital Course   Brett [name] is a 81 y.o. M with HTN, NIDDM and hypothyroidism who presented with a syncopal episode.  Patient started new Jardiance about 2 weeks ago. On day of admission, he took his normal metoprolol, took the new Jardiance, and later, got up to walk from one room to the other, and passed out.    In the ER, ECG showed a sinus bradycardia, similar to previous.  He was orthostatic.  BMP unremarkable.  CBC normal, troponin normal, TSH slightly elevated.      * Syncope- (present on admission) Patient had prominent orthostasis on BP checks (although asymptomatic, and ambulated without symptoms).  Echo unremarkable.  ECG unchanged from previous.  Cardiology recommended holding metoprolol and placing 30 day cardiac event monitor, and follow up with Cardiology in 1 month.  Given coincidence of new Jardiance and orthostasis and syncope, we have [name]: Jardiance Micardis Flomax  Resume Micardis in 1 week if BP increases.    Defer Jardiance restart to PCP.    Flomax reportedly not that helpful, can be stopped in that case.  Hypothyroidism- (present on admission) TSH slightly up.  Probably not enough to  contribute to bradycardia, less likely even to syncope.  Defer adjustment of levothyroxine to PCP.  Orthostatic hypotension    Insomnia- (present on admission)    CAD (coronary artery disease)- (present on admission)    Depression with anxiety- (present on admission)    Hyperlipidemia LDL goal <70- (present on admission)    Hypertension associated with type 2 diabetes mellitus ([place])- (present on admission)    Pulmonary nodule seen on imaging study-resolved as of 06/19/2021, (present on admission)        Pain control - [place] was reviewed. and patient was instructed, not to drive, operate heavy machinery, perform activities at heights, swimming or participation in water activities or provide baby-sitting services while on Pain, Sleep and Anxiety Medications; until their outpatient Physician has advised to do so again. Also recommended to not to take more than prescribed Pain, Sleep and Anxiety Medications.   Consultants: Cardiology Procedures performed: Echo  Disposition: Home Diet recommendation: Regular diet  DISCHARGE MEDICATION: Allergies as of 06/19/2021       Reactions   Simvastatin Other (See Comments)   Eszopiclone Rash        Medication List     STOP taking these medications    buPROPion 150 MG 24 hr tablet Commonly known as: WELLBUTRIN XL   empagliflozin 10 MG Tabs tablet Commonly known as: Jardiance   metoprolol succinate 25 MG 24 hr tablet Commonly known as: TOPROL-XL   tamsulosin 0.4 MG Caps capsule Commonly  known as: FLOMAX   telmisartan 20 MG tablet Commonly known as: MICARDIS       TAKE these medications    acetaminophen 500 MG tablet Commonly known as: TYLENOL Take 1-2 tablets (500-1,000 mg total) by mouth in the morning, at noon, and at bedtime.   atorvastatin 80 MG tablet Commonly known as: LIPITOR Take 1 tablet (80 mg total) by mouth daily.   DULoxetine 30 MG  capsule Commonly known as: CYMBALTA TAKE 1 CAPSULE BY MOUTH EVERY DAY   ezetimibe 10 MG tablet Commonly known as: ZETIA Take 1 tablet (10 mg total) by mouth daily.   freestyle lancets Use as instructed   FREESTYLE LITE test strip Generic drug: glucose blood CHECK BLOOD SUGAR DAILY AS DIRECTED   glucose monitoring kit monitoring kit 1 each by Does not apply route as needed for other. For use daily to monitor diabetes.  Please include lancets .  Test once daily   E11.9   levothyroxine 75 MCG tablet Commonly known as: SYNTHROID TAKE 1 TABLET BY MOUTH EVERY DAY   traMADol 50 MG tablet Commonly known as: ULTRAM Take 1 tablet (50 mg total) by mouth 2 (two) times daily as needed for moderate pain.   Vitamin D 50 MCG (2000 UT) tablet Take 2,000 Units by mouth daily.        Follow-up Information     Crecencio Mc, MD. Schedule an appointment as soon as possible for a visit in 1 week(s).   Specialty: Internal Medicine Contact information: [address] [place] [address] [phone]         Minna Merritts, MD. Schedule an appointment as soon as possible for a visit.   Specialty: Cardiology Why: After Zio patch is complete Contact information: [place] [address] [phone]                Discharge Instructions     Diet - low sodium heart healthy   Complete by: As directed    Discharge instructions   Complete by: As directed    From Dr. Loleta Books: You were admitted for syncope.  Here, your electrolytes and renal function were normal, your blood counts were normal. You had an echocardiogram (ultrasound of the heart) that showed normal heart function and valves  Your EKG (electrocardiogram) also showed no change from previous.  We did notice that your blood pressure dropped with standing.  This may be due to not drinking enough water, in combination with some of your medicines.  For now: STOP  Jardiance and Micardis  Go see Dr. Derrel Nip next week If your blood pressure is going back up, or is back up when you see Dr. Derrel Nip, restart the Micardis. Have Dr. Derrel Nip review your thyroid medicine  Ask her about restarting the Jardiance or stopping it permanently   In the meantime, get the cardiac monitor (Zio patch) from Cardiology Go see Dr. Rockey Situ when the [facility] patch is complete Do not take your metoprolol until you see Dr. Rockey Situ  Stop Flomax   Return to the hospital for chest pain, trouble breathing, passing out again, severe dizziness with standing, trouble speaking or weakness/numbness on one side of the body only   Increase activity slowly   Complete by: As directed         Discharge Exam: Filed Weights   06/18/21 2053  Weight: 81.6 kg   General: Pt is alert, awake, not in acute distress Cardiovascular: RRR, nl S1-S2, no  murmurs appreciated.   No LE edema.   Respiratory: Normal respiratory rate and rhythm.  CTAB without rales or wheezes. Abdominal: Abdomen soft and non-tender.  No distension or HSM.   Neuro/Psych: Strength symmetric in upper and lower extremities.  Judgment and insight appear normal.   Condition at discharge: Good  The results of significant diagnostics from this hospitalization (including imaging, microbiology, ancillary and laboratory) are listed below for reference.   Imaging Studies: X-ray chest PA and lateral  Result Date: 06/18/2021 CLINICAL DATA:  Near syncopal episode. EXAM: CHEST - 2 VIEW COMPARISON:  Chest CT 11/22/2016, lung base images of CT abdomen and pelvis 12/31/2019. FINDINGS: The cardiac size is normal with old CABG changes. No vascular congestion is seen. Small chronic left pleural effusion is again noted with associated coarse pleuroparenchymal stranding opacities chronically noted in the left base, as well as a pleural-based 1.8 cm rounded atelectasis chronically seen in the lateral left lower lung field. The lungs are clear of  acute infiltrates and otherwise clear. No right pleural collection is seen. There is mild osteopenia with degenerative changes of the spine. IMPRESSION: No active cardiopulmonary disease. 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 Electronically Signed   By: Telford Nab M.D.   On: 06/18/2021 23:55   CT HEAD WO CONTRAST (5MM)  Result Date: 06/18/2021 CLINICAL DATA:  Syncope.  Head trauma, minor (Age >= 65y) EXAM: CT HEAD WITHOUT CONTRAST TECHNIQUE: Contiguous axial images were obtained from the base of the skull through the vertex without intravenous contrast. RADIATION DOSE REDUCTION: This exam was performed according to the departmental dose-optimization program which includes automated exposure control, adjustment of the mA and/or kV according to patient size and/or use of iterative reconstruction technique. COMPARISON:  None. 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 No acute intracranial abnormality. Specifically, no hemorrhage, hydrocephalus, mass lesion, acute infarction, or significant intracranial injury. Vascular: No hyperdense vessel or unexpected calcification. Skull: No acute calvarial abnormality. Sinuses/Orbits: No acute findings Other: None IMPRESSION: No acute intracranial abnormality. Electronically Signed   By: Rolm Baptise M.D.   On: 06/18/2021 22:16   CT Cervical Spine Wo Contrast  Result Date: 06/18/2021 CLINICAL DATA:  Neck trauma (Age >= 65y).  Syncope EXAM: CT CERVICAL SPINE WITHOUT CONTRAST TECHNIQUE: Multidetector CT imaging of the cervical spine was performed without intravenous contrast. Multiplanar CT image reconstructions were also generated. RADIATION DOSE REDUCTION: This exam was performed according to the departmental dose-optimization program which includes automated exposure control, adjustment of the mA and/or kV according to patient size and/or use of iterative reconstruction technique.  COMPARISON:  None FINDINGS: Alignment: Normal. Skull base and vertebrae: No acute fracture. No primary bone lesion or focal pathologic process. Soft tissues and spinal canal: No prevertebral fluid or swelling. No visible canal hematoma. Disc levels: Diffuse degenerative disc disease with disc space narrowing and spurring most pronounced at C5-6 and C6-7. Moderate bilateral degenerative facet disease diffusely. Upper chest: No acute findings Other: None IMPRESSION: Degenerative disc and facet disease. No acute bony abnormality. Electronically Signed   By: Rolm Baptise M.D.   On: 06/18/2021 22:17   CT Thoracic Spine Wo Contrast  Result Date: 06/18/2021 CLINICAL DATA:  Trauma syncope EXAM: CT THORACIC SPINE WITHOUT CONTRAST TECHNIQUE: Multidetector CT images of the thoracic were obtained using the standard protocol without intravenous contrast. RADIATION DOSE REDUCTION: This exam was performed according to the departmental dose-optimization program which includes automated exposure control, adjustment of the mA  and/or kV according to patient size and/or use of iterative reconstruction technique. COMPARISON:  CT 12/31/2019 FINDINGS: Alignment: Normal. Vertebrae: No acute fracture or focal pathologic process. Paraspinal and other soft tissues: Partially visualized left pleural thickening and presumed round atelectasis at the left base. No paravertebral or paraspinal soft tissue abnormality. Disc levels: Multilevel degenerative osteophytes. No abnormal disc space narrowing or widening IMPRESSION: No acute osseous abnormality Electronically Signed   By: Donavan Foil M.D.   On: 06/18/2021 22:38   CT Lumbar Spine Wo Contrast  Result Date: 06/18/2021 CLINICAL DATA:  Fall EXAM: CT LUMBAR SPINE WITHOUT CONTRAST TECHNIQUE: Multidetector CT imaging of the lumbar spine was performed without intravenous contrast administration. Multiplanar CT image reconstructions were also generated. RADIATION DOSE REDUCTION: This exam  was performed according to the departmental dose-optimization program which includes automated exposure control, adjustment of the mA and/or kV according to patient size and/or use of iterative reconstruction technique. COMPARISON:  CT 12/31/2019 FINDINGS: Segmentation: 5 lumbar type vertebrae. Alignment: Normal. Vertebrae: No acute fracture or focal pathologic process. Paraspinal and other soft tissues: Aortic atherosclerosis. No paravertebral or paraspinal soft tissue abnormality. Disc levels: At L1-L2, no canal stenosis. Facet degenerative changes. The foramen are patent bilaterally. At L1-L2, maintained disc space. Mild diffuse disc bulge. No canal stenosis. Facet degenerative changes. The foramen are patent. At L3-L4, mild diffuse disc bulge. No canal stenosis. Moderate facet degenerative changes. The foramen are patent bilaterally. At L4-L5, mild diffuse disc bulge. Moderate facet degenerative changes. No canal stenosis. There is mild bilateral foraminal narrowing. At L5-S1, marked disc space narrowing. No canal stenosis. Advanced degenerative changes of the bilateral facets. There is moderate bilateral foraminal narrowing. IMPRESSION: 1. No acute osseous abnormality. 2. Multilevel degenerative changes, worst at L5-S1 Electronically Signed   By: Donavan Foil M.D.   On: 06/18/2021 22:44   ECHOCARDIOGRAM COMPLETE  Result Date: 06/19/2021    ECHOCARDIOGRAM REPORT   Patient Name:   Brett [name] Date of Exam: 06/19/2021 Medical Rec #:  [id number]  Height:       71.0 in Accession #:    [id number] Weight:       180.0 lb Date of Birth:  [birth year] BSA:          2.016 m Patient Age:    60 years   BP:           112/69 mmHg Patient Gender: M          HR:           53 bpm. Exam Location:  [facility] Procedure: 2D Echo, Cardiac Doppler and Color Doppler Indications:     Syncope R55  History:         Patient has no prior history of Echocardiogram examinations.                  Previous Myocardial Infarction; Risk  Factors:Diabetes and                  Hypertension.  Sonographer:     Sherrie Sport Referring Phys:  [id number] AMY N COX Diagnosing Phys: Ida Rogue MD IMPRESSIONS  1. Left ventricular ejection fraction, by estimation, is 60 to 65%. The left ventricle has normal function. The left ventricle has no regional wall motion abnormalities. Left ventricular diastolic parameters are consistent with Grade I diastolic dysfunction (impaired relaxation).  2. Right ventricular systolic function is normal. The right ventricular size is normal.  3. Left atrial size was mildly dilated.  4. The mitral valve  is normal in structure. Mild mitral valve regurgitation. No evidence of mitral stenosis.  5. The aortic valve is normal in structure. Aortic valve regurgitation is not visualized. Aortic valve sclerosis/calcification is present, without any evidence of aortic stenosis.  6. The inferior vena cava is normal in size with greater than 50% respiratory variability, suggesting right atrial pressure of 3 mmHg. FINDINGS  Left Ventricle: Left ventricular ejection fraction, by estimation, is 60 to 65%. The left ventricle has normal function. The left ventricle has no regional wall motion abnormalities. The left ventricular internal cavity size was normal in size. There is  no left ventricular hypertrophy. Left ventricular diastolic parameters are consistent with Grade I diastolic dysfunction (impaired relaxation). Right Ventricle: The right ventricular size is normal. No increase in right ventricular wall thickness. Right ventricular systolic function is normal. Left Atrium: Left atrial size was mildly dilated. Right Atrium: Right atrial size was normal in size. Pericardium: There is no evidence of pericardial effusion. Mitral Valve: The mitral valve is normal in structure. Mild mitral valve regurgitation. No evidence of mitral valve stenosis. MV peak gradient, 3.6 mmHg. The mean mitral valve gradient is 1.0 mmHg. Tricuspid Valve: The  tricuspid valve is normal in structure. Tricuspid valve regurgitation is not demonstrated. No evidence of tricuspid stenosis. Aortic Valve: The aortic valve is normal in structure. Aortic valve regurgitation is not visualized. Aortic valve sclerosis/calcification is present, without any evidence of aortic stenosis. Aortic valve mean gradient measures 2.0 mmHg. Aortic valve peak  gradient measures 4.4 mmHg. Aortic valve area, by VTI measures 2.24 cm. Pulmonic Valve: The pulmonic valve was normal in structure. Pulmonic valve regurgitation is not visualized. No evidence of pulmonic stenosis. Aorta: The aortic root is normal in size and structure. Venous: The inferior vena cava is normal in size with greater than 50% respiratory variability, suggesting right atrial pressure of 3 mmHg. IAS/Shunts: No atrial level shunt detected by color flow Doppler.  LEFT VENTRICLE PLAX 2D LVIDd:         4.60 cm   Diastology LVIDs:         3.00 cm   LV e' medial:    7.62 cm/s LV PW:         1.00 cm   LV E/e' medial:  7.4 LV IVS:        1.00 cm   LV e' lateral:   12.50 cm/s LVOT diam:     2.00 cm   LV E/e' lateral: 4.5 LV SV:         47 LV SV Index:   23 LVOT Area:     3.14 cm  RIGHT VENTRICLE RV Basal diam:  3.40 cm RV S prime:     9.79 cm/s TAPSE (M-mode): 1.6 cm LEFT ATRIUM           Index        RIGHT ATRIUM           Index LA diam:      4.50 cm 2.23 cm/m   RA Area:     21.30 cm LA Vol (A2C): 40.0 ml 19.84 ml/m  RA Volume:   66.50 ml  32.98 ml/m LA Vol (A4C): 59.4 ml 29.46 ml/m  AORTIC VALVE                    PULMONIC VALVE AV Area (Vmax):    2.13 cm     PV Vmax:        0.56 m/s AV Area (Vmean):  2.00 cm     PV Vmean:       39.800 cm/s AV Area (VTI):     2.24 cm     PV VTI:         0.124 m AV Vmax:           105.00 cm/s  PV Peak grad:   1.3 mmHg AV Vmean:          73.600 cm/s  PV Mean grad:   1.0 mmHg AV VTI:            0.209 m      RVOT Peak grad: 3 mmHg AV Peak Grad:      4.4 mmHg AV Mean Grad:      2.0 mmHg LVOT Vmax:          71.10 cm/s LVOT Vmean:        46.800 cm/s LVOT VTI:          0.149 m LVOT/AV VTI ratio: 0.71  AORTA Ao Root diam: 3.10 cm MITRAL VALVE               TRICUSPID VALVE MV Area (PHT): 1.84 cm    TR Peak grad:   14.6 mmHg MV Area VTI:   1.61 cm    TR Vmax:        191.00 cm/s MV Peak grad:  3.6 mmHg MV Mean grad:  1.0 mmHg    SHUNTS MV Vmax:       0.95 m/s    Systemic VTI:  0.15 m MV Vmean:      46.4 cm/s   Systemic Diam: 2.00 cm MV Decel Time: 413 msec    Pulmonic VTI:  0.158 m MV E velocity: 56.10 cm/s MV A velocity: 78.80 cm/s MV E/A ratio:  0.71 Ida Rogue MD Electronically signed by Ida Rogue MD Signature Date/Time: 06/19/2021/6:02:52 PM    Final     Microbiology: Results for orders placed or performed during the hospital encounter of 06/18/21  Resp Panel by RT-PCR (Flu A&B, Covid) Nasopharyngeal Swab     Status: None   Collection Time: 06/18/21  9:18 PM   Specimen: Nasopharyngeal Swab; Nasopharyngeal(NP) swabs in vial transport medium  Result Value Ref Range Status   SARS Coronavirus 2 by RT PCR NEGATIVE NEGATIVE Final    Comment: (NOTE) SARS-CoV-2 target nucleic acids are NOT DETECTED.  The SARS-CoV-2 RNA is generally detectable in upper respiratory specimens during the acute phase of infection. The lowest concentration of SARS-CoV-2 viral copies this assay can detect is 138 copies/mL. A negative result does not preclude SARS-Cov-2 infection and should not be used as the sole basis for treatment or other patient management decisions. A negative result may occur with  improper specimen collection/handling, submission of specimen other than nasopharyngeal swab, presence of viral mutation(s) within the areas targeted by this assay, and inadequate number of viral copies(<138 copies/mL). A negative result must be combined with clinical observations, patient history, and epidemiological information. The expected result is Negative.  Fact Sheet for Patients:   EntrepreneurPulse.com.au  Fact Sheet for Healthcare Providers:  IncredibleEmployment.be  This test is no t yet approved or cleared by the [place] FDA and  has been authorized for detection and/or diagnosis of SARS-CoV-2 by FDA under an Emergency Use Authorization (EUA). This EUA will remain  in effect (meaning this test can be used) for the duration of the COVID-19 declaration under Section 564(b)(1) of the Act, 21 U.S.C.section 360bbb-3(b)(1), unless the authorization is terminated  or revoked sooner.       Influenza A by PCR NEGATIVE NEGATIVE Final   Influenza B by PCR NEGATIVE NEGATIVE Final    Comment: (NOTE) The Xpert Xpress SARS-CoV-2/FLU/RSV plus assay is intended as an aid in the diagnosis of influenza from Nasopharyngeal swab specimens and should not be used as a sole basis for treatment. Nasal washings and aspirates are unacceptable for Xpert Xpress SARS-CoV-2/FLU/RSV testing.  Fact Sheet for Patients: EntrepreneurPulse.com.au  Fact Sheet for Healthcare Providers: IncredibleEmployment.be  This test is not yet approved or cleared by the [place] FDA and has been authorized for detection and/or diagnosis of SARS-CoV-2 by FDA under an Emergency Use Authorization (EUA). This EUA will remain in effect (meaning this test can be used) for the duration of the COVID-19 declaration under Section 564(b)(1) of the Act, 21 U.S.C. section 360bbb-3(b)(1), unless the authorization is terminated or revoked.  Performed at [facility], [address] [place], [place] [address]     Labs: CBC: Recent Labs  Lab 06/18/21 2044 06/19/21 0539  WBC 7.2 9.1  HGB 13.2 11.9*  HCT 40.9 36.3*  MCV 94.2 94.8  PLT 187 173   Basic Metabolic Panel: Recent Labs  Lab 06/18/21 2044 06/19/21 0539  NA 138 138  K 4.0 3.2*  CL 103 110  CO2 26 22  GLUCOSE 153* 115*  BUN 25* 21  CREATININE  1.04 0.63  CALCIUM 9.3 7.3*   Liver Function Tests: Recent Labs  Lab 06/18/21 2044 06/19/21 0539  AST 28 21  ALT 25 19  ALKPHOS 43 39  BILITOT 1.0 0.8  PROT 7.1 5.3*  ALBUMIN 4.0 3.1*   CBG: Recent Labs  Lab 06/19/21 0031 06/19/21 0547 06/19/21 0819 06/19/21 1146  GLUCAP 168* 133* 156* 154*    Discharge time spent: 40 minutes.  Signed: Edwin Dada, MD [facility] 06/19/2021

## 2021-06-19 NOTE — ED Notes (Signed)
 Fee notified via secure chat that MRI is unable to perform MRI as patient does not have card for penile implant and does not know if it is MRI safe.

## 2021-06-19 NOTE — Hospital Course (Addendum)
Mr. Temme is a 81 y.o. M with HTN, NIDDM and hypothyroidism who presented with a syncopal episode.  Patient started new Jardiance about 2 weeks ago. On day of admission, he took his normal metoprolol, took the new Jardiance, and later, got up to walk from one room to the other, and passed out.    In the ER, ECG showed a sinus bradycardia, similar to previous.  He was orthostatic.  BMP unremarkable.  CBC normal, troponin normal, TSH slightly elevated.

## 2021-06-19 NOTE — Assessment & Plan Note (Addendum)
Patient had prominent orthostasis on BP checks (although asymptomatic, and ambulated without symptoms).  Echo unremarkable.  ECG unchanged from previous.  Cardiology recommended holding metoprolol and placing 30 day cardiac event monitor, and follow up with Cardiology in 1 month.  Given coincidence of new Jardiance and orthostasis and syncope, we have held: Jardiance Micardis Flomax  Resume Micardis in 1 week if BP increases.    Defer Jardiance restart to PCP.    Flomax reportedly not that helpful, can be stopped in that case.

## 2021-06-19 NOTE — Care Management CC44 (Signed)
Condition Code 44 Documentation Completed  Patient Details  Name: Brett [name] MRN: [id number] Date of Birth: 1941-01-21   Condition Code 44 given:  Yes Patient signature on Condition Code 44 notice:  Yes Documentation of 2 MD's agreement:  Yes Code 44 added to claim:  Yes    Anselm Pancoast, RN 06/19/2021, 12:54 PM

## 2021-06-19 NOTE — Progress Notes (Signed)
*  PRELIMINARY RESULTS* Echocardiogram 2D Echocardiogram has been performed.  Sherrie Sport 06/19/2021, 10:16 AM

## 2021-06-22 ENCOUNTER — Telehealth: Payer: Self-pay | Admitting: Internal Medicine

## 2021-06-22 NOTE — Telephone Encounter (Signed)
Patient requesting testing .  He was advised by someone that he needs to check his carotid and scan his legs.   Please advise.

## 2021-06-22 NOTE — Telephone Encounter (Signed)
Advised Brett [name] to keep the lab appt for now and let see if Dr. Olivia Mackie orders any labs at his appt on 06/24/2021, then we will decide if lab appt can be canceled or not. Brett [name] and Brett [name]'s wife gave a verbal understanding.

## 2021-06-22 NOTE — Telephone Encounter (Signed)
Schedule 2/14 fu visit with Gollan .

## 2021-06-22 NOTE — Telephone Encounter (Signed)
Was able to return call to Mr. Boylan, advised additional testing will be discuss at upcoming visit 2/14, for now focusing on Zio monitor d/t bradycardiac. Monitor has been register and to be mailed.  Pt verbalized understanding and thankful for the return call.

## 2021-06-22 NOTE — Telephone Encounter (Signed)
Pt wife called in requesting a Hosp F/U. Pt wife stated that he was admitted in the hosp last week. Pt stated that doctor at hosp requested Pt to follow up with pcp do to medication changes. Schedule Pt with dr. Olivia Mackie on 06/24/2021 @ 10am. Pt wife state that ED did a whole bunch of lab work. Pt wife was wondering if Pt still needs to have lab work done on 07/01/2021 @ 9:45am. Pt wife is requesting callback with update.

## 2021-06-24 ENCOUNTER — Ambulatory Visit (INDEPENDENT_AMBULATORY_CARE_PROVIDER_SITE_OTHER): Payer: PPO | Admitting: Internal Medicine

## 2021-06-24 ENCOUNTER — Other Ambulatory Visit: Payer: Self-pay

## 2021-06-24 ENCOUNTER — Encounter: Payer: Self-pay | Admitting: Internal Medicine

## 2021-06-24 VITALS — BP 108/60 | HR 53 | Temp 97.0°F | Ht 71.0 in | Wt 169.0 lb

## 2021-06-24 DIAGNOSIS — E039 Hypothyroidism, unspecified: Secondary | ICD-10-CM | POA: Diagnosis not present

## 2021-06-24 DIAGNOSIS — I152 Hypertension secondary to endocrine disorders: Secondary | ICD-10-CM

## 2021-06-24 DIAGNOSIS — D649 Anemia, unspecified: Secondary | ICD-10-CM

## 2021-06-24 DIAGNOSIS — R55 Syncope and collapse: Secondary | ICD-10-CM

## 2021-06-24 DIAGNOSIS — E1159 Type 2 diabetes mellitus with other circulatory complications: Secondary | ICD-10-CM | POA: Diagnosis not present

## 2021-06-24 DIAGNOSIS — R413 Other amnesia: Secondary | ICD-10-CM

## 2021-06-24 DIAGNOSIS — E876 Hypokalemia: Secondary | ICD-10-CM | POA: Diagnosis not present

## 2021-06-24 DIAGNOSIS — R001 Bradycardia, unspecified: Secondary | ICD-10-CM

## 2021-06-24 DIAGNOSIS — R319 Hematuria, unspecified: Secondary | ICD-10-CM | POA: Diagnosis not present

## 2021-06-24 MED ORDER — LEVOTHYROXINE SODIUM 100 MCG PO TABS: 100.0000 ug | ORAL_TABLET | Freq: Every day | ORAL | 3 refills | Status: DC

## 2021-06-24 MED ORDER — POTASSIUM CHLORIDE CRYS ER 20 MEQ PO TBCR: 20.0000 meq | EXTENDED_RELEASE_TABLET | Freq: Every day | ORAL | 0 refills | Status: DC

## 2021-06-24 NOTE — Progress Notes (Signed)
Chief Complaint  Patient presents with   Hospitalization Follow-up   ED visit f/u [facility] 06/18/21 with wife today for :  syncope/bradycardia but no fainting since ED . He actually stood up hit the back of his head and passed out  On arrival [facility] HR to the 48 normally in the 50s and sbp >160s and he is currently hypotensive medications were stopped Jardiance 10 mg (but he did not take this for 1.5 weeks leading up to passing out), metoprolol 12.5 mg stopped, flomax stopped and micardis 1/2 dose 20 mg =10 mg was stopped in the hospital    Wife states he has not been himself and had memory loss short term    Reviewed labs tsh was elevated and pt had been taking levothyroxine 75 mcg qd not 112 or 100 mg is the dose PCP rec last as 112 make TSH too low  Advised wife this could cause memory issues she wants to hold on neurology consult for now 07/09/20 09:55 TSH: 0.33 (L)  10/09/20 10:04 TSH: 0.28 (L)  12/01/20 09:54 TSH: 0.30 (L)  01/07/21 11:22 TSH: 3.13 Thyroglobulin: <0.1 (L) Thyroglobulin Ab: >1,000 (H) Thyroperoxidase Ab SerPl-aCnc: 284 (H)  06/18/21 20:44 TSH: 21.200 (H) T4,Free(Direct): 0.69   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  HENT:  Negative for hearing loss.   Eyes:  Negative for blurred vision.  Respiratory:  Negative for shortness of breath.   Gastrointestinal:  Negative for abdominal pain and blood in stool.  Musculoskeletal:  Negative for back pain.  Skin:  Negative for rash.  Neurological:  Negative for headaches.  Psychiatric/Behavioral:  Positive for memory loss. Negative for depression.   Past Medical History:  Diagnosis Date   Allergy    resolved after CABG   Calcific Achilles tendonitis    Calculus of ureter    Coronary artery disease    Diabetes mellitus    H/O renal calculi 2012   Hematuria, unspecified    Hyperlipidemia    Hypertension    Impotence of organic  origin    Myocardial infarction [facility])    Peyronie's disease    Pulmonary nodule seen on imaging study 2012   Sleep difficulties    Swelling, mass, or lump in chest    Unspecified hypothyroidism    Past Surgical History:  Procedure Laterality Date   CARDIAC CATHETERIZATION  2001   [facility] x3 stent   COLONOSCOPY     COLONOSCOPY WITH PROPOFOL N/A 01/24/2017   Procedure: COLONOSCOPY WITH PROPOFOL;  Surgeon: Manya Silvas, MD;  Location: [facility] ENDOSCOPY;  Service: Endoscopy;  Laterality: N/A;   CORONARY ARTERY BYPASS GRAFT  2006   4 vessel, after 3 or 4 stents    CORONARY ARTERY BYPASS GRAFT     CYSTOSCOPY W/ RETROGRADES  01/18/2020   Procedure: CYSTOSCOPY WITH RETROGRADE PYELOGRAM;  Surgeon: Billey Co, MD;  Location: [facility] ORS;  Service: Urology;;   CYSTOSCOPY/URETEROSCOPY/HOLMIUM LASER/STENT PLACEMENT Right 01/18/2020   Procedure: CYSTOSCOPY/URETEROSCOPY/HOLMIUM LASER/STENT PLACEMENT;  Surgeon: Billey Co, MD;  Location: [facility] ORS;  Service: Urology;  Laterality: Right;   OSTECTOMY CALCANEUS Left    PENILE PROSTHESIS IMPLANT     Family History  Problem Relation Age of Onset   Hyperlipidemia Mother    AAA (abdominal aortic aneurysm) Mother    Heart disease Father 44   Social History   Socioeconomic History   Marital status: Married  Spouse name: Audelia Acton now deceased   Number of children: Not on file   Years of education: Not on file   Highest education level: Not on file  Occupational History    Employer: retired  Tobacco Use   Smoking status: Former    Types: Cigars    Quit date: 10/04/1981    Years since quitting: 39.7   Smokeless tobacco: Never  Vaping Use   Vaping Use: Never used  Substance and Sexual Activity   Alcohol use: Yes    Alcohol/week: 1.0 - 2.0 standard drink    Types: 1 - 2 Cans of beer per week    Comment: everyday   Drug use: No   Sexual activity: Not Currently  Other Topics Concern   Not on file  Social History Narrative    Not on file   Social Determinants of Health   Financial Resource Strain: Low Risk    Difficulty of Paying Living Expenses: Not hard at all  Food Insecurity: No Food Insecurity   Worried About Charity fundraiser in the Last Year: Never true   [place] in the Last Year: Never true  Transportation Needs: No Transportation Needs   Lack of Transportation (Medical): No   Lack of Transportation (Non-Medical): No  Physical Activity: Sufficiently Active   Days of Exercise per Week: 3 days   Minutes of Exercise per Session: 50 min  Stress: No Stress Concern Present   Feeling of Stress : Not at all  Social Connections: Unknown   Frequency of Communication with Friends and Family: Not on file   Frequency of Social Gatherings with Friends and Family: Not on file   Attends Religious Services: Not on file   Active Member of Clubs or Organizations: Not on file   Attends Archivist Meetings: Not on file   Marital Status: Married  Human resources officer Violence: Not At Risk   Fear of Current or Ex-Partner: No   Emotionally Abused: No   Physically Abused: No   Sexually Abused: No   Current Meds  Medication Sig   acetaminophen (TYLENOL) 500 MG tablet Take 1-2 tablets (500-1,000 mg total) by mouth in the morning, at noon, and at bedtime.   aspirin EC 81 MG tablet Take 81 mg by mouth daily.   atorvastatin (LIPITOR) 80 MG tablet Take 1 tablet (80 mg total) by mouth daily.   Cholecalciferol (VITAMIN D) 50 MCG (2000 UT) tablet Take 2,000 Units by mouth daily.   DULoxetine (CYMBALTA) 30 MG capsule TAKE 1 CAPSULE BY MOUTH EVERY DAY   empagliflozin (JARDIANCE) 10 MG TABS tablet Take by mouth daily.   ezetimibe (ZETIA) 10 MG tablet Take 1 tablet (10 mg total) by mouth daily.   FREESTYLE LITE test strip CHECK BLOOD SUGAR DAILY AS DIRECTED   glucose monitoring kit (FREESTYLE) monitoring kit 1 each by Does not apply route as needed for other. For use daily to monitor diabetes.  Please include  lancets .  Test once daily   E11.9   Lancets (FREESTYLE) lancets Use as instructed   potassium chloride SA (KLOR-CON M) 20 MEQ tablet Take 1 tablet (20 mEq total) by mouth daily. 5 days   [DISCONTINUED] levothyroxine (SYNTHROID) 75 MCG tablet TAKE 1 TABLET BY MOUTH EVERY DAY   Allergies  Allergen Reactions   Metformin And Related     Low appetite     Simvastatin Other (See Comments)   Eszopiclone Rash   Recent Results (from the past 2160 hour(s))  CBC     Status: None   Collection Time: 06/18/21  8:44 PM  Result Value Ref Range   WBC 7.2 4.0 - 10.5 K/uL   RBC 4.34 4.22 - 5.81 MIL/uL   Hemoglobin 13.2 13.0 - 17.0 g/dL   HCT 40.9 39.0 - 52.0 %   MCV 94.2 80.0 - 100.0 fL   MCH 30.4 26.0 - 34.0 pg   MCHC 32.3 30.0 - 36.0 g/dL   RDW 13.3 11.5 - 15.5 %   Platelets 187 150 - 400 K/uL   nRBC 0.0 0.0 - 0.2 %    Comment: Performed at [facility], [address] [place], [place] [address]  Comprehensive metabolic panel     Status: Abnormal   Collection Time: 06/18/21  8:44 PM  Result Value Ref Range   Sodium 138 135 - 145 mmol/L   Potassium 4.0 3.5 - 5.1 mmol/L   Chloride 103 98 - 111 mmol/L   CO2 26 22 - 32 mmol/L   Glucose, Bld 153 (H) 70 - 99 mg/dL    Comment: Glucose reference range applies only to samples taken after fasting for at least 8 hours.   BUN 25 (H) 8 - 23 mg/dL   Creatinine, Ser 1.04 0.61 - 1.24 mg/dL   Calcium 9.3 8.9 - 10.3 mg/dL   Total Protein 7.1 6.5 - 8.1 g/dL   Albumin 4.0 3.5 - 5.0 g/dL   AST 28 15 - 41 U/L   ALT 25 0 - 44 U/L   Alkaline Phosphatase 43 38 - 126 U/L   Total Bilirubin 1.0 0.3 - 1.2 mg/dL   GFR, Estimated >60 >60 mL/min    Comment: (NOTE) Calculated using the CKD-EPI Creatinine Equation (2021)    Anion gap 9 5 - 15    Comment: Performed at [facility], [place], [place] [address]  Troponin I (High Sensitivity)     Status: None   Collection Time: 06/18/21  8:44 PM  Result Value Ref Range   Troponin  I (High Sensitivity) 3 <18 ng/L    Comment: (NOTE) Elevated high sensitivity troponin I (hsTnI) values and significant  changes across serial measurements may suggest ACS but many other  chronic and acute conditions are known to elevate hsTnI results.  Refer to the "Links" section for chest pain algorithms and additional  guidance. Performed at [facility], [address] [place], [place] [address]   TSH     Status: Abnormal   Collection Time: 06/18/21  8:44 PM  Result Value Ref Range   TSH 21.200 (H) 0.350 - 4.500 uIU/mL    Comment: Performed by a 3rd Generation assay with a functional sensitivity of <=0.01 uIU/mL. Performed at [facility], [address] [place], [place] [address]   T4, free     Status: None   Collection Time: 06/18/21  8:44 PM  Result Value Ref Range   Free T4 0.69 0.61 - 1.12 ng/dL    Comment: (NOTE) Biotin ingestion may interfere with free T4 tests. If the results are inconsistent with the TSH level, previous test results, or the clinical presentation, then consider biotin interference. If needed, order repeat testing after stopping biotin. Performed at [facility], [address] [place], [place] [address]   Resp Panel by RT-PCR (Flu A&B, Covid) Nasopharyngeal Swab     Status: None   Collection Time: 06/18/21  9:18 PM   Specimen: Nasopharyngeal Swab; Nasopharyngeal(NP) swabs in vial transport medium  Result  Value Ref Range   SARS Coronavirus 2 by RT PCR NEGATIVE NEGATIVE    Comment: (NOTE) SARS-CoV-2 target nucleic acids are NOT DETECTED.  The SARS-CoV-2 RNA is generally detectable in upper respiratory specimens during the acute phase of infection. The lowest concentration of SARS-CoV-2 viral copies this assay can detect is 138 copies/mL. A negative result does not preclude SARS-Cov-2 infection and should not be used as the sole basis for treatment or other patient management decisions. A negative result may occur  with  improper specimen collection/handling, submission of specimen other than nasopharyngeal swab, presence of viral mutation(s) within the areas targeted by this assay, and inadequate number of viral copies(<138 copies/mL). A negative result must be combined with clinical observations, patient history, and epidemiological information. The expected result is Negative.  Fact Sheet for Patients:  EntrepreneurPulse.com.au  Fact Sheet for Healthcare Providers:  IncredibleEmployment.be  This test is no t yet approved or cleared by the [place] FDA and  has been authorized for detection and/or diagnosis of SARS-CoV-2 by FDA under an Emergency Use Authorization (EUA). This EUA will remain  in effect (meaning this test can be used) for the duration of the COVID-19 declaration under Section 564(b)(1) of the Act, 21 U.S.C.section 360bbb-3(b)(1), unless the authorization is terminated  or revoked sooner.       Influenza A by PCR NEGATIVE NEGATIVE   Influenza B by PCR NEGATIVE NEGATIVE    Comment: (NOTE) The Xpert Xpress SARS-CoV-2/FLU/RSV plus assay is intended as an aid in the diagnosis of influenza from Nasopharyngeal swab specimens and should not be used as a sole basis for treatment. Nasal washings and aspirates are unacceptable for Xpert Xpress SARS-CoV-2/FLU/RSV testing.  Fact Sheet for Patients: EntrepreneurPulse.com.au  Fact Sheet for Healthcare Providers: IncredibleEmployment.be  This test is not yet approved or cleared by the [place] FDA and has been authorized for detection and/or diagnosis of SARS-CoV-2 by FDA under an Emergency Use Authorization (EUA). This EUA will remain in effect (meaning this test can be used) for the duration of the COVID-19 declaration under Section 564(b)(1) of the Act, 21 U.S.C. section 360bbb-3(b)(1), unless the authorization is terminated or revoked.  Performed  at [facility], [address] [place], [place] [address]   CBG monitoring, ED     Status: Abnormal   Collection Time: 06/19/21 12:31 AM  Result Value Ref Range   Glucose-Capillary 168 (H) 70 - 99 mg/dL    Comment: Glucose reference range applies only to samples taken after fasting for at least 8 hours.  Troponin I (High Sensitivity)     Status: None   Collection Time: 06/19/21 12:34 AM  Result Value Ref Range   Troponin I (High Sensitivity) <2 <18 ng/L    Comment: (NOTE) Elevated high sensitivity troponin I (hsTnI) values and significant  changes across serial measurements may suggest ACS but many other  chronic and acute conditions are known to elevate hsTnI results.  Refer to the "Links" section for chest pain algorithms and additional  guidance. Performed at [facility], [address] [place], [place] [address]   Procalcitonin - Baseline     Status: None   Collection Time: 06/19/21 12:34 AM  Result Value Ref Range   Procalcitonin <0.10 ng/mL    Comment:        Interpretation: PCT (Procalcitonin) <= 0.5 ng/mL: Systemic infection (sepsis) is not likely. Local bacterial infection is possible. (NOTE)       Sepsis PCT Algorithm  Lower Respiratory Tract                                      Infection PCT Algorithm    ----------------------------     ----------------------------         PCT < 0.25 ng/mL                PCT < 0.10 ng/mL          Strongly encourage             Strongly discourage   discontinuation of antibiotics    initiation of antibiotics    ----------------------------     -----------------------------       PCT 0.25 - 0.50 ng/mL            PCT 0.10 - 0.25 ng/mL               OR       >80% decrease in PCT            Discourage initiation of                                            antibiotics      Encourage discontinuation           of antibiotics    ----------------------------     -----------------------------          PCT >= 0.50 ng/mL              PCT 0.26 - 0.50 ng/mL               AND        <80% decrease in PCT             Encourage initiation of                                             antibiotics       Encourage continuation           of antibiotics    ----------------------------     -----------------------------        PCT >= 0.50 ng/mL                  PCT > 0.50 ng/mL               AND         increase in PCT                  Strongly encourage                                      initiation of antibiotics    Strongly encourage escalation           of antibiotics                                     -----------------------------  PCT <= 0.25 ng/mL                                                 OR                                        > 80% decrease in PCT                                      Discontinue / Do not initiate                                             antibiotics  Performed at [facility], [address] [place], [place] [address]   Comprehensive metabolic panel     Status: Abnormal   Collection Time: 06/19/21  5:39 AM  Result Value Ref Range   Sodium 138 135 - 145 mmol/L   Potassium 3.2 (L) 3.5 - 5.1 mmol/L   Chloride 110 98 - 111 mmol/L   CO2 22 22 - 32 mmol/L   Glucose, Bld 115 (H) 70 - 99 mg/dL    Comment: Glucose reference range applies only to samples taken after fasting for at least 8 hours.   BUN 21 8 - 23 mg/dL   Creatinine, Ser 0.63 0.61 - 1.24 mg/dL   Calcium 7.3 (L) 8.9 - 10.3 mg/dL   Total Protein 5.3 (L) 6.5 - 8.1 g/dL   Albumin 3.1 (L) 3.5 - 5.0 g/dL   AST 21 15 - 41 U/L   ALT 19 0 - 44 U/L   Alkaline Phosphatase 39 38 - 126 U/L   Total Bilirubin 0.8 0.3 - 1.2 mg/dL   GFR, Estimated >60 >60 mL/min    Comment: (NOTE) Calculated using the CKD-EPI Creatinine Equation (2021)    Anion gap 6 5 - 15    Comment: Performed at [facility], [address] [place], [place] [address]  CBC      Status: Abnormal   Collection Time: 06/19/21  5:39 AM  Result Value Ref Range   WBC 9.1 4.0 - 10.5 K/uL   RBC 3.83 (L) 4.22 - 5.81 MIL/uL   Hemoglobin 11.9 (L) 13.0 - 17.0 g/dL   HCT 36.3 (L) 39.0 - 52.0 %   MCV 94.8 80.0 - 100.0 fL   MCH 31.1 26.0 - 34.0 pg   MCHC 32.8 30.0 - 36.0 g/dL   RDW 13.3 11.5 - 15.5 %   Platelets 161 150 - 400 K/uL   nRBC 0.0 0.0 - 0.2 %    Comment: Performed at [facility], [address] [place],  [address]  Hemoglobin A1c     Status: Abnormal   Collection Time: 06/19/21  5:39 AM  Result Value Ref Range   Hgb A1c MFr Bld 7.2 (H) 4.8 - 5.6 %    Comment: (NOTE) Pre diabetes:          5.7%-6.4%  Diabetes:              >6.4%  Glycemic control for   <  7.0% adults with diabetes    Mean Plasma Glucose 159.94 mg/dL    Comment: Performed at [facility], [place] [address], [place], [place] [address]  CBG monitoring, ED     Status: Abnormal   Collection Time: 06/19/21  5:47 AM  Result Value Ref Range   Glucose-Capillary 133 (H) 70 - 99 mg/dL    Comment: Glucose reference range applies only to samples taken after fasting for at least 8 hours.  CBG monitoring, ED     Status: Abnormal   Collection Time: 06/19/21  8:19 AM  Result Value Ref Range   Glucose-Capillary 156 (H) 70 - 99 mg/dL    Comment: Glucose reference range applies only to samples taken after fasting for at least 8 hours.  ECHOCARDIOGRAM COMPLETE     Status: None   Collection Time: 06/19/21 10:16 AM  Result Value Ref Range   Weight 2,880 oz   Height 71 in   BP 112/69 mmHg   Ao pk vel 1.05 m/s   AV Area VTI 2.24 cm2   AR max vel 2.13 cm2   AV Mean grad 2.0 mmHg   AV Peak grad 4.4 mmHg   S' Lateral 3.00 cm   AV Area mean vel 2.00 cm2   Area-P 1/2 1.84 cm2   MV VTI 1.61 cm2  CBG monitoring, ED     Status: Abnormal   Collection Time: 06/19/21 11:46 AM  Result Value Ref Range   Glucose-Capillary 154 (H) 70 - 99 mg/dL    Comment: Glucose reference range applies  only to samples taken after fasting for at least 8 hours.   Comment 1 Notify RN    Comment 2 Document in Chart    Objective  Body mass index is 23.57 kg/m. Wt Readings from Last 3 Encounters:  06/24/21 169 lb (76.7 kg)  06/18/21 180 lb (81.6 kg)  05/26/21 172 lb (78 kg)   Temp Readings from Last 3 Encounters:  06/24/21 (!) 97 F (36.1 C) (Temporal)  06/18/21 98.3 F (36.8 C) (Oral)  10/09/20 (!) 96.6 F (35.9 C) (Temporal)   BP Readings from Last 3 Encounters:  06/24/21 108/60  06/19/21 132/78  10/09/20 (!) 110/58   Pulse Readings from Last 3 Encounters:  06/24/21 (!) 53  06/19/21 (!) 51  10/09/20 64    Physical Exam Vitals and nursing note reviewed.  Constitutional:      Appearance: Normal appearance. He is well-developed and well-groomed.  HENT:     Head: Normocephalic and atraumatic.  Eyes:     Conjunctiva/sclera: Conjunctivae normal.     Pupils: Pupils are equal, round, and reactive to light.  Cardiovascular:     Rate and Rhythm: Normal rate and regular rhythm.     Heart sounds: Normal heart sounds.  Pulmonary:     Effort: Pulmonary effort is normal. No respiratory distress.     Breath sounds: Normal breath sounds.  Abdominal:     Tenderness: There is no abdominal tenderness.  Musculoskeletal:     Lumbar back: Tenderness present. Negative right straight leg raise test and negative left straight leg raise test.  Skin:    General: Skin is warm and moist.  Neurological:     General: No focal deficit present.     Mental Status: He is alert and oriented to person, place, and time. Mental status is at baseline.     Sensory: Sensation is intact.     Motor: Motor function is intact.     Coordination: Coordination  is intact.     Gait: Gait is intact. Gait normal.  Psychiatric:        Attention and Perception: Attention and perception normal.        Mood and Affect: Mood and affect normal.        Speech: Speech normal.        Behavior: Behavior normal.  Behavior is cooperative.        Thought Content: Thought content normal.        Cognition and Memory: Cognition and memory normal.        Judgment: Judgment normal.    Assessment  Plan  Syncope, unspecified syncope type Bradycardia  Has f/u with cardiology [place] 07/21/21 and PCP again 07/09/21  Medications held jardiance 10 mg per pt he is drinking more water  Metoprolol 12.5 mg continue to hold  Continue to hold flomax  Continue to hold micardis 1/2 of 20 mg qd Ok to resume jardiance 10 mg for dm 2   Memory loss could be due to uncontrolled thyroid pt was taking 75 mcg qd not 100 per last note  Hold on neurology consult for now per pt and wife request and will monitor but consider   Hypothyroidism, unspecified type - Plan: levothyroxine (SYNTHROID) 100 MCG tablet, TSH  Latest Reference Range & Units 06/18/21 20:44  TSH 0.350 - 4.500 uIU/mL 21.200 (H)  (H): Data is abnormally high  Repeat TSH in 6-8 weeks   Hypokalemia - Plan: potassium chloride SA (KLOR-CON M) 20 MEQ tablet, Comprehensive metabolic panel  Hypertension associated with diabetes ([facility]) - Plan: Comprehensive metabolic panel, Lipid panel, Microalbumin / creatinine urine ratio Resume jardiance 10 mg qd  Hold micardis 10 mg qd due to syncope and hypotensive currently   Anemia, unspecified type - Plan: CBC with Differential/Platelet Iron   Hematuria, unspecified type - Plan: Urine Culture  HM  F/u with PCP  Provider: Dr. Olivia Mackie [facility]

## 2021-06-24 NOTE — Patient Instructions (Addendum)
Consider Neurology consult in the future for short term memory loss  Consider premier shake over the counter Multivitamin centrum men or nature made     Hypothyroidism Hypothyroidism is when the thyroid gland does not make enough of certain hormones (it is underactive). The thyroid gland is a small gland located in the lower front part of the neck, just in front of the windpipe (trachea). This gland makes hormones that help control how the body uses food for energy (metabolism) as well as how the heart and brain function. These hormones also play a role in keeping your bones strong. When the thyroid is underactive, it produces too little of the hormones thyroxine (T4) and triiodothyronine (T3). What are the causes? This condition may be caused by: Hashimoto's disease. This is a disease in which the body's disease-fighting system (immune system) attacks the thyroid gland. This is the most common cause. Viral infections. Pregnancy. Certain medicines. Birth defects. Past radiation treatments to the head or neck for cancer. Past treatment with radioactive iodine. Past exposure to radiation in the environment. Past surgical removal of part or all of the thyroid. Problems with a gland in the center of the brain (pituitary gland). Lack of enough iodine in the diet. What increases the risk? You are more likely to develop this condition if: You are male. You have a family history of thyroid conditions. You use a medicine called lithium. You take medicines that affect the immune system (immunosuppressants). What are the signs or symptoms? Symptoms of this condition include: Feeling as though you have no energy (lethargy). Not being able to tolerate cold. Weight gain that is not explained by a change in diet or exercise habits. Lack of appetite. Dry skin. Coarse hair. Menstrual irregularity. Slowing of thought processes. Constipation. Sadness or depression. How is this diagnosed? This  condition may be diagnosed based on: Your symptoms, your medical history, and a physical exam. Blood tests. You may also have imaging tests, such as an ultrasound or MRI. How is this treated? This condition is treated with medicine that replaces the thyroid hormones that your body does not make. After you begin treatment, it may take several weeks for symptoms to go away. Follow these instructions at home: Take over-the-counter and prescription medicines only as told by your health care provider. If you start taking any new medicines, tell your health care provider. Keep all follow-up visits as told by your health care provider. This is important. As your condition improves, your dosage of thyroid hormone medicine may change. You will need to have blood tests regularly so that your health care provider can monitor your condition. Contact a health care provider if: Your symptoms do not get better with treatment. You are taking thyroid hormone replacement medicine and you: Sweat a lot. Have tremors. Feel anxious. Lose weight rapidly. Cannot tolerate heat. Have emotional swings. Have diarrhea. Feel weak. Get help right away if you have: Chest pain. An irregular heartbeat. A rapid heartbeat. Difficulty breathing. Summary Hypothyroidism is when the thyroid gland does not make enough of certain hormones (it is underactive). When the thyroid is underactive, it produces too little of the hormones thyroxine (T4) and triiodothyronine (T3). The most common cause is Hashimoto's disease, a disease in which the body's disease-fighting system (immune system) attacks the thyroid gland. The condition can also be caused by viral infections, medicine, pregnancy, or past radiation treatment to the head or neck. Symptoms may include weight gain, dry skin, constipation, feeling as though you do not  have energy, and not being able to tolerate cold. This condition is treated with medicine to replace the  thyroid hormones that your body does not make. This information is not intended to replace advice given to you by your health care provider. Make sure you discuss any questions you have with your health care provider. Document Revised: 01/29/2021 Document Reviewed: 02/07/2020 Elsevier Patient Education  2022 [place].   Management of Memory Problems  There are some general things you can do to help manage your memory problems.  Your memory may not in fact recover, but by using techniques and strategies you will be able to manage your memory difficulties better.  1)  Establish a routine. Try to establish and then stick to a regular routine.  By doing this, you will get used to what to expect and you will reduce the need to rely on your memory.  Also, try to do things at the same time of day, such as taking your medication or checking your calendar first thing in the morning. Think about think that you can do as a part of a regular routine and make a list.  Then enter them into a daily planner to remind you.  This will help you establish a routine.  2)  Organize your environment. Organize your environment so that it is uncluttered.  Decrease visual stimulation.  Place everyday items such as keys or cell phone in the same place every day (ie.  Basket next to front door) Use post it notes with a brief message to yourself (ie. Turn off light, lock the door) Use labels to indicate where things go (ie. Which cupboards are for food, dishes, etc.) Keep a notepad and pen by the telephone to take messages  3)  Memory Aids A diary or journal/notebook/daily planner Making a list (shopping list, chore list, to do list that needs to be done) Using an alarm as a reminder (kitchen timer or cell phone alarm) Using cell phone to store information (Notes, Calendar, Reminders) Calendar/White board placed in a prominent position Post-it notes  In order for memory aids to be useful, you need to have good  habits.  It's no good remembering to make a note in your journal if you don't remember to look in it.  Try setting aside a certain time of day to look in journal.  4)  Improving mood and managing fatigue. There may be other factors that contribute to memory difficulties.  Factors, such as anxiety, depression and tiredness can affect memory. Regular gentle exercise can help improve your mood and give you more energy. Simple relaxation techniques may help relieve symptoms of anxiety Try to get back to completing activities or hobbies you enjoyed doing in the past. Learn to pace yourself through activities to decrease fatigue. Find out about some local support groups where you can share experiences with others. Try and achieve 7-8 hours of sleep at night. Memory Compensation Strategies  Use "WARM" strategy.  W= write it down  A= associate it  R= repeat it  M= make a mental note  2.   You can keep a Social worker.  Use a 3-ring notebook with sections for the following: calendar, important names and phone numbers,  medications, doctors' names/phone numbers, lists/reminders, and a section to journal what you did  each day.   3.    Use a calendar to write appointments down.  4.    Write yourself a schedule for the day.  This can be  placed on the calendar or in a separate section of the Memory Notebook.  Keeping a  regular schedule can help memory.  5.    Use medication organizer with sections for each day or morning/evening pills.  You may need help loading it  6.    Keep a basket, or pegboard by the door.  Place items that you need to take out with you in the basket or on the pegboard.  You may also want to  include a message board for reminders.  7.    Use sticky notes.  Place sticky notes with reminders in a place where the task is performed.  For example: " turn off the  stove" placed by the stove, "lock the door" placed on the door at eye level, " take your medications" on  the  bathroom mirror or by the place where you normally take your medications.  8.    Use alarms/timers.  Use while cooking to remind yourself to check on food or as a reminder to take your medicine, or as a  reminder to make a call, or as a reminder to perform another task, etc.

## 2021-06-25 ENCOUNTER — Telehealth: Payer: Self-pay | Admitting: Internal Medicine

## 2021-06-25 DIAGNOSIS — R55 Syncope and collapse: Secondary | ICD-10-CM | POA: Diagnosis not present

## 2021-06-25 NOTE — Telephone Encounter (Signed)
Call pt and changed my mind want fasting labs and urine prior to 07/09/21 PCP appt at 11:30 want labs before cards appt an durine   All labs done including iron all labs but no not do TSH until 6-8 weeks from 06/24/21 will need lab appt to f/u thyroid     Sch this too

## 2021-06-25 NOTE — Telephone Encounter (Signed)
Call pt see if can do fasting labs and urine 07/09/21 before appt with PCP at 11:30 ive changed my mind about these want labs redone before sees cardiology   BUT do not release TSH until 6-8 weeks or do this 6-8 from 06/24/21 he will need another lab visit for this

## 2021-06-26 DIAGNOSIS — R55 Syncope and collapse: Secondary | ICD-10-CM | POA: Diagnosis not present

## 2021-06-26 NOTE — Telephone Encounter (Signed)
Added message to other 06/25/21 telephone encounter

## 2021-06-26 NOTE — Telephone Encounter (Signed)
Left message to return call.  If Patient calling back in Dr Olivia Mackie McLean-Scocuzza has changed her mind and does want the Patient to have labs before his 07/09/21 appointment with Dr Derrel Nip.   Please schedule Patient for a lab appointment with this is the appointment notes "Do not release TSH, do all other labs"

## 2021-06-26 NOTE — Telephone Encounter (Signed)
Call pt and changed my mind want fasting labs and urine prior to 07/09/21 PCP appt at 11:30 want labs before cards appt an durine    All labs done including iron all labs but no not do TSH until 6-8 weeks from 06/24/21 will need lab appt to f/u thyroid        Sch this too

## 2021-06-29 ENCOUNTER — Other Ambulatory Visit: Payer: Self-pay

## 2021-06-29 ENCOUNTER — Other Ambulatory Visit (INDEPENDENT_AMBULATORY_CARE_PROVIDER_SITE_OTHER): Payer: PPO

## 2021-06-29 DIAGNOSIS — D649 Anemia, unspecified: Secondary | ICD-10-CM | POA: Diagnosis not present

## 2021-06-29 DIAGNOSIS — E1159 Type 2 diabetes mellitus with other circulatory complications: Secondary | ICD-10-CM

## 2021-06-29 DIAGNOSIS — R319 Hematuria, unspecified: Secondary | ICD-10-CM | POA: Diagnosis not present

## 2021-06-29 DIAGNOSIS — I152 Hypertension secondary to endocrine disorders: Secondary | ICD-10-CM

## 2021-06-29 DIAGNOSIS — E876 Hypokalemia: Secondary | ICD-10-CM

## 2021-06-29 DIAGNOSIS — E039 Hypothyroidism, unspecified: Secondary | ICD-10-CM

## 2021-06-29 LAB — COMPREHENSIVE METABOLIC PANEL
ALT: 24 U/L (ref 0–53)
AST: 24 U/L (ref 0–37)
Albumin: 4.4 g/dL (ref 3.5–5.2)
Alkaline Phosphatase: 53 U/L (ref 39–117)
BUN: 20 mg/dL (ref 6–23)
CO2: 29 mEq/L (ref 19–32)
Calcium: 9.6 mg/dL (ref 8.4–10.5)
Chloride: 106 mEq/L (ref 96–112)
Creatinine, Ser: 0.97 mg/dL (ref 0.40–1.50)
GFR: 74.9 mL/min (ref >60.00)
Glucose, Bld: 97 mg/dL (ref 70–99)
Potassium: 4.2 mEq/L (ref 3.5–5.1)
Sodium: 143 mEq/L (ref 135–145)
Total Bilirubin: 0.7 mg/dL (ref 0.2–1.2)
Total Protein: 7.1 g/dL (ref 6.0–8.3)

## 2021-06-29 LAB — CBC WITH DIFFERENTIAL/PLATELET
Basophils Absolute: 0 10*3/uL (ref 0.0–0.1)
Basophils Relative: 0.5 % (ref 0.0–3.0)
Eosinophils Absolute: 0.6 10*3/uL (ref 0.0–0.7)
Eosinophils Relative: 8.6 % — ABNORMAL HIGH (ref 0.0–5.0)
HCT: 41.1 % (ref 39.0–52.0)
Hemoglobin: 13.3 g/dL (ref 13.0–17.0)
Lymphocytes Relative: 30.3 % (ref 12.0–46.0)
Lymphs Abs: 2.1 10*3/uL (ref 0.7–4.0)
MCHC: 32.3 g/dL (ref 30.0–36.0)
MCV: 93.7 fl (ref 78.0–100.0)
Monocytes Absolute: 0.7 10*3/uL (ref 0.1–1.0)
Monocytes Relative: 10.5 % (ref 3.0–12.0)
Neutro Abs: 3.4 10*3/uL (ref 1.4–7.7)
Neutrophils Relative %: 50.1 % (ref 43.0–77.0)
Platelets: 205 10*3/uL (ref 150.0–400.0)
RBC: 4.39 Mil/uL (ref 4.22–5.81)
RDW: 14.4 % (ref 11.5–15.5)
WBC: 6.8 10*3/uL (ref 4.0–10.5)

## 2021-06-29 LAB — LIPID PANEL
Cholesterol: 118 mg/dL (ref 0–200)
HDL: 52.8 mg/dL (ref >39.00)
LDL Cholesterol: 41 mg/dL (ref 0–99)
NonHDL: 65.31
Total CHOL/HDL Ratio: 2
Triglycerides: 121 mg/dL (ref 0.0–149.0)
VLDL: 24.2 mg/dL (ref 0.0–40.0)

## 2021-06-29 LAB — IBC + FERRITIN
Ferritin: 102.9 ng/mL (ref 22.0–322.0)
Iron: 96 ug/dL (ref 42–165)
Saturation Ratios: 31.7 % (ref 20.0–50.0)
TIBC: 302.4 ug/dL (ref 250.0–450.0)
Transferrin: 216 mg/dL (ref 212.0–360.0)

## 2021-06-29 LAB — TSH: TSH: 8.83 u[IU]/mL — ABNORMAL HIGH (ref 0.35–5.50)

## 2021-06-30 ENCOUNTER — Telehealth: Payer: PPO

## 2021-06-30 LAB — MICROALBUMIN / CREATININE URINE RATIO
Creatinine, Urine: 86 mg/dL (ref 20–320)
Microalb Creat Ratio: 9 mcg/mg creat (ref <30)
Microalb, Ur: 0.8 mg/dL

## 2021-06-30 LAB — URINE CULTURE
MICRO NUMBER:: 12905542
SPECIMEN QUALITY:: ADEQUATE

## 2021-07-01 ENCOUNTER — Other Ambulatory Visit: Payer: PPO

## 2021-07-03 NOTE — Telephone Encounter (Signed)
Lab completed.

## 2021-07-09 ENCOUNTER — Encounter: Payer: Self-pay | Admitting: Internal Medicine

## 2021-07-09 ENCOUNTER — Other Ambulatory Visit: Payer: Self-pay

## 2021-07-09 ENCOUNTER — Ambulatory Visit (INDEPENDENT_AMBULATORY_CARE_PROVIDER_SITE_OTHER): Payer: PPO | Admitting: Internal Medicine

## 2021-07-09 VITALS — BP 144/66 | HR 52 | Temp 97.5°F | Ht 71.0 in | Wt 173.8 lb

## 2021-07-09 DIAGNOSIS — E034 Atrophy of thyroid (acquired): Secondary | ICD-10-CM | POA: Diagnosis not present

## 2021-07-09 DIAGNOSIS — I700 Atherosclerosis of aorta: Secondary | ICD-10-CM | POA: Diagnosis not present

## 2021-07-09 DIAGNOSIS — E1159 Type 2 diabetes mellitus with other circulatory complications: Secondary | ICD-10-CM

## 2021-07-09 DIAGNOSIS — I152 Hypertension secondary to endocrine disorders: Secondary | ICD-10-CM

## 2021-07-09 MED ORDER — EMPAGLIFLOZIN 25 MG PO TABS: 25.0000 mg | ORAL_TABLET | Freq: Every day | ORAL | 1 refills | Status: DC

## 2021-07-09 NOTE — Assessment & Plan Note (Signed)
Correct administration of Medication restarted in  mid January ,  At previus dose of 75 mcg .  Repeat tsh due early march (he never saw endocrinolgy last summer)

## 2021-07-09 NOTE — Progress Notes (Signed)
Subjective:  Patient ID: Brett [name] Seen, male    DOB: [birth year]  Age: 81 y.o. MRN: [id number]  CC: The primary encounter diagnosis was Hypothyroidism due to acquired atrophy of thyroid. Diagnoses of Hypertension associated with type 2 diabetes mellitus ([place]) and Aortic atherosclerosis ([place]) were also pertinent to this visit.   This visit occurred during the SARS-CoV-2 public health emergency.  Safety protocols were in place, including screening questions prior to the visit, additional usage of staff PPE, and extensive cleaning of exam room while observing appropriate contact time as indicated for disinfecting solutions.    HPI Brett [name] presents for follow up on type 2 DM. Aortic atherosclerosis, CAD Chief Complaint  Patient presents with   Follow-up    6 month follow up    1) T2DM: reviewed recent a1c of 7.2  .H e feels generally well, is exercising several times per week and not checking blood sugars.  BS have been under 130 fasting and < 150 post prandially.  Denies any recent hypoglyemic events.  Taking his medications as directed. Following a carbohydrate modified diet 6 days per week. Denies numbness, burning and tingling of extremities. Appetite is improved since weight loss stopped after switching from [place] to [employer].      2) aortic atheroslcerosis:  Reviewed findings of prior CT scan today..  Patient is tolerating high potency statin therapy    3) Saw Dr Aundra Dubin after ER visit.  TSH was high.  Patient had been taking  levothyroxine (75 mcg)  and last level in August was normal on 75 mcg dose.  Had been hyperthyroid prior to that, and was referred to Endocrinology  but did have the appt.  Has been taking 39 MCG SINCE MID JANUARY,    4) cognitive changes improving since Invokkana was stopped and thyroid correction commencement. (Per wife)   Outpatient Medications Prior to Visit  Medication Sig Dispense Refill   acetaminophen (TYLENOL) 500 MG tablet Take 1-2 tablets (500-1,000  mg total) by mouth in the morning, at noon, and at bedtime.     aspirin EC 81 MG tablet Take 81 mg by mouth daily.     atorvastatin (LIPITOR) 80 MG tablet Take 1 tablet (80 mg total) by mouth daily. 90 tablet 3   Cholecalciferol (VITAMIN D) 50 MCG (2000 UT) tablet Take 2,000 Units by mouth daily.     DULoxetine (CYMBALTA) 30 MG capsule TAKE 1 CAPSULE BY MOUTH EVERY DAY 90 capsule 1   ezetimibe (ZETIA) 10 MG tablet Take 1 tablet (10 mg total) by mouth daily. 90 tablet 3   FREESTYLE LITE test strip CHECK BLOOD SUGAR DAILY AS DIRECTED 100 strip 5   glucose monitoring kit (FREESTYLE) monitoring kit 1 each by Does not apply route as needed for other. For use daily to monitor diabetes.  Please include lancets .  Test once daily   E11.9 1 each 3   Lancets (FREESTYLE) lancets Use as instructed 100 each 12   levothyroxine (SYNTHROID) 100 MCG tablet Take 1 tablet (100 mcg total) by mouth daily. 30 minutes before food in am 90 tablet 3   Multiple Vitamin (MULTIVITAMIN) tablet Take 1 tablet by mouth daily.     empagliflozin (JARDIANCE) 10 MG TABS tablet Take by mouth daily.     potassium chloride SA (KLOR-CON M) 20 MEQ tablet Take 1 tablet (20 mEq total) by mouth daily. 5 days (Patient not taking: Reported on 07/09/2021) 5 tablet 0   No facility-administered medications prior to visit.  Review of Systems;  Patient denies headache, fevers, malaise, unintentional weight loss, skin rash, eye pain, sinus congestion and sinus pain, sore throat, dysphagia,  hemoptysis , cough, dyspnea, wheezing, chest pain, palpitations, orthopnea, edema, abdominal pain, nausea, melena, diarrhea, constipation, flank pain, dysuria, hematuria, urinary  Frequency, nocturia, numbness, tingling, seizures,  Focal weakness, Loss of consciousness,  Tremor, insomnia, depression, anxiety, and suicidal ideation.      Objective:  BP (!) 144/66 (BP Location: Left Arm, Patient Position: Sitting, Cuff Size: Normal)    Pulse (!) 52    Temp  (!) 97.5 F (36.4 C) (Oral)    Ht 5' 11"  (1.803 m)    Wt 173 lb 12.8 oz (78.8 kg)    SpO2 97%    BMI 24.24 kg/m   BP Readings from Last 3 Encounters:  07/09/21 (!) 144/66  06/24/21 108/60  06/19/21 132/78    Wt Readings from Last 3 Encounters:  07/09/21 173 lb 12.8 oz (78.8 kg)  06/24/21 169 lb (76.7 kg)  06/18/21 180 lb (81.6 kg)    General appearance: alert, cooperative and appears stated age Ears: normal TM's and external ear canals both ears Throat: lips, mucosa, and tongue normal; teeth and gums normal Neck: no adenopathy, no carotid bruit, supple, symmetrical, trachea midline and thyroid not enlarged, symmetric, no tenderness/mass/nodules Back: symmetric, no curvature. ROM normal. No CVA tenderness. Lungs: clear to auscultation bilaterally Heart: regular rate and rhythm, S1, S2 normal, no murmur, click, rub or gallop Abdomen: soft, non-tender; bowel sounds normal; no masses,  no organomegaly Pulses: 2+ and symmetric Skin: Skin color, texture, turgor normal. No rashes or lesions Lymph nodes: Cervical, supraclavicular, and axillary nodes normal.  Lab Results  Component Value Date   HGBA1C 7.2 (H) 06/19/2021   HGBA1C 6.7 (H) 10/09/2020   HGBA1C 7.2 (H) 07/09/2020    Lab Results  Component Value Date   CREATININE 0.97 06/29/2021   CREATININE 0.63 06/19/2021   CREATININE 1.04 06/18/2021    Lab Results  Component Value Date   WBC 6.8 06/29/2021   HGB 13.3 06/29/2021   HCT 41.1 06/29/2021   PLT 205.0 06/29/2021   GLUCOSE 97 06/29/2021   CHOL 118 06/29/2021   TRIG 121.0 06/29/2021   HDL 52.80 06/29/2021   LDLDIRECT 193.0 05/18/2017   LDLCALC 41 06/29/2021   ALT 24 06/29/2021   AST 24 06/29/2021   NA 143 06/29/2021   K 4.2 06/29/2021   CL 106 06/29/2021   CREATININE 0.97 06/29/2021   BUN 20 06/29/2021   CO2 29 06/29/2021   TSH 8.83 (H) 06/29/2021   PSA 0.42 04/03/2018   HGBA1C 7.2 (H) 06/19/2021   MICROALBUR 0.8 06/29/2021     Assessment & Plan:    Problem List Items Addressed This Visit     Hypertension associated with type 2 diabetes mellitus ([place])    All htn meds were suspended during er visit for syncope and have not been resumed. He is wearing an event monitor and follows up with cardiology . His glycemic control has dipped slightly.  Advised to increase the Jardiance to 25 mg daily   Lab Results  Component Value Date   HGBA1C 7.2 (H) 06/19/2021   Lab Results  Component Value Date   LABMICR See below: 01/30/2020   LABMICR See below: 01/02/2020   MICROALBUR 0.8 06/29/2021   MICROALBUR 0.7 07/09/2020           Relevant Medications   empagliflozin (JARDIANCE) 25 MG TABS tablet   Hypothyroidism - Primary  Correct administration of Medication restarted in  mid January ,  At previus dose of 75 mcg .  Repeat tsh due early march (he never saw endocrinolgy last summer)      Relevant Orders   TSH   Aortic atherosclerosis ([place])    Reviewed findings of prior CT scan today..  Patient is tolerating high potency statin therapy        I spent 30 minutes dedicated to the care of this patient on the date of this encounter to include pre-visit review of patient's medical history,  most recent imaging studies, Face-to-face time with the patient , and post visit ordering of testing and therapeutics.    Follow-up: Return in about 3 months (around 10/06/2021) for follow up diabetes.   Crecencio Mc, MD

## 2021-07-09 NOTE — Assessment & Plan Note (Addendum)
All htn meds were suspended during er visit for syncope and have not been resumed. He is wearing an event monitor and follows up with cardiology . His glycemic control has dipped slightly.  Advised to increase the Jardiance to 25 mg daily   Lab Results  Component Value Date   HGBA1C 7.2 (H) 06/19/2021   Lab Results  Component Value Date   LABMICR See below: 01/30/2020   LABMICR See below: 01/02/2020   MICROALBUR 0.8 06/29/2021   MICROALBUR 0.7 07/09/2020

## 2021-07-09 NOTE — Patient Instructions (Signed)
RETURN FOR A THYROID LAB IN EARLY MARCH (6 WEEKS NEEDED FROM RESTART OF MEDICATION)  FOLLOW UP WITH ME FOR DIABETES IN EARLY MAY   GET A SEPARATE PILL BOX FOR 7 DAYS OF THYROID AND KEEP ON NIGHTSTAND

## 2021-07-09 NOTE — Assessment & Plan Note (Signed)
Reviewed findings of prior CT scan today..  Patient is tolerating high potency statin therapy  

## 2021-07-21 ENCOUNTER — Ambulatory Visit: Payer: PPO | Admitting: Cardiovascular Disease

## 2021-07-21 ENCOUNTER — Encounter: Payer: Self-pay | Admitting: Cardiovascular Disease

## 2021-07-21 ENCOUNTER — Other Ambulatory Visit: Payer: Self-pay

## 2021-07-21 VITALS — BP 112/48 | HR 53 | Ht 71.0 in | Wt 172.0 lb

## 2021-07-21 DIAGNOSIS — I25708 Atherosclerosis of coronary artery bypass graft(s), unspecified, with other forms of angina pectoris: Secondary | ICD-10-CM

## 2021-07-21 DIAGNOSIS — I700 Atherosclerosis of aorta: Secondary | ICD-10-CM

## 2021-07-21 DIAGNOSIS — I152 Hypertension secondary to endocrine disorders: Secondary | ICD-10-CM

## 2021-07-21 DIAGNOSIS — E1159 Type 2 diabetes mellitus with other circulatory complications: Secondary | ICD-10-CM

## 2021-07-21 DIAGNOSIS — E785 Hyperlipidemia, unspecified: Secondary | ICD-10-CM

## 2021-07-21 NOTE — Progress Notes (Signed)
Cardiology Office Note  Date:  07/21/2021   ID:  Brett, [name] [birth year], MRN [id number]  PCP:  Brett Mc, MD   Chief Complaint  Patient presents with   [facility] follow up     Discuss Zio monitor results. Medications reviewed by the patient verbally.     HPI:  [name] is a very pleasant 81 year old gentleman with history of  HTN,   DM II, HBA1C 6.7,  Hyperlipidemia,  CAD,  CABG x4 at [facility]   in 2006,  previous stents Depression/insomnia Who presents for routine follow-up of his coronary artery disease  LOV 08/15/2019  Admission to the hospital January 2023 for syncope 7 PM stood up out of her recliner to go to another room when wife appreciated a loud noise, found him on the ground.  Took him 15 seconds to recover, he was groggy, she tried several times to sit him up, EMTs called.  On their evaluation he was mentating better but on further observation had recurrent near syncope with low blood pressure and bradycardia.  Orthostatics positive with EMTs and in the emergency room as documented in H&P, 30-40 point drop with standing Wife reports he drinks soda, not much of other beverages, at bedtime taking his Micardis and Flomax at 4 PM, syncope at 7 PM. on Jardiance several weeks heart rate quickly down to high 40s and back up to low 50s  Markedly orthostatic in the field and in the ER on arrival Likely polypharmacy including Flomax, metoprolol, Micardis, Jardiance, hypokalemia -Also in the setting of bradycardia on metoprolol --Recommendation to hold metoprolol, Micardis, Flomax Recommend to increase fluid intake not just soda --We have ordered a ZIO monitor given his bradycardia  Echo reviewed, ejection fraction 60 to 65%  Zio monitor Patient had a min HR of 39 bpm, max HR of 152 bpm, and avg HR of 56 bpm. Predominant underlying rhythm was Sinus Rhythm. First Degree AV Block was present. 3 Supraventricular Tachycardia runs occurred, the run with the  fastest interval lasting 5 beats  with a max rate of 152 bpm, the longest lasting 5 beats with an avg rate of 117 bpm. Isolated SVEs were rare (<1.0%), SVE Couplets were rare (<1.0%), and SVE Triplets were rare (<1.0%). Isolated VEs were rare (<1.0%), and no VE Couplets or VE Triplets  were present.  Lab work reviewed, LDL 64  Was volunteering today, did not drink much fluids  CT scan abdomen pelvis pulled up and reviewed, mild to moderate aortic atherosclerosis No aorta dilation concerning for aneurysm  EKG personally reviewed by myself on todays visit Sinus bradycardia rate 53 bpm no significant ST-T wave changes  PMH:   has a past medical history of Allergy, Calcific Achilles tendonitis, Calculus of ureter, Coronary artery disease, Diabetes mellitus, H/O renal calculi (2012), Hematuria, unspecified, Hyperlipidemia, Hypertension, Impotence of organic origin, Myocardial infarction [facility]), Peyronie's disease, Pulmonary nodule seen on imaging study (2012), Sleep difficulties, Swelling, mass, or lump in chest, and Unspecified hypothyroidism.  PSH:    Past Surgical History:  Procedure Laterality Date   CARDIAC CATHETERIZATION  2001   [facility] x3 stent   COLONOSCOPY     COLONOSCOPY WITH PROPOFOL N/A 01/24/2017   Procedure: COLONOSCOPY WITH PROPOFOL;  Surgeon: Manya Silvas, MD;  Location: [facility] ENDOSCOPY;  Service: Endoscopy;  Laterality: N/A;   CORONARY ARTERY BYPASS GRAFT  2006   4 vessel, after 3 or 4 stents    CORONARY ARTERY BYPASS GRAFT  CYSTOSCOPY W/ RETROGRADES  01/18/2020   Procedure: CYSTOSCOPY WITH RETROGRADE PYELOGRAM;  Surgeon: Billey Co, MD;  Location: [facility] ORS;  Service: Urology;;   CYSTOSCOPY/URETEROSCOPY/HOLMIUM LASER/STENT PLACEMENT Right 01/18/2020   Procedure: CYSTOSCOPY/URETEROSCOPY/HOLMIUM LASER/STENT PLACEMENT;  Surgeon: Billey Co, MD;  Location: [facility] ORS;  Service: Urology;  Laterality: Right;   OSTECTOMY CALCANEUS Left    PENILE  PROSTHESIS IMPLANT      Current Outpatient Medications  Medication Sig Dispense Refill   acetaminophen (TYLENOL) 500 MG tablet Take 1-2 tablets (500-1,000 mg total) by mouth in the morning, at noon, and at bedtime.     aspirin EC 81 MG tablet Take 81 mg by mouth daily.     atorvastatin (LIPITOR) 80 MG tablet Take 1 tablet (80 mg total) by mouth daily. 90 tablet 3   Cholecalciferol (VITAMIN D) 50 MCG (2000 UT) tablet Take 2,000 Units by mouth daily.     DULoxetine (CYMBALTA) 30 MG capsule TAKE 1 CAPSULE BY MOUTH EVERY DAY 90 capsule 1   empagliflozin (JARDIANCE) 25 MG TABS tablet Take 1 tablet (25 mg total) by mouth daily. 90 tablet 1   ezetimibe (ZETIA) 10 MG tablet Take 1 tablet (10 mg total) by mouth daily. 90 tablet 3   FREESTYLE LITE test strip CHECK BLOOD SUGAR DAILY AS DIRECTED 100 strip 5   glucose monitoring kit (FREESTYLE) monitoring kit 1 each by Does not apply route as needed for other. For use daily to monitor diabetes.  Please include lancets .  Test once daily   E11.9 1 each 3   Lancets (FREESTYLE) lancets Use as instructed 100 each 12   levothyroxine (SYNTHROID) 100 MCG tablet Take 1 tablet (100 mcg total) by mouth daily. 30 minutes before food in am 90 tablet 3   Multiple Vitamin (MULTIVITAMIN) tablet Take 1 tablet by mouth daily.     potassium chloride SA (KLOR-CON M) 20 MEQ tablet Take 1 tablet (20 mEq total) by mouth daily. 5 days (Patient not taking: Reported on 07/09/2021) 5 tablet 0   No current facility-administered medications for this visit.     Allergies:   Metformin and related, Simvastatin, and Eszopiclone   Social History:  The patient  reports that he quit smoking about 39 years ago. His smoking use included cigars. He has never used smokeless tobacco. He reports current alcohol use of about 1.0 - 2.0 standard drink per week. He reports that he does not use drugs.   Family History:   family history includes AAA (abdominal aortic aneurysm) in his mother; Heart  disease (age of onset: 24) in his father; Hyperlipidemia in his mother.    Review of Systems: Review of Systems  Constitutional: Negative.   HENT: Negative.    Respiratory: Negative.    Cardiovascular: Negative.   Gastrointestinal: Negative.   Musculoskeletal: Negative.   Neurological: Negative.   Psychiatric/Behavioral: Negative.    All other systems reviewed and are negative.  PHYSICAL EXAM: VS:  BP (!) 100/48 (BP Location: Left Arm, Patient Position: Sitting, Cuff Size: Normal)    Pulse (!) 53    Ht 5' 11"  (1.803 m)    Wt 172 lb (78 kg)    SpO2 98%    BMI 23.99 kg/m  , BMI Body mass index is 23.99 kg/m. Constitutional:  oriented to person, place, and time. No distress.  HENT:  Head: Grossly normal Eyes:  no discharge. No scleral icterus.  Neck: No JVD, no carotid bruits  Cardiovascular: Regular rate and rhythm, no murmurs appreciated  Pulmonary/Chest: Clear to auscultation bilaterally, no wheezes or rails Abdominal: Soft.  no distension.  no tenderness.  Musculoskeletal: Normal range of motion Neurological:  normal muscle tone. Coordination normal. 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 No further workup at this time. Continue current medication regimen. Cholesterol at goal  Essential hypertension -  Given recent episode of syncope, off all blood  pressure medications, blood pressure on my recheck 370 systolic Recommend he stay hydrated  Diabetes mellitus without complication (HCC) Hemoglobin A1c 7 Okay to stay on Jardiance 25  Hyperlipidemia LDL goal <70 Cholesterol at goal  Syncope Secondary to orthostasis Medications held in the hospital   Total encounter time more than 30 minutes  Greater than 50% was spent in counseling and coordination of care with the patient   Orders Placed This Encounter  Procedures   EKG 12-Lead     Signed, Esmond Plants, M.D., Ph.D. 07/21/2021  [place], [place]

## 2021-07-21 NOTE — Patient Instructions (Addendum)
Medication Instructions:  Your physician recommends that you continue on your current medications as directed. Please refer to the Current Medication list given to you today.  If you need a refill on your cardiac medications before your next appointment, please call your pharmacy.   Lab work: No new labs needed  Testing/Procedures: No new testing needed  Follow-Up: At [facility], you and your health needs are our priority.  As part of our continuing mission to provide you with exceptional heart care, we have created designated Provider Care Teams.  These Care Teams include your primary Cardiologist (physician) and Advanced Practice Providers (APPs -  Physician Assistants and Nurse Practitioners) who all work together to provide you with the care you need, when you need it.  You will need a follow up appointment in 12 months  Providers on your designated Care Team:   Christopher Berge, NP Ryan Dunn, PA-C Cadence Furth, PA-C  COVID-19 Vaccine Information can be found at: [email or web address] For questions related to vaccine distribution or appointments, please email [email or web address] or call [phone].   

## 2021-08-05 ENCOUNTER — Other Ambulatory Visit (INDEPENDENT_AMBULATORY_CARE_PROVIDER_SITE_OTHER): Payer: PPO

## 2021-08-05 ENCOUNTER — Other Ambulatory Visit: Payer: Self-pay

## 2021-08-05 ENCOUNTER — Other Ambulatory Visit: Payer: Self-pay | Admitting: Cardiovascular Disease

## 2021-08-05 DIAGNOSIS — E034 Atrophy of thyroid (acquired): Secondary | ICD-10-CM | POA: Diagnosis not present

## 2021-08-05 LAB — TSH: TSH: 0.65 u[IU]/mL (ref 0.35–5.50)

## 2021-08-11 ENCOUNTER — Ambulatory Visit: Payer: PPO | Admitting: Pharmacist

## 2021-08-11 DIAGNOSIS — E1159 Type 2 diabetes mellitus with other circulatory complications: Secondary | ICD-10-CM

## 2021-08-11 DIAGNOSIS — I700 Atherosclerosis of aorta: Secondary | ICD-10-CM

## 2021-08-11 DIAGNOSIS — E119 Type 2 diabetes mellitus without complications: Secondary | ICD-10-CM

## 2021-08-11 NOTE — Chronic Care Management (AMB) (Signed)
? ?Chronic Care Management ?CCM Pharmacy Note ? ?08/11/2021 ?Name:  Brett [name] MRN:  [id number] DOB:  1940-12-08 ? ?Summary: ?- Tolerating regimen well. Denies questions or concerns today ? ?Recommendations/Changes made from today's visit: ?- Recommended to continue current regimen at this time ? ?Subjective: ?Brett [name] is an 81 y.o. year old male who is a primary patient of Tullo, Aris Everts, MD.  The CCM team was consulted for assistance with disease management and care coordination needs.   ? ?Engaged with patient by telephone for follow up visit for pharmacy case management and/or care coordination services.  ? ?Objective: ? ?Medications Reviewed Today   ? ? Reviewed by De Hollingshead, RPH-CPP (Pharmacist) on 08/11/21 at 1432  Med List Status: <None>  ? ?Medication Order Taking? Sig Documenting Provider Last Dose Status Informant  ?acetaminophen (TYLENOL) 500 MG tablet [id number] Yes Take 1-2 tablets (500-1,000 mg total) by mouth in the morning, at noon, and at bedtime. Edwin Dada, MD Taking Active   ?aspirin EC 81 MG tablet [id number] Yes Take 81 mg by mouth daily. [provider] Taking Active Self  ?atorvastatin (LIPITOR) 80 MG tablet [id number] Yes Take 1 tablet (80 mg total) by mouth daily. Minna Merritts, MD Taking Active Family Member  ?Cholecalciferol (VITAMIN D) 50 MCG (2000 UT) tablet [id number] Yes Take 2,000 Units by mouth daily. [provider] Taking Active Family Member  ?DULoxetine (CYMBALTA) 30 MG capsule [id number] Yes TAKE 1 CAPSULE BY MOUTH EVERY DAY Crecencio Mc, MD Taking Active Family Member  ?empagliflozin (JARDIANCE) 25 MG TABS tablet [id number] Yes Take 1 tablet (25 mg total) by mouth daily. Crecencio Mc, MD Taking Active   ?ezetimibe (ZETIA) 10 MG tablet [id number] Yes Take 1 tablet (10 mg total) by mouth daily. Minna Merritts, MD Taking Active Family Member  ?FREESTYLE LITE test strip [id number]  CHECK BLOOD SUGAR DAILY AS DIRECTED Crecencio Mc,  MD  Active Family Member  ?glucose monitoring kit (FREESTYLE) monitoring kit [id number]  1 each by Does not apply route as needed for other. For use daily to monitor diabetes.  Please include lancets .  Test once daily   E11.9 Crecencio Mc, MD  Active Family Member  ?Lancets (FREESTYLE) lancets [id number]  Use as instructed Crecencio Mc, MD  Active Family Member  ?levothyroxine (SYNTHROID) 100 MCG tablet [id number] Yes Take 1 tablet (100 mcg total) by mouth daily. 30 minutes before food in am McLean-Scocuzza, Nino Glow, MD Taking Active   ?Multiple Vitamin (MULTIVITAMIN) tablet [id number] Yes Take 1 tablet by mouth daily. [provider] Taking Active   ? ?  ?  ? ?  ? ? ?Pertinent Labs:   ?Lab Results  ?Component Value Date  ? HGBA1C 7.2 (H) 06/19/2021  ? ?Lab Results  ?Component Value Date  ? CHOL 118 06/29/2021  ? HDL 52.80 06/29/2021  ? [place] 41 06/29/2021  ? LDLDIRECT 193.0 05/18/2017  ? TRIG 121.0 06/29/2021  ? CHOLHDL 2 06/29/2021  ? ?Lab Results  ?Component Value Date  ? CREATININE 0.97 06/29/2021  ? BUN 20 06/29/2021  ? NA 143 06/29/2021  ? K 4.2 06/29/2021  ? CL 106 06/29/2021  ? CO2 29 06/29/2021  ? ? ?SDOH:  (Social Determinants of Health) assessments and interventions performed:  ?SDOH Interventions   ? ?Flowsheet Row Most Recent Value  ?SDOH Interventions   ?Financial Strain Interventions Intervention Not Indicated  ? ?  ? ? ?[place] ? ?Review of patient past medical  history, allergies, medications, health status, including review of consultants reports, laboratory and other test data, was performed as part of comprehensive evaluation and provision of chronic care management services.  ? ?Care Plan : Medication Monitoring  ?Updates made by De Hollingshead, RPH-CPP since 08/11/2021 12:00 AM  ?Completed 08/11/2021  ? ?Problem: Diabetes, CAD, HTN Resolved 08/11/2021  ?  ? ?Long-Range Goal: Medication Management Completed 08/11/2021  ?Start Date: 02/17/2021  ?Recent Progress: On track   ?Priority: High  ?Note:   ?Current Barriers:  ?Unable to independently afford treatment regimen ? ?Pharmacist Clinical Goal(s):  ?Over the next 90 days, patient will verbalize ability to afford treatment regimen through collaboration with PharmD and provider.  ? ?Interventions: ?1:1 collaboration with Crecencio Mc, MD regarding development and update of comprehensive plan of care as evidenced by provider attestation and co-signature ?Inter-disciplinary care team collaboration (see longitudinal plan of care) ?Comprehensive medication review performed; medication list updated in electronic medical record ? ?Health Maintenance ?Yearly diabetic eye exam: up to date ?Yearly diabetic foot exam: due- encourage at next PCP visit ?Urine microalbumin: up to date ?Yearly influenza vaccination: up to date  ?Td/Tdap vaccination: up to date ?Pneumonia vaccination: up to date ?COVID vaccinations: up to date  ?Shingrix vaccinations: up to date ?Colonoscopy: up to date ? ? ?Diabetes: ?Controlled; current treatment: Jardiance 25 mg daily ?Denies GU symptoms, dehydration, lightheadedness, or dizziness ?Reports brain fog with metformin ?Current glucose readings: fasting glucose: 80-110 ?Reviewed goal fasting, 2 hour post prandial glucose readings. Encouraged periodic home checks of 2 hour post prandials.  ?Continue current regimen at this time ? ? ?Hyperlipidemia and secondary ASCVD prevention: ?Controlled; current treatment: atorvastatin 80 mg daily, ezetimibe 10 mg daily;  ?Previously recommended to continue current regimen at this time ? ?Depression/Anxiety: ?Controlled per patient report; current treatment: duloxetine 30 mg daily ?Previously recommended to continue current regimen at this time ? ?Hypothyroidism: ?Controlled per last lab; current treatment: levothyroxine 100 mcg daily - appropriate administration  ?Previously recommended to continue current regimen at this time ? ? ? ? ?Patient Goals/Self-Care Activities ?Over  the next 90 days, patient will:  ?- take medications as prescribed ?check glucose periodically, document, and provide at future appointments ?collaborate with provider on medication access solutions ?  ?  ? ?Plan: goals of care met. Closing CCM case at this time ? ?Catie Darnelle Maffucci, PharmD, BCACP, CPP ?Clinical Pharmacist ?Therapist, music at [employer] ?[phone] ? ? ? ? ? ?

## 2021-08-11 NOTE — Patient Instructions (Signed)
[place],  ? ?Keep up the great work! ? ?Check your blood sugars, alternating between:  ?1) Fasting, first thing in the morning before breakfast and  ?2) 2 hours after your largest meal.  ? ?For a goal A1c of less than 7%, goal fasting readings are less than 130 and goal 2 hour after meal readings are less than 180.  ? ? ?Catie Darnelle Maffucci, PharmD ? ?Visit Information ? ?Following are the goals we discussed today:  ?Patient Goals/Self-Care Activities ?Over the next 90 days, patient will:  ?- take medications as prescribed ?check glucose periodically, document, and provide at future appointments ?collaborate with provider on medication access solutions ?   ?  ?  ?Plan: goals of care met. Closing CCM case at this time ?  ?Catie Darnelle Maffucci, PharmD, BCACP, CPP ?Clinical Pharmacist ?Therapist, music at [employer] ?[phone] ?  ? ? ?Please call the care guide team at [phone] if you need to cancel or reschedule your appointment.  ? ?Patient verbalizes understanding of instructions and care plan provided today and agrees to view in [place]. Active MyChart status confirmed with patient.   ? ?

## 2021-10-08 ENCOUNTER — Ambulatory Visit: Payer: PPO | Admitting: Internal Medicine

## 2021-10-16 ENCOUNTER — Ambulatory Visit: Payer: PPO | Admitting: Internal Medicine

## 2021-10-19 ENCOUNTER — Encounter: Payer: Self-pay | Admitting: Internal Medicine

## 2021-10-19 ENCOUNTER — Ambulatory Visit (INDEPENDENT_AMBULATORY_CARE_PROVIDER_SITE_OTHER): Payer: PPO | Admitting: Internal Medicine

## 2021-10-19 VITALS — BP 118/68 | HR 59 | Temp 97.9°F | Ht 71.0 in | Wt 173.8 lb

## 2021-10-19 DIAGNOSIS — R944 Abnormal results of kidney function studies: Secondary | ICD-10-CM

## 2021-10-19 DIAGNOSIS — E1165 Type 2 diabetes mellitus with hyperglycemia: Secondary | ICD-10-CM | POA: Diagnosis not present

## 2021-10-19 DIAGNOSIS — E785 Hyperlipidemia, unspecified: Secondary | ICD-10-CM | POA: Diagnosis not present

## 2021-10-19 DIAGNOSIS — E119 Type 2 diabetes mellitus without complications: Secondary | ICD-10-CM | POA: Diagnosis not present

## 2021-10-19 DIAGNOSIS — E1159 Type 2 diabetes mellitus with other circulatory complications: Secondary | ICD-10-CM

## 2021-10-19 DIAGNOSIS — I152 Hypertension secondary to endocrine disorders: Secondary | ICD-10-CM | POA: Diagnosis not present

## 2021-10-19 DIAGNOSIS — I10 Essential (primary) hypertension: Secondary | ICD-10-CM

## 2021-10-19 DIAGNOSIS — E11311 Type 2 diabetes mellitus with unspecified diabetic retinopathy with macular edema: Secondary | ICD-10-CM

## 2021-10-19 LAB — POCT GLYCOSYLATED HEMOGLOBIN (HGB A1C): Hemoglobin A1C: 7.6 % — AB (ref 4.0–5.6)

## 2021-10-19 MED ORDER — EMPAGLIFLOZIN 25 MG PO TABS: 25.0000 mg | ORAL_TABLET | Freq: Every day | ORAL | 1 refills | Status: DC

## 2021-10-19 NOTE — Assessment & Plan Note (Addendum)
Uncontrolled since stopping Jardiance due to cost.  Advised him to reconsider; he can get the medication for $33/month at the [facility]  90 day rx given .  Foot exam is normal today  ?

## 2021-10-19 NOTE — Patient Instructions (Signed)
I recommend resuming Jardiance to lower your a1c and protect your heart from future events;  a  ?90 day supply has been sent to the [facility]  ? ?Continue all other medications . ? ? ?Please return in 3 months for repeat testing of A1c  ? ? ?

## 2021-10-19 NOTE — Progress Notes (Signed)
? ?Subjective:  ?Patient ID: Brett [name], male    DOB: [birth year]  Age: 81 y.o. MRN: [id number] ? ?CC: The primary encounter diagnosis was Hypertension associated with type 2 diabetes mellitus ([place]). 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 ? ? ?HPI ?Brett [name] presents for  ?Chief Complaint  ?Patient presents with  ? Follow-up  ?  3 month follow up   ? ?1) T2DM:   ?He feels generally well, is exercising several times per week ,  not checking blood sugars.   Denies any recent hypoglyemic events.   No longer taking jardiance due to cost  Following a carbohydrate modified diet 5 days per week. Denies numbness, burning and tingling of extremities. Appetite is good.  Eating too many desserts.  Walking daily for 45 minutes  ?   ? ?2) CAD;  denies chest pain exertional dyspnea and claudication .  Tolerating meds  ? ? ?Outpatient Medications Prior to Visit  ?Medication Sig Dispense Refill  ? acetaminophen (TYLENOL) 500 MG tablet Take 1-2 tablets (500-1,000 mg total) by mouth in the morning, at noon, and at bedtime.    ? aspirin EC 81 MG tablet Take 81 mg by mouth daily.    ? atorvastatin (LIPITOR) 80 MG tablet Take 1 tablet (80 mg total) by mouth daily. 90 tablet 3  ? Cholecalciferol (VITAMIN D) 50 MCG (2000 UT) tablet Take 2,000 Units by mouth daily.    ? DULoxetine (CYMBALTA) 30 MG capsule TAKE 1 CAPSULE BY MOUTH EVERY DAY 90 capsule 1  ? ezetimibe (ZETIA) 10 MG tablet Take 1 tablet (10 mg total) by mouth daily. 90 tablet 3  ? FREESTYLE LITE test strip CHECK BLOOD SUGAR DAILY AS DIRECTED 100 strip 5  ? glucose monitoring kit (FREESTYLE) monitoring kit 1 each by Does not apply route as needed for other. For use daily to monitor diabetes.  Please include lancets .  Test once  daily   E11.9 1 each 3  ? Lancets (FREESTYLE) lancets Use as instructed 100 each 12  ? levothyroxine (SYNTHROID) 100 MCG tablet Take 1 tablet (100 mcg total) by mouth daily. 30 minutes before food in am 90 tablet 3  ? Multiple Vitamin (MULTIVITAMIN) tablet Take 1 tablet by mouth daily.    ? empagliflozin (JARDIANCE) 25 MG TABS tablet Take 1 tablet (25 mg total) by mouth daily. (Patient not taking: Reported on 10/19/2021) 90 tablet 1  ? ?No facility-administered medications prior to visit.  ? ? ?Review of Systems; ? ?Patient denies headache, fevers, malaise, unintentional weight loss, skin rash, eye pain, sinus congestion and sinus pain, sore throat, dysphagia,  hemoptysis , cough, dyspnea, wheezing, chest pain, palpitations, orthopnea, edema, abdominal pain, nausea, melena, diarrhea, constipation, flank pain, dysuria, hematuria, urinary  Frequency, nocturia, numbness, tingling, seizures,  Focal weakness, Loss of consciousness,  Tremor, insomnia, depression, anxiety, and suicidal ideation.   ? ? ? ?Objective:  ?BP 118/68 (BP Location: Left Arm, Patient Position: Sitting, Cuff Size: Normal)   Pulse (!) 59   Temp 97.9 ?F (36.6 ?C) (Oral)   Ht 5' 11"  (1.803 m)   Wt 173 lb 12.8 oz (78.8 kg)   SpO2 98%   BMI 24.24 kg/m?  ? ?BP Readings from Last 3 Encounters:  ?10/19/21 118/68  ?07/21/21 (!) 112/48  ?07/09/21 (!) 144/66  ? ? ?  Wt Readings from Last 3 Encounters:  ?10/19/21 173 lb 12.8 oz (78.8 kg)  ?07/21/21 172 lb (78 kg)  ?07/09/21 173 lb 12.8 oz (78.8 kg)  ? ? ?General appearance: alert, cooperative and appears stated age ?Ears: normal TM's and external ear canals both ears ?Throat: lips, mucosa, and tongue normal; teeth and gums normal ?Neck: no adenopathy, no carotid bruit, supple, symmetrical, trachea midline and thyroid not enlarged, symmetric, no tenderness/mass/nodules ?Back: symmetric, no curvature. ROM normal. No CVA tenderness. ?Lungs: clear to auscultation bilaterally ?Heart: regular rate and rhythm, S1,  S2 normal, no murmur, click, rub or gallop ?Abdomen: soft, non-tender; bowel sounds normal; no masses,  no organomegaly ?Pulses: 2+ and symmetric ?Skin: Skin color, texture, turgor normal. No rashes or lesions ?Lymph nodes: Cervical, supraclavicular, and axillary nodes normal. ? ?Lab Results  ?Component Value Date  ? HGBA1C 7.6 (A) 10/19/2021  ? HGBA1C 7.2 (H) 06/19/2021  ? HGBA1C 6.7 (H) 10/09/2020  ? ? ?Lab Results  ?Component Value Date  ? CREATININE 0.97 06/29/2021  ? CREATININE 0.63 06/19/2021  ? CREATININE 1.04 06/18/2021  ? ? ?Lab Results  ?Component Value Date  ? WBC 6.8 06/29/2021  ? HGB 13.3 06/29/2021  ? HCT 41.1 06/29/2021  ? PLT 205.0 06/29/2021  ? GLUCOSE 97 06/29/2021  ? CHOL 118 06/29/2021  ? TRIG 121.0 06/29/2021  ? HDL 52.80 06/29/2021  ? LDLDIRECT 193.0 05/18/2017  ? [place] 41 06/29/2021  ? ALT 24 06/29/2021  ? AST 24 06/29/2021  ? NA 143 06/29/2021  ? K 4.2 06/29/2021  ? CL 106 06/29/2021  ? CREATININE 0.97 06/29/2021  ? BUN 20 06/29/2021  ? CO2 29 06/29/2021  ? TSH 0.65 08/05/2021  ? PSA 0.42 04/03/2018  ? HGBA1C 7.6 (A) 10/19/2021  ? MICROALBUR 0.8 06/29/2021  ? ? ?ECHOCARDIOGRAM COMPLETE ? ?Result Date: 06/19/2021 ?   ECHOCARDIOGRAM REPORT   Patient Name:   Brett [name] Date of Exam: 06/19/2021 Medical Rec #:  [id number]  Height:       71.0 in Accession #:    [id number] Weight:       180.0 lb Date of Birth:  1941/06/01 BSA:          2.016 m? Patient Age:    64 years   BP:           112/69 mmHg Patient Gender: M          HR:           53 bpm. Exam Location:  [facility] Procedure: 2D Echo, Cardiac Doppler and Color Doppler Indications:     Syncope R55  History:         Patient has no prior history of Echocardiogram examinations.                  Previous Myocardial Infarction; Risk Factors:Diabetes and                  Hypertension.  Sonographer:     Sherrie Sport Referring Phys:  [id number] AMY N COX Diagnosing Phys: Ida Rogue MD IMPRESSIONS  1. Left ventricular ejection fraction, by estimation, is 60 to  65%. The left ventricle has normal function. The left ventricle has no regional wall motion abnormalities. Left ventricular diastolic parameters are consistent with Grade I diastolic dysfunction (impaired relaxation).  2. Right ventricular systolic function is normal. The right ventricular size is normal.  3. Left atrial size was mildly dilated.  4. The mitral valve is normal in structure. Mild mitral valve  regurgitation. No evidence of mitral stenosis.  5. The aortic valve is normal in structure. Aortic valve regurgitation is not visualized. Aortic valve sclerosis/calcification is present, without any evidence of aortic stenosis.  6. The inferior vena cava is normal in size with greater than 50% respiratory variability, suggesting right atrial pressure of 3 mmHg. FINDINGS  Left Ventricle: Left ventricular ejection fraction, by estimation, is 60 to 65%. The left ventricle has normal function. The left ventricle has no regional wall motion abnormalities. The left ventricular internal cavity size was normal in size. There is  no left ventricular hypertrophy. Left ventricular diastolic parameters are consistent with Grade I diastolic dysfunction (impaired relaxation). Right Ventricle: The right ventricular size is normal. No increase in right ventricular wall thickness. Right ventricular systolic function is normal. Left Atrium: Left atrial size was mildly dilated. Right Atrium: Right atrial size was normal in size. Pericardium: There is no evidence of pericardial effusion. Mitral Valve: The mitral valve is normal in structure. Mild mitral valve regurgitation. No evidence of mitral valve stenosis. MV peak gradient, 3.6 mmHg. The mean mitral valve gradient is 1.0 mmHg. Tricuspid Valve: The tricuspid valve is normal in structure. Tricuspid valve regurgitation is not demonstrated. No evidence of tricuspid stenosis. Aortic Valve: The aortic valve is normal in structure. Aortic valve regurgitation is not visualized. Aortic  valve sclerosis/calcification is present, without any evidence of aortic stenosis. Aortic valve mean gradient measures 2.0 mmHg. Aortic valve peak  gradient measures 4.4 mmHg. Aortic valve area, by VTI measure

## 2021-10-19 NOTE — Assessment & Plan Note (Signed)
Managed with Zetia and lipitor without muscle pain  ?

## 2021-10-20 LAB — LIPID PANEL
Cholesterol: 116 mg/dL (ref 0–200)
HDL: 52.2 mg/dL (ref >39.00)
LDL Cholesterol: 38 mg/dL (ref 0–99)
NonHDL: 64.08
Total CHOL/HDL Ratio: 2
Triglycerides: 130 mg/dL (ref 0.0–149.0)
VLDL: 26 mg/dL (ref 0.0–40.0)

## 2021-10-20 LAB — COMPREHENSIVE METABOLIC PANEL
ALT: 25 U/L (ref 0–53)
AST: 26 U/L (ref 0–37)
Albumin: 4.4 g/dL (ref 3.5–5.2)
Alkaline Phosphatase: 52 U/L (ref 39–117)
BUN: 23 mg/dL (ref 6–23)
CO2: 26 mEq/L (ref 19–32)
Calcium: 9.4 mg/dL (ref 8.4–10.5)
Chloride: 104 mEq/L (ref 96–112)
Creatinine, Ser: 1.27 mg/dL (ref 0.40–1.50)
GFR: 54.09 mL/min — ABNORMAL LOW (ref >60.00)
Glucose, Bld: 98 mg/dL (ref 70–99)
Potassium: 4.1 mEq/L (ref 3.5–5.1)
Sodium: 139 mEq/L (ref 135–145)
Total Bilirubin: 0.7 mg/dL (ref 0.2–1.2)
Total Protein: 7.1 g/dL (ref 6.0–8.3)

## 2021-10-20 LAB — LDL CHOLESTEROL, DIRECT: Direct LDL: 48 mg/dL

## 2021-10-22 NOTE — Addendum Note (Signed)
Addended by: Crecencio Mc on: 10/22/2021 01:09 PM   Modules accepted: Orders

## 2021-10-23 ENCOUNTER — Other Ambulatory Visit: Payer: Self-pay | Admitting: Cardiovascular Disease

## 2021-10-23 ENCOUNTER — Other Ambulatory Visit: Payer: Self-pay | Admitting: Internal Medicine

## 2022-01-28 ENCOUNTER — Ambulatory Visit: Payer: PPO | Admitting: Internal Medicine

## 2022-02-01 ENCOUNTER — Encounter: Payer: Self-pay | Admitting: Internal Medicine

## 2022-02-01 ENCOUNTER — Ambulatory Visit (INDEPENDENT_AMBULATORY_CARE_PROVIDER_SITE_OTHER): Payer: PPO | Admitting: Internal Medicine

## 2022-02-01 VITALS — BP 120/70 | HR 71 | Temp 97.7°F | Resp 14 | Ht 71.0 in | Wt 171.2 lb

## 2022-02-01 DIAGNOSIS — E11311 Type 2 diabetes mellitus with unspecified diabetic retinopathy with macular edema: Secondary | ICD-10-CM | POA: Diagnosis not present

## 2022-02-01 DIAGNOSIS — E119 Type 2 diabetes mellitus without complications: Secondary | ICD-10-CM

## 2022-02-01 DIAGNOSIS — E034 Atrophy of thyroid (acquired): Secondary | ICD-10-CM | POA: Diagnosis not present

## 2022-02-01 DIAGNOSIS — E785 Hyperlipidemia, unspecified: Secondary | ICD-10-CM

## 2022-02-01 DIAGNOSIS — I951 Orthostatic hypotension: Secondary | ICD-10-CM

## 2022-02-01 DIAGNOSIS — G3184 Mild cognitive impairment, so stated: Secondary | ICD-10-CM | POA: Diagnosis not present

## 2022-02-01 DIAGNOSIS — F418 Other specified anxiety disorders: Secondary | ICD-10-CM | POA: Diagnosis not present

## 2022-02-01 DIAGNOSIS — I700 Atherosclerosis of aorta: Secondary | ICD-10-CM | POA: Diagnosis not present

## 2022-02-01 LAB — POCT GLYCOSYLATED HEMOGLOBIN (HGB A1C): Hemoglobin A1C: 7.3 % — AB (ref 4.0–5.6)

## 2022-02-01 MED ORDER — AZITHROMYCIN 500 MG PO TABS: 500.0000 mg | ORAL_TABLET | Freq: Every day | ORAL | 0 refills | Status: DC

## 2022-02-01 NOTE — Progress Notes (Unsigned)
Subjective:  Patient ID: Brett [name], male    DOB: Dec 17, 1940  Age: 81 y.o. MRN: [id number]  CC: The primary encounter diagnosis was Hypothyroidism due to acquired atrophy of thyroid. 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   HPI Brett [name] presents follow up on multiple conditions.  He is accompanied by his significant other, Patsy Lager  Chief Complaint  Patient presents with   Follow-up    3 mon, diabetes     1) Type 2 DM:  he is tolerating use of jardiance without nocturia. No hypoglycemic symptoms, does not check blood sugars.  Appetite is increased per Vinnie Levelhe is constantly hungry")     wallking very other day for about  45 mintues.  Denies lower extremity leg pin chest pain and dys[nea No cramping.     2) Hypothyroid:  Vinnie Level notes that his appetite has increased  since thyroid medication has been taken apart from other meds   3) Cognitive impairment . Vinnie Level notes that his short term memory has been poor.  She is reminding him to take his medications  Sleeps 9 -10 hours per night,  goes back to sleep after eating breakfast . He reports that he wakes up refreshed,  and she reports that he does not snore.  Has 1-2 drinks per night at dinner time ( 2 shots of bourbon)    Outpatient Medications Prior to Visit  Medication Sig Dispense Refill   acetaminophen (TYLENOL) 500 MG tablet Take 1-2 tablets (500-1,000 mg total) by mouth in the morning, at noon, and at bedtime.     aspirin EC 81 MG tablet Take 81 mg by mouth daily.     atorvastatin (LIPITOR) 80 MG tablet TAKE 1 TABLET BY MOUTH EVERY DAY 90 tablet 2    Cholecalciferol (VITAMIN D) 50 MCG (2000 UT) tablet Take 2,000 Units by mouth daily.     DULoxetine (CYMBALTA) 30 MG capsule TAKE 1 CAPSULE BY MOUTH EVERY DAY 90 capsule 1   empagliflozin (JARDIANCE) 25 MG TABS tablet Take 1 tablet (25 mg total) by mouth daily before breakfast. 90 tablet 1   ezetimibe (ZETIA) 10 MG tablet TAKE 1 TABLET BY MOUTH EVERY DAY 90 tablet 2   FREESTYLE LITE test strip CHECK BLOOD SUGAR DAILY AS DIRECTED 100 strip 5   glucose monitoring kit (FREESTYLE) monitoring kit 1 each by Does not apply route as needed for other. For use daily to monitor diabetes.  Please include lancets .  Test once daily   E11.9 1 each 3   Lancets (FREESTYLE) lancets Use as instructed 100 each 12   levothyroxine (SYNTHROID) 100 MCG tablet Take 1 tablet (100 mcg total) by mouth daily. 30 minutes before food in am 90 tablet 3   Multiple Vitamin (MULTIVITAMIN) tablet Take 1 tablet by mouth daily.     No facility-administered medications prior to visit.    Review of Systems;  Patient denies headache, fevers, malaise, unintentional weight loss, skin rash, eye pain, sinus congestion and sinus pain, sore throat, dysphagia,  hemoptysis , cough, dyspnea, wheezing, chest pain, palpitations, orthopnea, edema, abdominal pain, nausea, melena, diarrhea, constipation, flank pain, dysuria, hematuria, urinary  Frequency, nocturia, numbness, tingling, seizures,  Focal weakness, Loss of consciousness,  Tremor, insomnia, depression, anxiety, and suicidal ideation.      Objective:  BP 120/70 (BP Location: Left Arm, Patient Position: Sitting, Cuff Size: Normal)   Pulse 71   Temp 97.7 F (36.5 C) (Oral)   Resp 14   Ht 5' 11"  (1.803 m)   Wt 171 lb 3.2 oz (77.7 kg)   SpO2 94%   BMI 23.88 kg/m   BP Readings from Last 3 Encounters:  02/01/22 120/70  10/19/21 118/68  07/21/21 (!) 112/48    Wt Readings from Last 3 Encounters:  02/01/22 171 lb 3.2 oz (77.7 kg)  10/19/21 173 lb 12.8 oz (78.8 kg)  07/21/21 172  lb (78 kg)    General appearance: alert, cooperative and appears stated age Ears: normal TM's and external ear canals both ears Throat: lips, mucosa, and tongue normal; teeth and gums normal Neck: no adenopathy, no carotid bruit, supple, symmetrical, trachea midline and thyroid not enlarged, symmetric, no tenderness/mass/nodules Back: symmetric, no curvature. ROM normal. No CVA tenderness. Lungs: clear to auscultation bilaterally Heart: regular rate and rhythm, S1, S2 normal, no murmur, click, rub or gallop Abdomen: soft, non-tender; bowel sounds normal; no masses,  no organomegaly Pulses: 2+ and symmetric Skin: Skin color, texture, turgor normal. No rashes or lesions Lymph nodes: Cervical, supraclavicular, and axillary nodes normal. MMSE:  29/30  (done today) Foot exam:  Nails are well trimmed,  No callouses,  Sensation intact to microfilament   Lab Results  Component Value Date   HGBA1C 7.3 (A) 02/01/2022   HGBA1C 7.6 (A) 10/19/2021   HGBA1C 7.2 (H) 06/19/2021    Lab Results  Component Value Date   CREATININE 1.27 10/19/2021   CREATININE 0.97 06/29/2021   CREATININE 0.63 06/19/2021    Lab Results  Component Value Date   WBC 6.8 06/29/2021   HGB 13.3 06/29/2021   HCT 41.1 06/29/2021   PLT 205.0 06/29/2021   GLUCOSE 98 10/19/2021   CHOL 116 10/19/2021   TRIG 130.0 10/19/2021   HDL 52.20 10/19/2021   LDLDIRECT 48.0 10/19/2021   LDLCALC 38 10/19/2021   ALT 25 10/19/2021   AST 26 10/19/2021   NA 139 10/19/2021   K 4.1 10/19/2021   CL 104 10/19/2021   CREATININE 1.27 10/19/2021   BUN 23 10/19/2021   CO2 26 10/19/2021   TSH 0.65 08/05/2021   PSA 0.42 04/03/2018   HGBA1C 7.3 (A) 02/01/2022   MICROALBUR 0.8 06/29/2021    ECHOCARDIOGRAM COMPLETE  Result Date: 06/19/2021    ECHOCARDIOGRAM REPORT   Patient Name:   Brett [name] Date of Exam: 06/19/2021 Medical Rec #:  [id number]  Height:       71.0 in Accession #:    [id number] Weight:       180.0 lb Date of Birth:   1940/08/18 BSA:          2.016 m Patient Age:    47 years   BP:           112/69 mmHg Patient Gender: M          HR:           53 bpm. Exam Location:  [facility] Procedure: 2D Echo, Cardiac Doppler and Color Doppler Indications:     Syncope R55  History:         Patient has no prior history of Echocardiogram examinations.                  Previous Myocardial Infarction; Risk Factors:Diabetes and  Hypertension.  Sonographer:     Sherrie Sport Referring Phys:  [id number] AMY N COX Diagnosing Phys: Ida Rogue MD IMPRESSIONS  1. Left ventricular ejection fraction, by estimation, is 60 to 65%. The left ventricle has normal function. The left ventricle has no regional wall motion abnormalities. Left ventricular diastolic parameters are consistent with Grade I diastolic dysfunction (impaired relaxation).  2. Right ventricular systolic function is normal. The right ventricular size is normal.  3. Left atrial size was mildly dilated.  4. The mitral valve is normal in structure. Mild mitral valve regurgitation. No evidence of mitral stenosis.  5. The aortic valve is normal in structure. Aortic valve regurgitation is not visualized. Aortic valve sclerosis/calcification is present, without any evidence of aortic stenosis.  6. The inferior vena cava is normal in size with greater than 50% respiratory variability, suggesting right atrial pressure of 3 mmHg. FINDINGS  Left Ventricle: Left ventricular ejection fraction, by estimation, is 60 to 65%. The left ventricle has normal function. The left ventricle has no regional wall motion abnormalities. The left ventricular internal cavity size was normal in size. There is  no left ventricular hypertrophy. Left ventricular diastolic parameters are consistent with Grade I diastolic dysfunction (impaired relaxation). Right Ventricle: The right ventricular size is normal. No increase in right ventricular wall thickness. Right ventricular systolic function is normal. Left Atrium:  Left atrial size was mildly dilated. Right Atrium: Right atrial size was normal in size. Pericardium: There is no evidence of pericardial effusion. Mitral Valve: The mitral valve is normal in structure. Mild mitral valve regurgitation. No evidence of mitral valve stenosis. MV peak gradient, 3.6 mmHg. The mean mitral valve gradient is 1.0 mmHg. Tricuspid Valve: The tricuspid valve is normal in structure. Tricuspid valve regurgitation is not demonstrated. No evidence of tricuspid stenosis. Aortic Valve: The aortic valve is normal in structure. Aortic valve regurgitation is not visualized. Aortic valve sclerosis/calcification is present, without any evidence of aortic stenosis. Aortic valve mean gradient measures 2.0 mmHg. Aortic valve peak  gradient measures 4.4 mmHg. Aortic valve area, by VTI measures 2.24 cm. Pulmonic Valve: The pulmonic valve was normal in structure. Pulmonic valve regurgitation is not visualized. No evidence of pulmonic stenosis. Aorta: The aortic root is normal in size and structure. Venous: The inferior vena cava is normal in size with greater than 50% respiratory variability, suggesting right atrial pressure of 3 mmHg. IAS/Shunts: No atrial level shunt detected by color flow Doppler.  LEFT VENTRICLE PLAX 2D LVIDd:         4.60 cm   Diastology LVIDs:         3.00 cm   LV e' medial:    7.62 cm/s LV PW:         1.00 cm   LV E/e' medial:  7.4 LV IVS:        1.00 cm   LV e' lateral:   12.50 cm/s LVOT diam:     2.00 cm   LV E/e' lateral: 4.5 LV SV:         47 LV SV Index:   23 LVOT Area:     3.14 cm  RIGHT VENTRICLE RV Basal diam:  3.40 cm RV S prime:     9.79 cm/s TAPSE (M-mode): 1.6 cm LEFT ATRIUM           Index        RIGHT ATRIUM           Index LA diam:  4.50 cm 2.23 cm/m   RA Area:     21.30 cm LA Vol (A2C): 40.0 ml 19.84 ml/m  RA Volume:   66.50 ml  32.98 ml/m LA Vol (A4C): 59.4 ml 29.46 ml/m  AORTIC VALVE                    PULMONIC VALVE AV Area (Vmax):    2.13 cm     PV Vmax:         0.56 m/s AV Area (Vmean):   2.00 cm     PV Vmean:       39.800 cm/s AV Area (VTI):     2.24 cm     PV VTI:         0.124 m AV Vmax:           105.00 cm/s  PV Peak grad:   1.3 mmHg AV Vmean:          73.600 cm/s  PV Mean grad:   1.0 mmHg AV VTI:            0.209 m      RVOT Peak grad: 3 mmHg AV Peak Grad:      4.4 mmHg AV Mean Grad:      2.0 mmHg LVOT Vmax:         71.10 cm/s LVOT Vmean:        46.800 cm/s LVOT VTI:          0.149 m LVOT/AV VTI ratio: 0.71  AORTA Ao Root diam: 3.10 cm MITRAL VALVE               TRICUSPID VALVE MV Area (PHT): 1.84 cm    TR Peak grad:   14.6 mmHg MV Area VTI:   1.61 cm    TR Vmax:        191.00 cm/s MV Peak grad:  3.6 mmHg MV Mean grad:  1.0 mmHg    SHUNTS MV Vmax:       0.95 m/s    Systemic VTI:  0.15 m MV Vmean:      46.4 cm/s   Systemic Diam: 2.00 cm MV Decel Time: 413 msec    Pulmonic VTI:  0.158 m MV E velocity: 56.10 cm/s MV A velocity: 78.80 cm/s MV E/A ratio:  0.71 Ida Rogue MD Electronically signed by Ida Rogue MD Signature Date/Time: 06/19/2021/6:02:52 PM    Final     Assessment & Plan:   Problem List Items Addressed This Visit     Aortic atherosclerosis [facility])    Reviewed findings of prior CT scan today..  Patient is tolerating high potency statin therapy       Depression with anxiety    He denies  anhedonia, fatigue, and other negative symptoms since adding wellbutrin       Diabetes mellitus with retinopathy ([place])    Improving a1c but not at goal of < 7.0  Yet.  Asked to increase activity to walking daily,  And reduce the starches in diet to 2 servings per day.   Continue asa and lipitor ,  Jardiance  Lab Results  Component Value Date   HGBA1C 7.3 (A) 02/01/2022   Lab Results  Component Value Date   LABMICR See below: 01/30/2020   LABMICR See below: 01/02/2020   MICROALBUR 0.8 06/29/2021   MICROALBUR 0.7 07/09/2020           Hyperlipidemia LDL goal <70   Relevant Orders   Lipid panel  Direct LDL   Hypothyroidism -  Primary   Relevant Orders   TSH   Mild cognitive impairment with memory loss    MMSE was 29/30 today.   workup deferred by patient at this point      Orthostatic hypotension    Resolved with suspension of tamsulosin      Other Visit Diagnoses     Diabetes mellitus without complication ([place])       Relevant Orders   POCT HgB A1C (Completed)   Comprehensive metabolic panel       I spent a total of  28  minutes with this patient in a face to face visit on the date of this encounter reviewing the last office visit with me , his most recent with patient's cardiologist   patient's diet and activity level,   most recent imaging study ,   and post visit ordering of testing and therapeutics.    Follow-up: Return in about 3 months (around 05/04/2022).   Crecencio Mc, MD

## 2022-02-01 NOTE — Patient Instructions (Addendum)
You need to exercise daily and take fewer naps. Certainly no nap after breakfast   Afternoon nap should be < 45 minutes    Exercise every day FOR 30 MINUTES.   preferably in the morning    Diet should include more green salads ( times daily)  and less starches.   Eat fish 2 times per week   SINCE YOU ARE TRAVELLING INTERNATIONALLY,  GET YOUR FLU VACCINE EARLY  TAKE THE AZITHROMYCIN WITH YOU ON YOUR TRIP

## 2022-02-02 DIAGNOSIS — G3184 Mild cognitive impairment, so stated: Secondary | ICD-10-CM | POA: Insufficient documentation

## 2022-02-02 LAB — COMPREHENSIVE METABOLIC PANEL
ALT: 32 U/L (ref 0–53)
AST: 27 U/L (ref 0–37)
Albumin: 4.6 g/dL (ref 3.5–5.2)
Alkaline Phosphatase: 57 U/L (ref 39–117)
BUN: 22 mg/dL (ref 6–23)
CO2: 25 mEq/L (ref 19–32)
Calcium: 9.9 mg/dL (ref 8.4–10.5)
Chloride: 107 mEq/L (ref 96–112)
Creatinine, Ser: 1.1 mg/dL (ref 0.40–1.50)
GFR: 64.14 mL/min (ref >60.00)
Glucose, Bld: 122 mg/dL — ABNORMAL HIGH (ref 70–99)
Potassium: 3.9 mEq/L (ref 3.5–5.1)
Sodium: 144 mEq/L (ref 135–145)
Total Bilirubin: 0.8 mg/dL (ref 0.2–1.2)
Total Protein: 7.4 g/dL (ref 6.0–8.3)

## 2022-02-02 LAB — LIPID PANEL
Cholesterol: 128 mg/dL (ref 0–200)
HDL: 57.2 mg/dL (ref >39.00)
LDL Cholesterol: 42 mg/dL (ref 0–99)
NonHDL: 70.93
Total CHOL/HDL Ratio: 2
Triglycerides: 144 mg/dL (ref 0.0–149.0)
VLDL: 28.8 mg/dL (ref 0.0–40.0)

## 2022-02-02 LAB — LDL CHOLESTEROL, DIRECT: Direct LDL: 49 mg/dL

## 2022-02-02 LAB — TSH: TSH: 0.6 u[IU]/mL (ref 0.35–5.50)

## 2022-02-02 NOTE — Assessment & Plan Note (Signed)
Improving a1c but not at goal of < 7.0  Yet.  Asked to increase activity to walking daily,  And reduce the starches in diet to 2 servings per day.   Continue asa and lipitor ,  Jardiance  Lab Results  Component Value Date   HGBA1C 7.3 (A) 02/01/2022   Lab Results  Component Value Date   LABMICR See below: 01/30/2020   LABMICR See below: 01/02/2020   MICROALBUR 0.8 06/29/2021   MICROALBUR 0.7 07/09/2020

## 2022-02-02 NOTE — Assessment & Plan Note (Signed)
MMSE was 29/30 today.   workup deferred by patient at this point

## 2022-02-02 NOTE — Assessment & Plan Note (Signed)
Resolved with suspension of tamsulosin

## 2022-02-02 NOTE — Assessment & Plan Note (Signed)
Reviewed findings of prior CT scan today..  Patient is tolerating high potency statin therapy  

## 2022-02-02 NOTE — Assessment & Plan Note (Signed)
He denies  anhedonia, fatigue, and other negative symptoms since adding wellbutrin

## 2022-04-21 ENCOUNTER — Other Ambulatory Visit: Payer: Self-pay | Admitting: Internal Medicine

## 2022-05-13 ENCOUNTER — Ambulatory Visit (INDEPENDENT_AMBULATORY_CARE_PROVIDER_SITE_OTHER): Payer: PPO | Admitting: Family

## 2022-05-13 ENCOUNTER — Encounter: Payer: Self-pay | Admitting: Family

## 2022-05-13 VITALS — BP 112/72 | HR 70 | Temp 97.7°F | Ht 71.0 in | Wt 176.2 lb

## 2022-05-13 DIAGNOSIS — J0110 Acute frontal sinusitis, unspecified: Secondary | ICD-10-CM | POA: Diagnosis not present

## 2022-05-13 DIAGNOSIS — Z20822 Contact with and (suspected) exposure to covid-19: Secondary | ICD-10-CM | POA: Insufficient documentation

## 2022-05-13 MED ORDER — AMOXICILLIN-POT CLAVULANATE 875-125 MG PO TABS: 1.0000 | ORAL_TABLET | Freq: Two times a day (BID) | ORAL | 0 refills | Status: DC

## 2022-05-13 NOTE — Assessment & Plan Note (Addendum)
Prescription given for augmentin 875/125 mg po bid for ten days. Pt to continue tylenol/ibuprofen prn sinus pain. Continue with humidifier prn and steam showers recommended as well. instructed If no symptom improvement in 48 hours please f/u ? ?

## 2022-05-13 NOTE — Patient Instructions (Signed)
  Antibiotic sent to preferred pharmacy.   Please increase oral fluids, steamy hot shower/humidifier prn.  Please follow up if no improvement in 2-3 days.   It was a pleasure seeing you today! Please do not hesitate to reach out with any questions and or concerns.  Regards,   Mona Ayars   

## 2022-05-13 NOTE — Progress Notes (Signed)
Established Patient Office Visit  Subjective:  Patient ID: Brett [name], male    DOB: 11-22-1940  Age: 81 y.o. MRN: [id number]  CC:  Chief Complaint  Patient presents with   Sinusitis    HPI Brett [name] is here today with concerns.   Started with sinus symptoms five days ago.  C/o headache, sinus pressure above the eyes.  No sore throat or ear pain. No chest congestion or cough.  Did not test for covid.  Does have slight sinus headache.  No fever or chills.    Past Medical History:  Diagnosis Date   Allergy    resolved after CABG   Calcific Achilles tendonitis    Calculus of ureter    Coronary artery disease    Diabetes mellitus    H/O renal calculi 2012   Hematuria, unspecified    Hyperlipidemia    Hypertension    Impotence of organic origin    Myocardial infarction [facility])    Peyronie's disease    Pulmonary nodule seen on imaging study 2012   Sleep difficulties    Swelling, mass, or lump in chest    Unspecified hypothyroidism     Past Surgical History:  Procedure Laterality Date   CARDIAC CATHETERIZATION  2001   [facility] x3 stent   COLONOSCOPY     COLONOSCOPY WITH PROPOFOL N/A 01/24/2017   Procedure: COLONOSCOPY WITH PROPOFOL;  Surgeon: Manya Silvas, MD;  Location: [facility] ENDOSCOPY;  Service: Endoscopy;  Laterality: N/A;   CORONARY ARTERY BYPASS GRAFT  2006   4 vessel, after 3 or 4 stents    CORONARY ARTERY BYPASS GRAFT     CYSTOSCOPY W/ RETROGRADES  01/18/2020   Procedure: CYSTOSCOPY WITH RETROGRADE PYELOGRAM;  Surgeon: Billey Co, MD;  Location: [facility] ORS;  Service: Urology;;   CYSTOSCOPY/URETEROSCOPY/HOLMIUM LASER/STENT PLACEMENT Right 01/18/2020   Procedure: CYSTOSCOPY/URETEROSCOPY/HOLMIUM LASER/STENT PLACEMENT;  Surgeon: Billey Co, MD;  Location: [facility] ORS;  Service: Urology;  Laterality: Right;   OSTECTOMY CALCANEUS Left    PENILE PROSTHESIS IMPLANT      Family History  Problem Relation Age of Onset   Hyperlipidemia Mother     AAA (abdominal aortic aneurysm) Mother    Heart disease Father 48    Social History   Socioeconomic History   Marital status: Married    Spouse name: Audelia Acton now deceased   Number of children: Not on file   Years of education: Not on file   Highest education level: Not on file  Occupational History    Employer: retired  Tobacco Use   Smoking status: Former    Types: Cigars    Quit date: 10/04/1981    Years since quitting: 40.6   Smokeless tobacco: Never  Vaping Use   Vaping Use: Never used  Substance and Sexual Activity   Alcohol use: Yes    Alcohol/week: 1.0 - 2.0 standard drink of alcohol    Types: 1 - 2 Cans of beer per week    Comment: everyday   Drug use: No   Sexual activity: Not Currently  Other Topics Concern   Not on file  Social History Narrative   Not on file   Social Determinants of Health   Financial Resource Strain: Low Risk  (08/11/2021)   Overall Financial Resource Strain (CARDIA)    Difficulty of Paying Living Expenses: Not very hard  Food Insecurity: No Food Insecurity (05/26/2021)   Hunger Vital Sign    Worried About Running Out of Food in the Last  Year: Never true    [place] in the Last Year: Never true  Transportation Needs: No Transportation Needs (05/26/2021)   PRAPARE - Hydrologist (Medical): No    Lack of Transportation (Non-Medical): No  Physical Activity: Sufficiently Active (05/26/2021)   Exercise Vital Sign    Days of Exercise per Week: 3 days    Minutes of Exercise per Session: 50 min  Stress: No Stress Concern Present (05/26/2021)   [place]    Feeling of Stress : Not at all  Social Connections: Unknown (05/26/2021)   Social Connection and Isolation Panel [NHANES]    Frequency of Communication with Friends and Family: Not on file    Frequency of Social Gatherings with Friends and Family: Not on file    Attends Religious  Services: Not on file    Active Member of Clubs or Organizations: Not on file    Attends Archivist Meetings: Not on file    Marital Status: Married  Intimate Partner Violence: Not At Risk (05/26/2021)   Humiliation, Afraid, Rape, and Kick questionnaire    Fear of Current or Ex-Partner: No    Emotionally Abused: No    Physically Abused: No    Sexually Abused: No    Outpatient Medications Prior to Visit  Medication Sig Dispense Refill   acetaminophen (TYLENOL) 500 MG tablet Take 1-2 tablets (500-1,000 mg total) by mouth in the morning, at noon, and at bedtime.     aspirin EC 81 MG tablet Take 81 mg by mouth daily.     atorvastatin (LIPITOR) 80 MG tablet TAKE 1 TABLET BY MOUTH EVERY DAY 90 tablet 2   Cholecalciferol (VITAMIN D) 50 MCG (2000 UT) tablet Take 2,000 Units by mouth daily.     DULoxetine (CYMBALTA) 30 MG capsule TAKE 1 CAPSULE BY MOUTH EVERY DAY 90 capsule 1   empagliflozin (JARDIANCE) 25 MG TABS tablet Take 1 tablet (25 mg total) by mouth daily before breakfast. 90 tablet 1   ezetimibe (ZETIA) 10 MG tablet TAKE 1 TABLET BY MOUTH EVERY DAY 90 tablet 2   FREESTYLE LITE test strip CHECK BLOOD SUGAR DAILY AS DIRECTED 100 strip 5   glucose monitoring kit (FREESTYLE) monitoring kit 1 each by Does not apply route as needed for other. For use daily to monitor diabetes.  Please include lancets .  Test once daily   E11.9 1 each 3   Lancets (FREESTYLE) lancets Use as instructed 100 each 12   levothyroxine (SYNTHROID) 100 MCG tablet Take 1 tablet (100 mcg total) by mouth daily. 30 minutes before food in am 90 tablet 3   Multiple Vitamin (MULTIVITAMIN) tablet Take 1 tablet by mouth daily.     azithromycin (ZITHROMAX) 500 MG tablet Take 1 tablet (500 mg total) by mouth daily. (Patient not taking: Reported on 05/13/2022) 7 tablet 0   No facility-administered medications prior to visit.    Allergies  Allergen Reactions   Metformin And Related     Low appetite     Simvastatin  Other (See Comments)   Eszopiclone Rash        Objective:    Physical Exam Constitutional:      General: He is awake. He is not in acute distress.    Appearance: Normal appearance. He is not ill-appearing.  HENT:     Right Ear: Tympanic membrane normal.     Left Ear: Tympanic membrane normal.  Nose:     Right Turbinates: Not enlarged or swollen.     Left Turbinates: Not enlarged or swollen.     Right Sinus: Frontal sinus tenderness present. No maxillary sinus tenderness.     Left Sinus: Frontal sinus tenderness present. No maxillary sinus tenderness.     Mouth/Throat:     Mouth: Mucous membranes are moist.     Pharynx: No pharyngeal swelling, oropharyngeal exudate or posterior oropharyngeal erythema.  Eyes:     Extraocular Movements: Extraocular movements intact.     Pupils: Pupils are equal, round, and reactive to light.  Cardiovascular:     Rate and Rhythm: Normal rate and regular rhythm.  Pulmonary:     Effort: Pulmonary effort is normal.     Breath sounds: Normal breath sounds. No wheezing.  Neurological:     Mental Status: He is alert.     BP 112/72   Pulse 70   Temp 97.7 F (36.5 C)   Ht _0  (1.803 m)   Wt 176 lb 3.2 oz (79.9 kg)   SpO2 95%   BMI 24.57 kg/m  Wt Readings from Last 3 Encounters:  05/13/22 176 lb 3.2 oz (79.9 kg)  02/01/22 171 lb 3.2 oz (77.7 kg)  10/19/21 173 lb 12.8 oz (78.8 kg)     Health Maintenance Due  Topic Date Due   COLONOSCOPY (Pts 45-7yr Insurance coverage will need to be confirmed)  01/24/2022   COVID-19 Vaccine (6 - 2023-24 season) 02/05/2022   Medicare Annual Wellness (AWV)  05/26/2022    There are no preventive care reminders to display for this patient.  Lab Results  Component Value Date   TSH 0.60 02/01/2022   Lab Results  Component Value Date   WBC 6.8 06/29/2021   HGB 13.3 06/29/2021   HCT 41.1 06/29/2021   MCV 93.7 06/29/2021   PLT 205.0 06/29/2021   Lab Results  Component Value Date   NA 144  02/01/2022   K 3.9 02/01/2022   CO2 25 02/01/2022   GLUCOSE 122 (H) 02/01/2022   BUN 22 02/01/2022   CREATININE 1.10 02/01/2022   BILITOT 0.8 02/01/2022   ALKPHOS 57 02/01/2022   AST 27 02/01/2022   ALT 32 02/01/2022   PROT 7.4 02/01/2022   ALBUMIN 4.6 02/01/2022   CALCIUM 9.9 02/01/2022   ANIONGAP 6 06/19/2021   GFR 64.14 02/01/2022   Lab Results  Component Value Date   HGBA1C 7.3 (A) 02/01/2022      Assessment & Plan:   Problem List Items Addressed This Visit       Respiratory   Acute non-recurrent frontal sinusitis     Prescription given for augmentin 875/125 mg po bid for ten days. Pt to continue tylenol/ibuprofen prn sinus pain. Continue with humidifier prn and steam showers recommended as well. instructed If no symptom improvement in 48 hours please f/u       Relevant Medications   amoxicillin-clavulanate (AUGMENTIN) 875-125 MG tablet     Other   Suspected COVID-19 virus infection - Primary    Covid tested in office and was negative.        Meds ordered this encounter  Medications   amoxicillin-clavulanate (AUGMENTIN) 875-125 MG tablet    Sig: Take 1 tablet by mouth 2 (two) times daily.    Dispense:  20 tablet    Refill:  0    Order Specific Question:   Supervising Provider    Answer:   BEDSOLE, AMY E [[id number]]    Follow-up:  No follow-ups on file.    Eugenia Pancoast, FNP

## 2022-05-13 NOTE — Assessment & Plan Note (Signed)
Covid tested in office and was negative.

## 2022-05-21 ENCOUNTER — Telehealth: Payer: Self-pay | Admitting: Internal Medicine

## 2022-05-21 NOTE — Telephone Encounter (Signed)
Pt called the office returning a phone call, pt was given the phone number to [facility] to get scheduled for AWV.

## 2022-05-21 NOTE — Telephone Encounter (Signed)
Copied from [place] [id number]. Topic: Medicare AWV >> May 21, 2022 10:15 AM Devoria Glassing wrote: Reason for CRM: Left message for patient to schedule Annual Wellness Visit.  Please schedule with Nurse Health Advisor Madelyn Brunner, LPN at [facility]. This appt can be telephone or office visit.  Please call [phone] ask for [facility]

## 2022-07-20 ENCOUNTER — Other Ambulatory Visit: Payer: Self-pay | Admitting: Cardiovascular Disease

## 2022-07-21 NOTE — Telephone Encounter (Signed)
Please schedule 12 month F/U appointment for 90 day refills. Thank you! 

## 2022-07-22 NOTE — Telephone Encounter (Signed)
Pt is scheduled on 3/4

## 2022-07-23 ENCOUNTER — Other Ambulatory Visit: Payer: Self-pay | Admitting: Cardiovascular Disease

## 2022-08-06 ENCOUNTER — Telehealth: Payer: Self-pay | Admitting: Internal Medicine

## 2022-08-06 NOTE — Telephone Encounter (Signed)
Contacted Brett [name] to schedule their annual wellness visit. Appointment made for 08/12/2022.  Thank you,  [place] Direct dial  [phone]

## 2022-08-07 NOTE — Progress Notes (Unsigned)
Cardiology Office Note  Date:  08/09/2022   ID:  Brett [name], Brett [name] 09-11-1940, MRN [id number]  PCP:  Crecencio Mc, MD   Chief Complaint  Patient presents with   12 month follow up     "Doing well." Medications reviewed by the patient verbally.     HPI:  [name] is a very pleasant 82 year old gentleman with history of  HTN,   DM II, HBA1C 6.7,  Hyperlipidemia,  CAD,  CABG x4 at [facility]   in 2006,  previous stents Depression/insomnia Who presents for routine follow-up of his coronary artery disease, hx of CABG  LOV with myself February 2023 In follow-up today reports that he feels well with no complaints Walks 5x a week, 45 min Denies chest pain or shortness of breath on exertion  Blood pressure low today, reports he is asymptomatic Currently not on blood pressure medications Low fluid intake today  EKG personally reviewed by myself on todays visit Normal sinus rhythm rate 58 bpm no significant ST abnormality, T wave abnormality V3 through V6, seen previously  Other past medical history reviewed Admission to the hospital January 2023 for syncope 7 PM stood up out of her recliner to go to another room when wife appreciated a loud noise, found him on the ground.  Took him 15 seconds to recover, he was groggy, she tried several times to sit him up, EMTs called.  On their evaluation he was mentating better but on further observation had recurrent near syncope with low blood pressure and bradycardia.  Orthostatics positive with EMTs and in the emergency room as documented in H&P, 30-40 point drop with standing Wife reports he drinks soda, not much of other beverages, at bedtime taking his Micardis and Flomax at 4 PM, syncope at 7 PM. on Jardiance several weeks heart rate quickly down to high 40s and back up to low 50s  Markedly orthostatic in the field and in the ER on arrival Likely polypharmacy including Flomax, metoprolol, Micardis, Jardiance, hypokalemia -Also  in the setting of bradycardia on metoprolol --Recommendation to hold metoprolol, Micardis, Flomax Recommend to increase fluid intake not just soda --We have ordered a ZIO monitor given his bradycardia  Echo reviewed, ejection fraction 60 to 65%  Zio monitor Patient had a min HR of 39 bpm, max HR of 152 bpm, and avg HR of 56 bpm. Predominant underlying rhythm was Sinus Rhythm. First Degree AV Block was present. 3 Supraventricular Tachycardia runs occurred, the run with the fastest interval lasting 5 beats  with a max rate of 152 bpm, the longest lasting 5 beats with an avg rate of 117 bpm. Isolated SVEs were rare (<1.0%), SVE Couplets were rare (<1.0%), and SVE Triplets were rare (<1.0%). Isolated VEs were rare (<1.0%), and no VE Couplets or VE Triplets  were present.  CT scan abdomen pelvis mild to moderate aortic atherosclerosis No aorta dilation concerning for aneurysm  PMH:   has a past medical history of Allergy, Calcific Achilles tendonitis, Calculus of ureter, Coronary artery disease, Diabetes mellitus, H/O renal calculi (2012), Hematuria, unspecified, Hyperlipidemia, Hypertension, Impotence of organic origin, Myocardial infarction [facility]), Peyronie's disease, Pulmonary nodule seen on imaging study (2012), Sleep difficulties, Swelling, mass, or lump in chest, and Unspecified hypothyroidism.  PSH:    Past Surgical History:  Procedure Laterality Date   CARDIAC CATHETERIZATION  2001   [facility] x3 stent   COLONOSCOPY     COLONOSCOPY WITH PROPOFOL N/A 01/24/2017   Procedure: COLONOSCOPY WITH PROPOFOL;  Surgeon: Manya Silvas, MD;  Location: [facility] ENDOSCOPY;  Service: Endoscopy;  Laterality: N/A;   CORONARY ARTERY BYPASS GRAFT  2006   4 vessel, after 3 or 4 stents    CORONARY ARTERY BYPASS GRAFT     CYSTOSCOPY W/ RETROGRADES  01/18/2020   Procedure: CYSTOSCOPY WITH RETROGRADE PYELOGRAM;  Surgeon: Billey Co, MD;  Location: [facility] ORS;  Service: Urology;;    CYSTOSCOPY/URETEROSCOPY/HOLMIUM LASER/STENT PLACEMENT Right 01/18/2020   Procedure: CYSTOSCOPY/URETEROSCOPY/HOLMIUM LASER/STENT PLACEMENT;  Surgeon: Billey Co, MD;  Location: [facility] ORS;  Service: Urology;  Laterality: Right;   OSTECTOMY CALCANEUS Left    PENILE PROSTHESIS IMPLANT      Current Outpatient Medications  Medication Sig Dispense Refill   acetaminophen (TYLENOL) 500 MG tablet Take 1-2 tablets (500-1,000 mg total) by mouth in the morning, at noon, and at bedtime.     aspirin EC 81 MG tablet Take 81 mg by mouth daily.     azithromycin (ZITHROMAX) 500 MG tablet Take 1 tablet (500 mg total) by mouth daily. 7 tablet 0   Cholecalciferol (VITAMIN D) 50 MCG (2000 UT) tablet Take 2,000 Units by mouth daily.     DULoxetine (CYMBALTA) 30 MG capsule TAKE 1 CAPSULE BY MOUTH EVERY DAY 90 capsule 1   empagliflozin (JARDIANCE) 25 MG TABS tablet Take 1 tablet (25 mg total) by mouth daily before breakfast. 90 tablet 1   ezetimibe (ZETIA) 10 MG tablet Take 1 tablet (10 mg total) by mouth daily. Additional refill only at office visit 30 tablet 0   FREESTYLE LITE test strip CHECK BLOOD SUGAR DAILY AS DIRECTED 100 strip 5   glucose monitoring kit (FREESTYLE) monitoring kit 1 each by Does not apply route as needed for other. For use daily to monitor diabetes.  Please include lancets .  Test once daily   E11.9 1 each 3   Lancets (FREESTYLE) lancets Use as instructed 100 each 12   levothyroxine (SYNTHROID) 100 MCG tablet Take 1 tablet (100 mcg total) by mouth daily. 30 minutes before food in am 90 tablet 3   Multiple Vitamin (MULTIVITAMIN) tablet Take 1 tablet by mouth daily.     amoxicillin-clavulanate (AUGMENTIN) 875-125 MG tablet Take 1 tablet by mouth 2 (two) times daily. (Patient not taking: Reported on 08/09/2022) 20 tablet 0   atorvastatin (LIPITOR) 80 MG tablet TAKE 1 TABLET BY MOUTH EVERY DAY (Patient not taking: Reported on 08/09/2022) 90 tablet 0   No current facility-administered medications for  this visit.    Allergies:   Metformin and related, Simvastatin, and Eszopiclone   Social History:  The patient  reports that he quit smoking about 40 years ago. His smoking use included cigars. He has never used smokeless tobacco. He reports current alcohol use of about 1.0 - 2.0 standard drink of alcohol per week. He reports that he does not use drugs.   Family History:   family history includes AAA (abdominal aortic aneurysm) in his mother; Heart disease (age of onset: 38) in his father; Hyperlipidemia in his mother.    Review of Systems: Review of Systems  Constitutional: Negative.   HENT: Negative.    Respiratory: Negative.    Cardiovascular: Negative.   Gastrointestinal: Negative.   Musculoskeletal: Negative.   Neurological: Negative.   Psychiatric/Behavioral: Negative.    All other systems reviewed and are negative.   PHYSICAL EXAM: VS:  BP 104/60 (BP Location: Left Arm, Patient Position: Sitting, Cuff Size: Normal)   Pulse (!) 58   Ht '5\' 11"'$  (1.803  m)   Wt 175 lb 2 oz (79.4 kg)   SpO2 95%   BMI 24.42 kg/m  , BMI Body mass index is 24.42 kg/m. Constitutional:  oriented to person, place, and time. No distress.  HENT:  Head: Grossly normal Eyes:  no discharge. No scleral icterus.  Neck: No JVD, no carotid bruits  Cardiovascular: Regular rate and rhythm, no murmurs appreciated Pulmonary/Chest: Clear to auscultation bilaterally, no wheezes or rails Abdominal: Soft.  no distension.  no tenderness.  Musculoskeletal: Normal range of motion Neurological:  normal muscle tone. Coordination normal. No atrophy Skin: Skin warm and dry Psychiatric: normal affect, pleasant   Recent Labs: 02/01/2022: ALT 32; BUN 22; Creatinine, Ser 1.10; Potassium 3.9; Sodium 144; TSH 0.60    Lipid Panel Lab Results  Component Value Date   CHOL 128 02/01/2022   HDL 57.20 02/01/2022   LDLCALC 42 02/01/2022   TRIG 144.0 02/01/2022     Wt Readings from Last 3 Encounters:  08/09/22 175  lb 2 oz (79.4 kg)  05/13/22 176 lb 3.2 oz (79.9 kg)  02/01/22 171 lb 3.2 oz (77.7 kg)     ASSESSMENT AND PLAN:  Coronary artery disease involving coronary bypass graft of native heart with angina pectoris ([place]) - Plan: EKG 12-Lead Currently with no symptoms of angina. No further workup at this time. Continue current medication regimen.  Orthostasis Blood pressure running low, not on blood pressure medication Recommend he increase his hydration  Diabetes mellitus without complication (HCC) Hemoglobin A1c 7 .3 on Jardiance 25  Hyperlipidemia LDL goal <70 Cholesterol at goal  Syncope Secondary to orthostasis Blood pressure low today, poor fluid intake, again recommended he increase his fluid intake   Total encounter time more than 30 minutes  Greater than 50% was spent in counseling and coordination of care with the patient   Orders Placed This Encounter  Procedures   EKG 12-Lead     Signed, Esmond Plants, M.D., Ph.D. 08/09/2022  [place], [place]

## 2022-08-09 ENCOUNTER — Ambulatory Visit: Payer: PPO | Attending: Cardiovascular Disease | Admitting: Cardiovascular Disease

## 2022-08-09 ENCOUNTER — Encounter: Payer: Self-pay | Admitting: Cardiovascular Disease

## 2022-08-09 VITALS — BP 104/60 | HR 58 | Ht 71.0 in | Wt 175.1 lb

## 2022-08-09 DIAGNOSIS — I25708 Atherosclerosis of coronary artery bypass graft(s), unspecified, with other forms of angina pectoris: Secondary | ICD-10-CM

## 2022-08-09 DIAGNOSIS — I152 Hypertension secondary to endocrine disorders: Secondary | ICD-10-CM | POA: Diagnosis not present

## 2022-08-09 DIAGNOSIS — I951 Orthostatic hypotension: Secondary | ICD-10-CM

## 2022-08-09 DIAGNOSIS — E785 Hyperlipidemia, unspecified: Secondary | ICD-10-CM | POA: Diagnosis not present

## 2022-08-09 DIAGNOSIS — I700 Atherosclerosis of aorta: Secondary | ICD-10-CM

## 2022-08-09 DIAGNOSIS — E1159 Type 2 diabetes mellitus with other circulatory complications: Secondary | ICD-10-CM | POA: Diagnosis not present

## 2022-08-09 DIAGNOSIS — E11311 Type 2 diabetes mellitus with unspecified diabetic retinopathy with macular edema: Secondary | ICD-10-CM

## 2022-08-09 DIAGNOSIS — E119 Type 2 diabetes mellitus without complications: Secondary | ICD-10-CM

## 2022-08-09 DIAGNOSIS — R55 Syncope and collapse: Secondary | ICD-10-CM

## 2022-08-09 NOTE — Patient Instructions (Signed)
Medication Instructions:  No changes  If you need a refill on your cardiac medications before your next appointment, please call your pharmacy.   Lab work: No new labs needed  Testing/Procedures: No new testing needed  Follow-Up: At [facility], you and your health needs are our priority.  As part of our continuing mission to provide you with exceptional heart care, we have created designated Provider Care Teams.  These Care Teams include your primary Cardiologist (physician) and Advanced Practice Providers (APPs -  Physician Assistants and Nurse Practitioners) who all work together to provide you with the care you need, when you need it.  You will need a follow up appointment in 12 months  Providers on your designated Care Team:   Christopher Berge, NP Ryan Dunn, PA-C Cadence Furth, PA-C  COVID-19 Vaccine Information can be found at: [name] For questions related to vaccine distribution or appointments, please email [email or web address] or call [phone].   

## 2022-08-12 ENCOUNTER — Ambulatory Visit (INDEPENDENT_AMBULATORY_CARE_PROVIDER_SITE_OTHER): Payer: PPO

## 2022-08-12 VITALS — Ht 71.0 in | Wt 175.0 lb

## 2022-08-12 DIAGNOSIS — Z0000 Encounter for general adult medical examination without abnormal findings: Secondary | ICD-10-CM | POA: Diagnosis not present

## 2022-08-12 NOTE — Progress Notes (Signed)
Subjective:   Brett [name] is a 82 y.o. male who presents for Medicare Annual/Subsequent preventive examination.  Review of Systems    No ROS.  Medicare Wellness Virtual Visit.  Visual/audio telehealth visit, UTA vital signs.   See social history for additional risk factors.   Cardiac Risk Factors include: advanced age (>32mn, >>68women);male gender     Objective:    Today's Vitals   08/12/22 1501  Weight: 175 lb (79.4 kg)  Height: '5\' 11"'$  (1.803 m)   Body mass index is 24.41 kg/m.     08/12/2022    3:03 PM 06/18/2021    8:55 PM 05/26/2021    2:07 PM 05/22/2020    4:33 PM 01/18/2020    8:55 AM 01/10/2020   12:15 PM 05/22/2019    9:25 AM  Advanced Directives  Does Patient Have a Medical Advance Directive? Yes No Yes Yes Yes Yes Yes  Type of AParamedicof [place]Living will  [place]Living will  H[place]Living will HSeven MileLiving will HWilmoreLiving will  Does patient want to make changes to medical advance directive? No - Patient declined  No - Patient declined No - Patient declined   No - Patient declined  Copy of HOrangein Chart? No - copy requested  No - copy requested  No - copy requested No - copy requested No - copy requested    Current Medications (verified) Outpatient Encounter Medications as of 08/12/2022  Medication Sig   acetaminophen (TYLENOL) 500 MG tablet Take 1-2 tablets (500-1,000 mg total) by mouth in the morning, at noon, and at bedtime.   amoxicillin-clavulanate (AUGMENTIN) 875-125 MG tablet Take 1 tablet by mouth 2 (two) times daily. (Patient not taking: Reported on 08/09/2022)   aspirin EC 81 MG tablet Take 81 mg by mouth daily.   atorvastatin (LIPITOR) 80 MG tablet TAKE 1 TABLET BY MOUTH EVERY DAY (Patient not taking: Reported on 08/09/2022)   azithromycin (ZITHROMAX) 500 MG tablet Take 1 tablet (500 mg total) by mouth daily.    Cholecalciferol (VITAMIN D) 50 MCG (2000 UT) tablet Take 2,000 Units by mouth daily.   DULoxetine (CYMBALTA) 30 MG capsule TAKE 1 CAPSULE BY MOUTH EVERY DAY   empagliflozin (JARDIANCE) 25 MG TABS tablet Take 1 tablet (25 mg total) by mouth daily before breakfast.   ezetimibe (ZETIA) 10 MG tablet Take 1 tablet (10 mg total) by mouth daily. Additional refill only at office visit   FREESTYLE LITE test strip CHECK BLOOD SUGAR DAILY AS DIRECTED   glucose monitoring kit (FREESTYLE) monitoring kit 1 each by Does not apply route as needed for other. For use daily to monitor diabetes.  Please include lancets .  Test once daily   E11.9   Lancets (FREESTYLE) lancets Use as instructed   levothyroxine (SYNTHROID) 100 MCG tablet Take 1 tablet (100 mcg total) by mouth daily. 30 minutes before food in am   Multiple Vitamin (MULTIVITAMIN) tablet Take 1 tablet by mouth daily.   No facility-administered encounter medications on file as of 08/12/2022.    Allergies (verified) Metformin and related, Simvastatin, and Eszopiclone   History: Past Medical History:  Diagnosis Date   Allergy    resolved after CABG   Calcific Achilles tendonitis    Calculus of ureter    Coronary artery disease    Diabetes mellitus    H/O renal calculi 2012   Hematuria, unspecified    Hyperlipidemia  Hypertension    Impotence of organic origin    Myocardial infarction [facility])    Peyronie's disease    Pulmonary nodule seen on imaging study 2012   Sleep difficulties    Swelling, mass, or lump in chest    Unspecified hypothyroidism    Past Surgical History:  Procedure Laterality Date   CARDIAC CATHETERIZATION  2001   [facility] x3 stent   COLONOSCOPY     COLONOSCOPY WITH PROPOFOL N/A 01/24/2017   Procedure: COLONOSCOPY WITH PROPOFOL;  Surgeon: Manya Silvas, MD;  Location: [facility] ENDOSCOPY;  Service: Endoscopy;  Laterality: N/A;   CORONARY ARTERY BYPASS GRAFT  2006   4 vessel, after 3 or 4 stents    CORONARY  ARTERY BYPASS GRAFT     CYSTOSCOPY W/ RETROGRADES  01/18/2020   Procedure: CYSTOSCOPY WITH RETROGRADE PYELOGRAM;  Surgeon: Billey Co, MD;  Location: [facility] ORS;  Service: Urology;;   CYSTOSCOPY/URETEROSCOPY/HOLMIUM LASER/STENT PLACEMENT Right 01/18/2020   Procedure: CYSTOSCOPY/URETEROSCOPY/HOLMIUM LASER/STENT PLACEMENT;  Surgeon: Billey Co, MD;  Location: [facility] ORS;  Service: Urology;  Laterality: Right;   OSTECTOMY CALCANEUS Left    PENILE PROSTHESIS IMPLANT     Family History  Problem Relation Age of Onset   Hyperlipidemia Mother    AAA (abdominal aortic aneurysm) Mother    Heart disease Father 75   Social History   Socioeconomic History   Marital status: Married    Spouse name: Audelia Acton now deceased   Number of children: Not on file   Years of education: Not on file   Highest education level: Not on file  Occupational History    Employer: retired  Tobacco Use   Smoking status: Former    Types: Cigars    Quit date: 10/04/1981    Years since quitting: 40.8   Smokeless tobacco: Never  Vaping Use   Vaping Use: Never used  Substance and Sexual Activity   Alcohol use: Yes    Alcohol/week: 1.0 - 2.0 standard drink of alcohol    Types: 1 - 2 Cans of beer per week    Comment: everyday   Drug use: No   Sexual activity: Not Currently  Other Topics Concern   Not on file  Social History Narrative   Not on file   Social Determinants of Health   Financial Resource Strain: Low Risk  (08/11/2021)   Overall Financial Resource Strain (CARDIA)    Difficulty of Paying Living Expenses: Not very hard  Food Insecurity: No Food Insecurity (08/12/2022)   Hunger Vital Sign    Worried About Running Out of Food in the Last Year: Never true    Ran Out of Food in the Last Year: Never true  Transportation Needs: No Transportation Needs (08/12/2022)   PRAPARE - Hydrologist (Medical): No    Lack of Transportation (Non-Medical): No  Physical Activity: Sufficiently  Active (08/12/2022)   Exercise Vital Sign    Days of Exercise per Week: 3 days    Minutes of Exercise per Session: 50 min  Stress: No Stress Concern Present (08/12/2022)   [place]    Feeling of Stress : Not at all  Social Connections: Unknown (05/26/2021)   Social Connection and Isolation Panel [NHANES]    Frequency of Communication with Friends and Family: Not on file    Frequency of Social Gatherings with Friends and Family: Not on file    Attends Religious Services: Not on file  Active Member of Clubs or Organizations: Not on file    Attends Archivist Meetings: Not on file    Marital Status: Married    Tobacco Counseling Counseling given: Not Answered   Clinical Intake:  Pre-visit preparation completed: Yes       Nutrition Risk Assessment: Has the patient had any N/V/D within the last 2 months?  No  Does the patient have any non-healing wounds?  No  Has the patient had any unintentional weight loss or weight gain?  No   Diabetes: Is the patient diabetic?  Yes  If diabetic, was a CBG obtained today?  No  Did the patient bring in their glucometer from home?  No  How often do you monitor your CBG's? daily.   Financial Strains and Diabetes Management: Are you having any financial strains with the device, your supplies or your medication? No .  Does the patient want to be seen by Chronic Care Management for management of their diabetes?  No  Would the patient like to be referred to a Nutritionist or for Diabetic Management?  No                 Activities of Daily Living    08/12/2022    3:03 PM  In your present state of health, do you have any difficulty performing the following activities:  Hearing? 0  Vision? 0  Difficulty concentrating or making decisions? 0  Walking or climbing stairs? 0  Dressing or bathing? 0  Doing errands, shopping? 0  Preparing Food and eating ? N   Using the Toilet? N  In the past six months, have you accidently leaked urine? N  Do you have problems with loss of bowel control? N  Managing your Medications? N  Managing your Finances? N  Housekeeping or managing your Housekeeping? N    Patient Care Team: Crecencio Mc, MD as PCP - General (Internal Medicine) Minna Merritts, MD as Consulting Physician (Cardiology)  Indicate any recent Medical Services you may have received from other than [facility] providers in the past year (date may be approximate).     Assessment:   This is a routine wellness examination for [place].  Hearing/Vision screen Hearing Screening - Comments:: Patient is able to hear conversational tones without difficulty.  No issues reported.   Vision Screening - Comments:: Followed by [facility] Wears corrective lenses when reading They have regular follow up with the ophthalmologist    Dietary issues and exercise activities discussed: Current Exercise Habits: Home exercise routine, Type of exercise: walking, Time (Minutes): 45, Frequency (Times/Week): 3, Weekly Exercise (Minutes/Week): 135, Intensity: Mild   Goals Addressed               This Visit's Progress     Patient Stated     Maintain Healthy Lifestyle (pt-stated)        Stay active Healthy diet Stay hydrated       Depression Screen    08/12/2022    3:03 PM 05/13/2022   11:25 AM 02/01/2022    3:32 PM 10/19/2021    4:14 PM 07/09/2021   11:44 AM 06/24/2021   10:09 AM 05/26/2021    2:30 PM  PHQ 2/9 Scores  PHQ - 2 Score 0 0 0 0 0 0 0  PHQ- 9 Score    0 0 0     Fall Risk    08/12/2022    3:02 PM 05/13/2022   11:24 AM 02/01/2022  3:32 PM 10/19/2021    4:13 PM 07/09/2021   11:44 AM  Fall Risk   Falls in the past year? 0 0 0 0 0  Number falls in past yr: 0 0 0    Injury with Fall? 0 0 0    Risk for fall due to :  No Fall Risks No Fall Risks No Fall Risks No Fall Risks  Follow up Falls evaluation completed;Falls prevention discussed  Falls evaluation completed Falls evaluation completed Falls evaluation completed Falls evaluation completed    FALL RISK PREVENTION PERTAINING TO THE HOME: Home free of loose throw rugs in walkways, pet beds, electrical cords, etc? Yes  Adequate lighting in your home to reduce risk of falls? Yes   ASSISTIVE DEVICES UTILIZED TO PREVENT FALLS: Life alert? No  Use of a cane, walker or w/c? No   TIMED UP AND GO: Was the test performed? No .   Cognitive Function:    05/11/2017    2:32 PM 05/07/2015    9:58 AM  MMSE - Mini Mental State Exam  Orientation to time 5 5  Orientation to Place 5 5  Registration 3 3  Attention/ Calculation 5 5  Recall 3 3  Language- name 2 objects 2 2  Language- repeat 1 1  Language- follow 3 step command 3 3  Language- read & follow direction 1 1  Write a sentence 1 1  Copy design 1 1  Total score 30 30        05/26/2021    2:32 PM 05/22/2019    9:17 AM 05/17/2018    9:26 AM 05/11/2016    3:19 PM  6CIT Screen  What Year? 0 points 0 points 0 points 0 points  What month? 0 points 0 points 0 points 0 points  What time? 0 points 0 points 0 points 0 points  Count back from 20 0 points 0 points 0 points 0 points  Months in reverse 0 points 0 points 0 points 0 points  Repeat phrase 0 points 0 points  0 points  Total Score 0 points 0 points  0 points    Immunizations Immunization History  Administered Date(s) Administered   Fluad Quad(high Dose 65+) 03/21/2020, 02/03/2022   Influenza Split 03/16/2013, 02/11/2015   Influenza Whole 02/14/2012   Influenza-Unspecified 02/05/2013, 02/11/2015, 02/27/2016, 02/20/2018, 02/20/2019, 02/05/2021, 02/19/2022   PFIZER(Purple Top)SARS-COV-2 Vaccination 06/12/2019, 07/03/2019, 01/20/2020, 09/30/2020   Pfizer Covid-19 Vaccine Bivalent Booster 60yr & up 02/05/2021   Pneumococcal Conjugate-13 03/16/2013   Pneumococcal Polysaccharide-23 07/08/2009, 02/02/2013, 06/25/2015   Tdap 02/28/2012, 02/02/2013, 03/16/2013    Zoster Recombinat (Shingrix) 04/06/2018, 08/29/2018    Screening Tests Health Maintenance  Topic Date Due   COLONOSCOPY (Pts [id number] coverage will need to be confirmed)  01/24/2022   Diabetic kidney evaluation - Urine ACR  06/29/2022   HEMOGLOBIN A1C  08/04/2022   COVID-19 Vaccine (6 - 2023-24 season) 08/28/2022 (Originally 02/05/2022)   Diabetic kidney evaluation - eGFR measurement  02/02/2023   FOOT EXAM  02/02/2023   DTaP/Tdap/Td (4 - Td or Tdap) 03/17/2023   OPHTHALMOLOGY EXAM  03/25/2023   Medicare Annual Wellness (AWV)  08/12/2023   Pneumonia Vaccine 6549Years old  Completed   INFLUENZA VACCINE  Completed   Zoster Vaccines- Shingrix  Completed   HPV VACCINES  Aged Out   Hepatitis C Screening  Discontinued    Health Maintenance  Health Maintenance Due  Topic Date Due   COLONOSCOPY (Pts [phone] coverage will need to be  confirmed)  01/24/2022   Diabetic kidney evaluation - Urine ACR  06/29/2022   HEMOGLOBIN A1C  08/04/2022    Colonoscopy- declined  Lung Cancer Screening: (Low Dose CT Chest recommended if Age 52-80 years, 30 pack-year currently smoking OR have quit w/in 15years.) does not qualify.   Hepatitis C Screening: does not qualify.  Vision Screening: Recommended annual ophthalmology exams for early detection of glaucoma and other disorders of the eye.  Dental Screening: Recommended annual dental exams for proper oral hygiene  Community Resource Referral / Chronic Care Management: CRR required this visit?  No   CCM required this visit?  No      Plan:     I have personally reviewed and noted the following in the patient's chart:   Medical and social history Use of alcohol, tobacco or illicit drugs  Current medications and supplements including opioid prescriptions. Patient is not currently taking opioid prescriptions. Functional ability and status Nutritional status Physical activity Advanced directives List of other  physicians Hospitalizations, surgeries, and ER visits in previous 12 months Vitals Screenings to include cognitive, depression, and falls Referrals and appointments  In addition, I have reviewed and discussed with patient certain preventive protocols, quality metrics, and best practice recommendations. A written personalized care plan for preventive services as well as general preventive health recommendations were provided to patient.     Leta Jungling, LPN   [id number]

## 2022-08-12 NOTE — Patient Instructions (Addendum)
Brett [name] , Thank you for taking time to come for your Medicare Wellness Visit. I appreciate your ongoing commitment to your health goals. Please review the following plan we discussed and let me know if I can assist you in the future.   These are the goals we discussed:  Goals       Patient Stated     Maintain Healthy Lifestyle (pt-stated)      Stay active Healthy diet Stay hydrated        This is a list of the screening recommended for you and due dates:  Health Maintenance  Topic Date Due   Colon Cancer Screening  01/24/2022   Yearly kidney health urinalysis for diabetes  06/29/2022   Hemoglobin A1C  08/04/2022   COVID-19 Vaccine (6 - 2023-24 season) 08/28/2022*   Yearly kidney function blood test for diabetes  02/02/2023   Complete foot exam   02/02/2023   DTaP/Tdap/Td vaccine (4 - Td or Tdap) 03/17/2023   Eye exam for diabetics  03/25/2023   Medicare Annual Wellness Visit  08/12/2023   Pneumonia Vaccine  Completed   Flu Shot  Completed   Zoster (Shingles) Vaccine  Completed   HPV Vaccine  Aged Out   Hepatitis C Screening: USPSTF Recommendation to screen - Ages 75-79 yo.  Discontinued  *Topic was postponed. The date shown is not the original due date.    Advanced directives: End of life planning; Advance aging; Advanced directives discussed.  Copy of current HCPOA/Living Will requested.    Conditions/risks identified: none new  Next appointment: Follow up in one year for your annual wellness visit.   Preventive Care 66 Years and Older, Male  Preventive care refers to lifestyle choices and visits with your health care provider that can promote health and wellness. What does preventive care include? A yearly physical exam. This is also called an annual well check. Dental exams once or twice a year. Routine eye exams. Ask your health care provider how often you should have your eyes checked. Personal lifestyle choices, including: Daily care of your teeth and  gums. Regular physical activity. Eating a healthy diet. Avoiding tobacco and drug use. Limiting alcohol use. Practicing safe sex. Taking low doses of aspirin every day. Taking vitamin and mineral supplements as recommended by your health care provider. What happens during an annual well check? The services and screenings done by your health care provider during your annual well check will depend on your age, overall health, lifestyle risk factors, and family history of disease. Counseling  Your health care provider may ask you questions about your: Alcohol use. Tobacco use. Drug use. Emotional well-being. Home and relationship well-being. Sexual activity. Eating habits. History of falls. Memory and ability to understand (cognition). Work and work Statistician. Screening  You may have the following tests or measurements: Height, weight, and BMI. Blood pressure. Lipid and cholesterol levels. These may be checked every 5 years, or more frequently if you are over 82 years old. Skin check. Lung cancer screening. You may have this screening every year starting at age 53 if you have a 30-pack-year history of smoking and currently smoke or have quit within the past 15 years. Fecal occult blood test (FOBT) of the stool. You may have this test every year starting at age 55. Flexible sigmoidoscopy or colonoscopy. You may have a sigmoidoscopy every 5 years or a colonoscopy every 10 years starting at age 16. Prostate cancer screening. Recommendations will vary depending on your family history and  other risks. Hepatitis C blood test. Hepatitis B blood test. Sexually transmitted disease (STD) testing. Diabetes screening. This is done by checking your blood sugar (glucose) after you have not eaten for a while (fasting). You may have this done every 1-3 years. Abdominal aortic aneurysm (AAA) screening. You may need this if you are a current or former smoker. Osteoporosis. You may be screened  starting at age 80 if you are at high risk. Talk with your health care provider about your test results, treatment options, and if necessary, the need for more tests. Vaccines  Your health care provider may recommend certain vaccines, such as: Influenza vaccine. This is recommended every year. Tetanus, diphtheria, and acellular pertussis (Tdap, Td) vaccine. You may need a Td booster every 10 years. Zoster vaccine. You may need this after age 39. Pneumococcal 13-valent conjugate (PCV13) vaccine. One dose is recommended after age 24. Pneumococcal polysaccharide (PPSV23) vaccine. One dose is recommended after age 50. Talk to your health care provider about which screenings and vaccines you need and how often you need them. This information is not intended to replace advice given to you by your health care provider. Make sure you discuss any questions you have with your health care provider. Document Released: 06/20/2015 Document Revised: 02/11/2016 Document Reviewed: 03/25/2015 Elsevier Interactive Patient Education  2017 [employer] Prevention in the Home Falls can cause injuries. They can happen to people of all ages. There are many things you can do to make your home safe and to help prevent falls. What can I do on the outside of my home? Regularly fix the edges of walkways and driveways and fix any cracks. Remove anything that might make you trip as you walk through a door, such as a raised step or threshold. Trim any bushes or trees on the path to your home. Use bright outdoor lighting. Clear any walking paths of anything that might make someone trip, such as rocks or tools. Regularly check to see if handrails are loose or broken. Make sure that both sides of any steps have handrails. Any raised decks and porches should have guardrails on the edges. Have any leaves, snow, or ice cleared regularly. Use sand or salt on walking paths during winter. Clean up any spills in your garage  right away. This includes oil or grease spills. What can I do in the bathroom? Use night lights. Install grab bars by the toilet and in the tub and shower. Do not use towel bars as grab bars. Use non-skid mats or decals in the tub or shower. If you need to sit down in the shower, use a plastic, non-slip stool. Keep the floor dry. Clean up any water that spills on the floor as soon as it happens. Remove soap buildup in the tub or shower regularly. Attach bath mats securely with double-sided non-slip rug tape. Do not have throw rugs and other things on the floor that can make you trip. What can I do in the bedroom? Use night lights. Make sure that you have a light by your bed that is easy to reach. Do not use any sheets or blankets that are too big for your bed. They should not hang down onto the floor. Have a firm chair that has side arms. You can use this for support while you get dressed. Do not have throw rugs and other things on the floor that can make you trip. What can I do in the kitchen? Clean up any spills right  away. Avoid walking on wet floors. Keep items that you use a lot in easy-to-reach places. If you need to reach something above you, use a strong step stool that has a grab bar. Keep electrical cords out of the way. Do not use floor polish or wax that makes floors slippery. If you must use wax, use non-skid floor wax. Do not have throw rugs and other things on the floor that can make you trip. What can I do with my stairs? Do not leave any items on the stairs. Make sure that there are handrails on both sides of the stairs and use them. Fix handrails that are broken or loose. Make sure that handrails are as long as the stairways. Check any carpeting to make sure that it is firmly attached to the stairs. Fix any carpet that is loose or worn. Avoid having throw rugs at the top or bottom of the stairs. If you do have throw rugs, attach them to the floor with carpet tape. Make  sure that you have a light switch at the top of the stairs and the bottom of the stairs. If you do not have them, ask someone to add them for you. What else can I do to help prevent falls? Wear shoes that: Do not have high heels. Have rubber bottoms. Are comfortable and fit you well. Are closed at the toe. Do not wear sandals. If you use a stepladder: Make sure that it is fully opened. Do not climb a closed stepladder. Make sure that both sides of the stepladder are locked into place. Ask someone to hold it for you, if possible. Clearly mark and make sure that you can see: Any grab bars or handrails. First and last steps. Where the edge of each step is. Use tools that help you move around (mobility aids) if they are needed. These include: Canes. Walkers. Scooters. Crutches. Turn on the lights when you go into a dark area. Replace any light bulbs as soon as they burn out. Set up your furniture so you have a clear path. Avoid moving your furniture around. If any of your floors are uneven, fix them. If there are any pets around you, be aware of where they are. Review your medicines with your doctor. Some medicines can make you feel dizzy. This can increase your chance of falling. Ask your doctor what other things that you can do to help prevent falls. This information is not intended to replace advice given to you by your health care provider. Make sure you discuss any questions you have with your health care provider. Document Released: 03/20/2009 Document Revised: 10/30/2015 Document Reviewed: 06/28/2014 Elsevier Interactive Patient Education  2017 [employer].

## 2022-08-19 ENCOUNTER — Other Ambulatory Visit: Payer: Self-pay | Admitting: Cardiovascular Disease

## 2022-09-15 ENCOUNTER — Telehealth: Payer: Self-pay | Admitting: Internal Medicine

## 2022-09-15 DIAGNOSIS — E785 Hyperlipidemia, unspecified: Secondary | ICD-10-CM

## 2022-09-15 DIAGNOSIS — E11311 Type 2 diabetes mellitus with unspecified diabetic retinopathy with macular edema: Secondary | ICD-10-CM

## 2022-09-15 DIAGNOSIS — D649 Anemia, unspecified: Secondary | ICD-10-CM

## 2022-09-15 DIAGNOSIS — E039 Hypothyroidism, unspecified: Secondary | ICD-10-CM

## 2022-09-15 MED ORDER — LEVOTHYROXINE SODIUM 100 MCG PO TABS: 100.0000 ug | ORAL_TABLET | Freq: Every day | ORAL | 0 refills | Status: DC

## 2022-09-15 NOTE — Addendum Note (Signed)
Addended by: Sandy Salaam on: 09/15/2022 04:56 PM   Modules accepted: Orders

## 2022-09-15 NOTE — Telephone Encounter (Signed)
Medication has been refilled. Spoke with pt and scheduled him for a follow up appt and lab appt. Labs have been ordered.

## 2022-09-15 NOTE — Telephone Encounter (Signed)
Prescription Request  09/15/2022  LOV: 02/01/2022  What is the name of the medication or equipment? levothyroxine (SYNTHROID) 100 MCG tablet   Have you contacted your pharmacy to request a refill? Yes   Which pharmacy would you like this sent to?  [id number] [address] [place] [address] Phone: [phone] Fax: [phone]   Patient notified that their request is being sent to the clinical staff for review and that they should receive a response within 2 business days.   Please advise at [facility] [phone]

## 2022-09-29 ENCOUNTER — Other Ambulatory Visit: Payer: PPO

## 2022-10-01 ENCOUNTER — Other Ambulatory Visit: Payer: PPO

## 2022-10-06 ENCOUNTER — Ambulatory Visit (INDEPENDENT_AMBULATORY_CARE_PROVIDER_SITE_OTHER): Payer: PPO | Admitting: Internal Medicine

## 2022-10-06 ENCOUNTER — Encounter: Payer: Self-pay | Admitting: Internal Medicine

## 2022-10-06 VITALS — BP 128/74 | HR 72 | Temp 98.1°F | Ht 71.0 in | Wt 171.0 lb

## 2022-10-06 DIAGNOSIS — I700 Atherosclerosis of aorta: Secondary | ICD-10-CM | POA: Diagnosis not present

## 2022-10-06 DIAGNOSIS — L03012 Cellulitis of left finger: Secondary | ICD-10-CM

## 2022-10-06 DIAGNOSIS — E083592 Diabetes mellitus due to underlying condition with proliferative diabetic retinopathy without macular edema, left eye: Secondary | ICD-10-CM

## 2022-10-06 DIAGNOSIS — Z7984 Long term (current) use of oral hypoglycemic drugs: Secondary | ICD-10-CM | POA: Diagnosis not present

## 2022-10-06 DIAGNOSIS — E119 Type 2 diabetes mellitus without complications: Secondary | ICD-10-CM

## 2022-10-06 DIAGNOSIS — E11311 Type 2 diabetes mellitus with unspecified diabetic retinopathy with macular edema: Secondary | ICD-10-CM

## 2022-10-06 MED ORDER — DULOXETINE HCL 30 MG PO CPEP: ORAL_CAPSULE | ORAL | 1 refills | Status: DC

## 2022-10-06 MED ORDER — EMPAGLIFLOZIN 25 MG PO TABS: 25.0000 mg | ORAL_TABLET | Freq: Every day | ORAL | 1 refills | Status: AC

## 2022-10-06 MED ORDER — CEPHALEXIN 500 MG PO CAPS: 500.0000 mg | ORAL_CAPSULE | Freq: Four times a day (QID) | ORAL | 0 refills | Status: DC

## 2022-10-06 NOTE — Patient Instructions (Addendum)
Keflex  4 times daily for  7 days to treat paronychia ("Pair un  EECHIA"); infection of the cuticle .  Caused by [facility]!  Ok to soak  in warm salt water    NO PICKING!!!  Daily use of a probiotic advised for 3 weeks.    If your A1c is > 7.0 you will need to start monitoring your blood sugars so I can adjust your medications .  Continue Jardiance.   Checking  your blood sugar 2 hrs after a favorite snack to see how it affects glycemic control goal is  recommended:   goal is 160 or less 2 hours after eating   Fasting sugars should be 130 or less

## 2022-10-06 NOTE — Progress Notes (Addendum)
Subjective:  Patient ID: Brett [name], male    DOB: 06-19-1940  Age: 82 y.o. MRN: [id number]  CC: The primary encounter diagnosis was Diabetes mellitus treated with oral medication (HCC). 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   HPI Brett [name] presents for  Chief Complaint  Patient presents with   Medical Management of Chronic Issues    1) LEFT THUMB PARONYCHIA , SWELLING AND PAIN FOR 2 WEEKS. ; PICKS AT CUTICLES NO OTHER RISKS  2)  type 2 DM:   He feels generally well, is exercising several times per week ;  not checking blood sugars .  Denies any recent hypoglyemic symptoms. Taking his medications (Jardiance ) as directed. Following a carbohydrate modified diet 6 days per week. Denies numbness, burning and tingling of extremities. Appetite is good.    walking daily for 45 minutes .   Occasional ice cream at night   Outpatient Medications Prior to Visit  Medication Sig Dispense Refill   acetaminophen (TYLENOL) 500 MG tablet Take 1-2 tablets (500-1,000 mg total) by mouth in the morning, at noon, and at bedtime.     aspirin EC 81 MG tablet Take 81 mg by mouth daily.     Cholecalciferol (VITAMIN D) 50 MCG (2000 UT) tablet Take 2,000 Units by mouth daily.     ezetimibe (ZETIA) 10 MG tablet Take 1 tablet (10 mg total) by mouth daily. 90 tablet 2   FREESTYLE LITE test strip CHECK BLOOD SUGAR DAILY AS DIRECTED 100 strip 5   glucose monitoring kit (FREESTYLE) monitoring kit 1 each by Does not apply route as needed for other. For use daily to monitor diabetes.  Please include lancets .  Test once daily   E11.9 1 each 3   Lancets (FREESTYLE) lancets Use as instructed 100 each 12   levothyroxine (SYNTHROID) 100 MCG tablet Take 1 tablet (100 mcg total) by mouth daily. 30  minutes before food in am 90 tablet 0   Multiple Vitamin (MULTIVITAMIN) tablet Take 1 tablet by mouth daily.     atorvastatin (LIPITOR) 80 MG tablet TAKE 1 TABLET BY MOUTH EVERY DAY 90 tablet 0   DULoxetine (CYMBALTA) 30 MG capsule TAKE 1 CAPSULE BY MOUTH EVERY DAY 90 capsule 1   empagliflozin (JARDIANCE) 25 MG TABS tablet Take 1 tablet (25 mg total) by mouth daily before breakfast. 90 tablet 1   amoxicillin-clavulanate (AUGMENTIN) 875-125 MG tablet Take 1 tablet by mouth 2 (two) times daily. (Patient not taking: Reported on 08/09/2022) 20 tablet 0   azithromycin (ZITHROMAX) 500 MG tablet Take 1 tablet (500 mg total) by mouth daily. (Patient not taking: Reported on 10/06/2022) 7 tablet 0   No facility-administered medications prior to visit.    Review of Systems;  Patient denies headache, fevers, malaise, unintentional weight loss, skin rash, eye pain, sinus congestion and sinus pain, sore throat, dysphagia,  hemoptysis , cough, dyspnea, wheezing, chest pain, palpitations, orthopnea, edema, abdominal pain, nausea, melena, diarrhea, constipation, flank pain, dysuria, hematuria, urinary  Frequency, nocturia, numbness, tingling, seizures,  Focal weakness, Loss of consciousness,  Tremor, insomnia, depression, anxiety, and suicidal ideation.      Objective:  BP 128/74   Pulse 72   Temp 98.1 F (36.7 C) (Oral)   Ht 5\' 11"  (1.803 m)   Wt 171 lb (77.6 kg)  SpO2 98%   BMI 23.85 kg/m   BP Readings from Last 3 Encounters:  10/06/22 128/74  08/09/22 104/60  05/13/22 112/72    Wt Readings from Last 3 Encounters:  10/06/22 171 lb (77.6 kg)  08/12/22 175 lb (79.4 kg)  08/09/22 175 lb 2 oz (79.4 kg)    Physical Exam Vitals reviewed.  Constitutional:      General: He is not in acute distress.    Appearance: Normal appearance. He is normal weight. He is not ill-appearing, toxic-appearing or diaphoretic.  HENT:     Head: Normocephalic.  Eyes:     General: No scleral icterus.       Right  eye: No discharge.        Left eye: No discharge.     Conjunctiva/sclera: Conjunctivae normal.  Cardiovascular:     Rate and Rhythm: Normal rate and regular rhythm.     Heart sounds: Normal heart sounds.  Pulmonary:     Effort: Pulmonary effort is normal. No respiratory distress.     Breath sounds: Normal breath sounds.  Musculoskeletal:        General: Normal range of motion.     Cervical back: Normal range of motion.  Skin:    General: Skin is warm and dry.     Comments: Swelling and redness at the base of left thumb   Neurological:     General: No focal deficit present.     Mental Status: He is alert and oriented to person, place, and time. Mental status is at baseline.  Psychiatric:        Mood and Affect: Mood normal.        Behavior: Behavior normal.        Thought Content: Thought content normal.        Judgment: Judgment normal.    Lab Results  Component Value Date   HGBA1C 7.3 (A) 02/01/2022   HGBA1C 7.6 (A) 10/19/2021   HGBA1C 7.2 (H) 06/19/2021    Lab Results  Component Value Date   CREATININE 1.10 02/01/2022   CREATININE 1.27 10/19/2021   CREATININE 0.97 06/29/2021    Lab Results  Component Value Date   WBC 6.8 06/29/2021   HGB 13.3 06/29/2021   HCT 41.1 06/29/2021   PLT 205.0 06/29/2021   GLUCOSE 122 (H) 02/01/2022   CHOL 128 02/01/2022   TRIG 144.0 02/01/2022   HDL 57.20 02/01/2022   LDLDIRECT 49.0 02/01/2022   LDLCALC 42 02/01/2022   ALT 32 02/01/2022   AST 27 02/01/2022   NA 144 02/01/2022   K 3.9 02/01/2022   CL 107 02/01/2022   CREATININE 1.10 02/01/2022   BUN 22 02/01/2022   CO2 25 02/01/2022   TSH 0.60 02/01/2022   PSA 0.42 04/03/2018   HGBA1C 7.3 (A) 02/01/2022   MICROALBUR 0.8 06/29/2021     Assessment & Plan:  .Diabetes mellitus treated with oral medication (HCC) Assessment & Plan: Currently under reasonable control  on  current medications .   Marland Kitchen Patient is reminded to schedule an annual eye exam and foot exam is normal  today. Patient has no microalbuminuria. Patient is tolerating statin therapy for CAD risk reduction and on ACE/ARB for renal protection and hypertension    Lab Results  Component Value Date   HGBA1C 7.3 (A) 02/01/2022      Aortic atherosclerosis (HCC) Assessment & Plan: Reviewed findings of prior CT scan today..  Patient is tolerating high potency statin therapy    Paronychia of left thumb  Assessment & Plan: Secondary to picking at cuticles.  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  Dispense: 90 tablet; Refill: 1 -     Cephalexin; Take 1 capsule (500 mg total) by mouth 4 (four) times daily.  Dispense: 28 capsule; Refill: 0     Follow-up: Return in about 3 months (around 01/05/2023) for follow up diabetes.   Sherlene Shams, MD

## 2022-10-06 NOTE — Assessment & Plan Note (Signed)
Reviewed findings of prior CT scan today..  Patient is tolerating high potency statin therapy  

## 2022-10-06 NOTE — Assessment & Plan Note (Signed)
ANNUAL EYE EXAMS DONE AT [place]

## 2022-10-08 DIAGNOSIS — L03012 Cellulitis of left finger: Secondary | ICD-10-CM | POA: Insufficient documentation

## 2022-10-08 NOTE — Assessment & Plan Note (Signed)
Secondary to picking at cuticles.  Keflex prescribed

## 2022-10-08 NOTE — Progress Notes (Incomplete)
Subjective:  Patient ID: Brett [name], male    DOB: September 26, 1940  Age: 82 y.o. MRN: [id number]  CC: The primary encounter diagnosis was Diabetes mellitus treated with oral medication (HCC). 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   HPI Brett [name] presents for  Chief Complaint  Patient presents with  . Medical Management of Chronic Issues    1) LEFT THUMB PARONYCHIA , SWELLING AND PAIN FOR 2 WEEKS. ; PICKS AT CUTICLES NO OTHER RISKS  2)  type 2 DM:  walking daily for 45 minutes  taking jardiance . Not checking sugars.  Following a low GI diet.  Occasional ice cream at night   Outpatient Medications Prior to Visit  Medication Sig Dispense Refill  . acetaminophen (TYLENOL) 500 MG tablet Take 1-2 tablets (500-1,000 mg total) by mouth in the morning, at noon, and at bedtime.    Marland Kitchen aspirin EC 81 MG tablet Take 81 mg by mouth daily.    Marland Kitchen atorvastatin (LIPITOR) 80 MG tablet TAKE 1 TABLET BY MOUTH EVERY DAY 90 tablet 0  . Cholecalciferol (VITAMIN D) 50 MCG (2000 UT) tablet Take 2,000 Units by mouth daily.    Marland Kitchen ezetimibe (ZETIA) 10 MG tablet Take 1 tablet (10 mg total) by mouth daily. 90 tablet 2  . FREESTYLE LITE test strip CHECK BLOOD SUGAR DAILY AS DIRECTED 100 strip 5  . glucose monitoring kit (FREESTYLE) monitoring kit 1 each by Does not apply route as needed for other. For use daily to monitor diabetes.  Please include lancets .  Test once daily   E11.9 1 each 3  . Lancets (FREESTYLE) lancets Use as instructed 100 each 12  . levothyroxine (SYNTHROID) 100 MCG tablet Take 1 tablet (100 mcg total) by mouth daily. 30 minutes before food in am 90 tablet 0  . Multiple Vitamin (MULTIVITAMIN) tablet Take 1 tablet by mouth daily.    . DULoxetine (CYMBALTA) 30 MG capsule TAKE 1 CAPSULE BY MOUTH EVERY DAY 90 capsule 1  .  empagliflozin (JARDIANCE) 25 MG TABS tablet Take 1 tablet (25 mg total) by mouth daily before breakfast. 90 tablet 1  . amoxicillin-clavulanate (AUGMENTIN) 875-125 MG tablet Take 1 tablet by mouth 2 (two) times daily. (Patient not taking: Reported on 08/09/2022) 20 tablet 0  . azithromycin (ZITHROMAX) 500 MG tablet Take 1 tablet (500 mg total) by mouth daily. (Patient not taking: Reported on 10/06/2022) 7 tablet 0   No facility-administered medications prior to visit.    Review of Systems;  Patient denies headache, fevers, malaise, unintentional weight loss, skin rash, eye pain, sinus congestion and sinus pain, sore throat, dysphagia,  hemoptysis , cough, dyspnea, wheezing, chest pain, palpitations, orthopnea, edema, abdominal pain, nausea, melena, diarrhea, constipation, flank pain, dysuria, hematuria, urinary  Frequency, nocturia, numbness, tingling, seizures,  Focal weakness, Loss of consciousness,  Tremor, insomnia, depression, anxiety, and suicidal ideation.      Objective:  BP 128/74   Pulse 72   Temp 98.1 F (36.7 C) (Oral)   Ht 5\' 11"  (1.803 m)   Wt 171 lb (77.6 kg)   SpO2 98%   BMI 23.85 kg/m   BP Readings from Last 3 Encounters:  10/06/22 128/74  08/09/22 104/60  05/13/22 112/72    Wt Readings from Last 3 Encounters:  10/06/22 171 lb (77.6 kg)  08/12/22 175 lb (79.4 kg)  08/09/22 175 lb  2 oz (79.4 kg)    Physical Exam Vitals reviewed.  Constitutional:      General: He is not in acute distress.    Appearance: Normal appearance. He is normal weight. He is not ill-appearing, toxic-appearing or diaphoretic.  HENT:     Head: Normocephalic.  Eyes:     General: No scleral icterus.       Right eye: No discharge.        Left eye: No discharge.     Conjunctiva/sclera: Conjunctivae normal.  Cardiovascular:     Rate and Rhythm: Normal rate and regular rhythm.     Heart sounds: Normal heart sounds.  Pulmonary:     Effort: Pulmonary effort is normal. No respiratory  distress.     Breath sounds: Normal breath sounds.  Musculoskeletal:        General: Normal range of motion.     Cervical back: Normal range of motion.  Skin:    General: Skin is warm and dry.     Comments: Swelling and redness at the base of left thumb   Neurological:     General: No focal deficit present.     Mental Status: He is alert and oriented to person, place, and time. Mental status is at baseline.  Psychiatric:        Mood and Affect: Mood normal.        Behavior: Behavior normal.        Thought Content: Thought content normal.        Judgment: Judgment normal.    Lab Results  Component Value Date   HGBA1C 7.3 (A) 02/01/2022   HGBA1C 7.6 (A) 10/19/2021   HGBA1C 7.2 (H) 06/19/2021    Lab Results  Component Value Date   CREATININE 1.10 02/01/2022   CREATININE 1.27 10/19/2021   CREATININE 0.97 06/29/2021    Lab Results  Component Value Date   WBC 6.8 06/29/2021   HGB 13.3 06/29/2021   HCT 41.1 06/29/2021   PLT 205.0 06/29/2021   GLUCOSE 122 (H) 02/01/2022   CHOL 128 02/01/2022   TRIG 144.0 02/01/2022   HDL 57.20 02/01/2022   LDLDIRECT 49.0 02/01/2022   LDLCALC 42 02/01/2022   ALT 32 02/01/2022   AST 27 02/01/2022   NA 144 02/01/2022   K 3.9 02/01/2022   CL 107 02/01/2022   CREATININE 1.10 02/01/2022   BUN 22 02/01/2022   CO2 25 02/01/2022   TSH 0.60 02/01/2022   PSA 0.42 04/03/2018   HGBA1C 7.3 (A) 02/01/2022   MICROALBUR 0.8 06/29/2021    ECHOCARDIOGRAM COMPLETE  Result Date: 06/19/2021    ECHOCARDIOGRAM REPORT   Patient Name:   Brett [name] Date of Exam: 06/19/2021 Medical Rec #:  [id number]  Height:       71.0 in Accession #:    [id number] Weight:       180.0 lb Date of Birth:  [birth year] BSA:          2.016 m Patient Age:    80 years   BP:           112/69 mmHg Patient Gender: M          HR:           53 bpm. Exam Location:  [facility] Procedure: 2D Echo, Cardiac Doppler and Color Doppler Indications:     Syncope R55  History:         Patient has no prior  history of Echocardiogram examinations.  Previous Myocardial Infarction; Risk Factors:Diabetes and                  Hypertension.  Sonographer:     Cristela Blue Referring Phys:  [id number] AMY N [facility] Diagnosing Phys: Julien Nordmann MD IMPRESSIONS  1. Left ventricular ejection fraction, by estimation, is 60 to 65%. The left ventricle has normal function. The left ventricle has no regional wall motion abnormalities. Left ventricular diastolic parameters are consistent with Grade I diastolic dysfunction (impaired relaxation).  2. Right ventricular systolic function is normal. The right ventricular size is normal.  3. Left atrial size was mildly dilated.  4. The mitral valve is normal in structure. Mild mitral valve regurgitation. No evidence of mitral stenosis.  5. The aortic valve is normal in structure. Aortic valve regurgitation is not visualized. Aortic valve sclerosis/calcification is present, without any evidence of aortic stenosis.  6. The inferior vena cava is normal in size with greater than 50% respiratory variability, suggesting right atrial pressure of 3 mmHg. FINDINGS  Left Ventricle: Left ventricular ejection fraction, by estimation, is 60 to 65%. The left ventricle has normal function. The left ventricle has no regional wall motion abnormalities. The left ventricular internal cavity size was normal in size. There is  no left ventricular hypertrophy. Left ventricular diastolic parameters are consistent with Grade I diastolic dysfunction (impaired relaxation). Right Ventricle: The right ventricular size is normal. No increase in right ventricular wall thickness. Right ventricular systolic function is normal. Left Atrium: Left atrial size was mildly dilated. Right Atrium: Right atrial size was normal in size. Pericardium: There is no evidence of pericardial effusion. Mitral Valve: The mitral valve is normal in structure. Mild mitral valve regurgitation. No evidence of mitral valve stenosis. MV  peak gradient, 3.6 mmHg. The mean mitral valve gradient is 1.0 mmHg. Tricuspid Valve: The tricuspid valve is normal in structure. Tricuspid valve regurgitation is not demonstrated. No evidence of tricuspid stenosis. Aortic Valve: The aortic valve is normal in structure. Aortic valve regurgitation is not visualized. Aortic valve sclerosis/calcification is present, without any evidence of aortic stenosis. Aortic valve mean gradient measures 2.0 mmHg. Aortic valve peak  gradient measures 4.4 mmHg. Aortic valve area, by VTI measures 2.24 cm. Pulmonic Valve: The pulmonic valve was normal in structure. Pulmonic valve regurgitation is not visualized. No evidence of pulmonic stenosis. Aorta: The aortic root is normal in size and structure. Venous: The inferior vena cava is normal in size with greater than 50% respiratory variability, suggesting right atrial pressure of 3 mmHg. IAS/Shunts: No atrial level shunt detected by color flow Doppler.  LEFT VENTRICLE PLAX 2D LVIDd:         4.60 cm   Diastology LVIDs:         3.00 cm   LV e' medial:    7.62 cm/s LV PW:         1.00 cm   LV E/e' medial:  7.4 LV IVS:        1.00 cm   LV e' lateral:   12.50 cm/s LVOT diam:     2.00 cm   LV E/e' lateral: 4.5 LV SV:         47 LV SV Index:   23 LVOT Area:     3.14 cm  RIGHT VENTRICLE RV Basal diam:  3.40 cm RV S prime:     9.79 cm/s TAPSE (M-mode): 1.6 cm LEFT ATRIUM           Index  RIGHT ATRIUM           Index LA diam:      4.50 cm 2.23 cm/m   RA Area:     21.30 cm LA Vol (A2C): 40.0 ml 19.84 ml/m  RA Volume:   66.50 ml  32.98 ml/m LA Vol (A4C): 59.4 ml 29.46 ml/m  AORTIC VALVE                    PULMONIC VALVE AV Area (Vmax):    2.13 cm     PV Vmax:        0.56 m/s AV Area (Vmean):   2.00 cm     PV Vmean:       39.800 cm/s AV Area (VTI):     2.24 cm     PV VTI:         0.124 m AV Vmax:           105.00 cm/s  PV Peak grad:   1.3 mmHg AV Vmean:          73.600 cm/s  PV Mean grad:   1.0 mmHg AV VTI:            0.209 m       RVOT Peak grad: 3 mmHg AV Peak Grad:      4.4 mmHg AV Mean Grad:      2.0 mmHg LVOT Vmax:         71.10 cm/s LVOT Vmean:        46.800 cm/s LVOT VTI:          0.149 m LVOT/AV VTI ratio: 0.71  AORTA Ao Root diam: 3.10 cm MITRAL VALVE               TRICUSPID VALVE MV Area (PHT): 1.84 cm    TR Peak grad:   14.6 mmHg MV Area VTI:   1.61 cm    TR Vmax:        191.00 cm/s MV Peak grad:  3.6 mmHg MV Mean grad:  1.0 mmHg    SHUNTS MV Vmax:       0.95 m/s    Systemic VTI:  0.15 m MV Vmean:      46.4 cm/s   Systemic Diam: 2.00 cm MV Decel Time: 413 msec    Pulmonic VTI:  0.158 m MV E velocity: 56.10 cm/s MV A velocity: 78.80 cm/s MV E/A ratio:  0.71 Julien Nordmann MD Electronically signed by Julien Nordmann MD Signature Date/Time: 06/19/2021/6:02:52 PM    Final     Assessment & Plan:  .Diabetes mellitus treated with oral medication (HCC)  Aortic atherosclerosis (HCC) Assessment & Plan: Reviewed findings of prior CT scan today..  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  Dispense: 90 tablet; Refill: 1 -     Cephalexin; Take 1 capsule (500 mg total) by mouth 4 (four) times daily.  Dispense: 28 capsule; Refill: 0     I provided 30 minutes of face-to-face time during this encounter reviewing patient's last visit with me, patient's  most recent visit with  cardiology,  nephrology,  and neurology,  recent surgical and non surgical procedures, previous  labs and imaging studies, counseling on currently addressed issues,  and post visit ordering to diagnostics and therapeutics .   Follow-up: Return in about 3 months (around  01/05/2023) for follow up diabetes.   Sherlene Shams, MD

## 2022-10-08 NOTE — Assessment & Plan Note (Signed)
Currently under reasonable control  on  current medications .   Marland Kitchen Patient is reminded to schedule an annual eye exam and foot exam is normal today. Patient has no microalbuminuria. Patient is tolerating statin therapy for CAD risk reduction and on ACE/ARB for renal protection and hypertension    Lab Results  Component Value Date   HGBA1C 7.3 (A) 02/01/2022

## 2022-10-22 ENCOUNTER — Other Ambulatory Visit: Payer: Self-pay | Admitting: Cardiovascular Disease

## 2022-10-22 NOTE — Assessment & Plan Note (Signed)
Diabeic eye exams are done at the [facility]

## 2022-12-03 ENCOUNTER — Other Ambulatory Visit: Payer: Self-pay | Admitting: Internal Medicine

## 2022-12-03 DIAGNOSIS — E039 Hypothyroidism, unspecified: Secondary | ICD-10-CM

## 2022-12-17 ENCOUNTER — Ambulatory Visit
Admission: RE | Admit: 2022-12-17 | Discharge: 2022-12-17 | Disposition: A | Discharge status: Home / Self Care | Payer: PPO | Source: Ambulatory Visit | Attending: Nurse Practitioner | Admitting: Nurse Practitioner

## 2022-12-17 ENCOUNTER — Ambulatory Visit
Admission: RE | Admit: 2022-12-17 | Discharge: 2022-12-17 | Disposition: A | Discharge status: Home / Self Care | Payer: PPO | Attending: Nurse Practitioner | Admitting: Nurse Practitioner

## 2022-12-17 ENCOUNTER — Ambulatory Visit: Payer: PPO | Admitting: Nurse Practitioner

## 2022-12-17 VITALS — BP 112/60 | HR 56 | Temp 98.0°F | Ht 71.0 in | Wt 172.0 lb

## 2022-12-17 DIAGNOSIS — J984 Other disorders of lung: Secondary | ICD-10-CM | POA: Diagnosis not present

## 2022-12-17 DIAGNOSIS — R059 Cough, unspecified: Secondary | ICD-10-CM | POA: Diagnosis not present

## 2022-12-17 DIAGNOSIS — R0989 Other specified symptoms and signs involving the circulatory and respiratory systems: Secondary | ICD-10-CM | POA: Diagnosis not present

## 2022-12-17 DIAGNOSIS — R058 Other specified cough: Secondary | ICD-10-CM | POA: Insufficient documentation

## 2022-12-17 MED ORDER — AMOXICILLIN-POT CLAVULANATE 875-125 MG PO TABS
1.0000 | ORAL_TABLET | Freq: Two times a day (BID) | ORAL | 0 refills | Status: DC
Start: 2022-12-17 — End: 2023-01-11

## 2022-12-17 NOTE — Patient Instructions (Signed)
X-rays ordered.  Increase fluid intake. Take OTC plain mucinex. Antibiotic sent to the pharmacy.

## 2022-12-17 NOTE — Progress Notes (Signed)
Established Patient Office Visit  Subjective:  Patient ID: Brett [name], male    DOB: August 06, 1940  Age: 82 y.o. MRN: [id number]  CC:  Chief Complaint  Patient presents with   Cough    Cough and congestion x 1 1/2- 2 weeks    HPI  Brett [name] presents for   Cough This is a new problem. The current episode started 1 to 4 weeks ago. The problem has been gradually worsening. The cough is Non-productive. Associated symptoms include postnasal drip and rhinorrhea. Pertinent negatives include no chest pain, chills, fever, headaches, nasal congestion or sore throat. Associated symptoms comments: Hoarseness of voice. The symptoms are aggravated by lying down. There is no history of asthma or emphysema.    Patient's wife had pneumonia 2 weeks ago and  would like to get the chest x-ray.  Past Medical History:  Diagnosis Date   Allergy    resolved after CABG   Calcific Achilles tendonitis    Calculus of ureter    Coronary artery disease    Diabetes mellitus    H/O renal calculi 2012   Hematuria, unspecified    Hyperlipidemia    Hypertension    Impotence of organic origin    Myocardial infarction [facility])    Peyronie's disease    Pulmonary nodule seen on imaging study 2012   Sleep difficulties    Swelling, mass, or lump in chest    Unspecified hypothyroidism     Past Surgical History:  Procedure Laterality Date   CARDIAC CATHETERIZATION  2001   [facility] x3 stent   COLONOSCOPY     COLONOSCOPY WITH PROPOFOL N/A 01/24/2017   Procedure: COLONOSCOPY WITH PROPOFOL;  Surgeon: Scot Jun, MD;  Location: [facility] ENDOSCOPY;  Service: Endoscopy;  Laterality: N/A;   CORONARY ARTERY BYPASS GRAFT  2006   4 vessel, after 3 or 4 stents    CORONARY ARTERY BYPASS GRAFT     CYSTOSCOPY W/ RETROGRADES  01/18/2020   Procedure: CYSTOSCOPY WITH RETROGRADE PYELOGRAM;  Surgeon: Sondra Come, MD;  Location: [facility] ORS;  Service: Urology;;   CYSTOSCOPY/URETEROSCOPY/HOLMIUM LASER/STENT  PLACEMENT Right 01/18/2020   Procedure: CYSTOSCOPY/URETEROSCOPY/HOLMIUM LASER/STENT PLACEMENT;  Surgeon: Sondra Come, MD;  Location: [facility] ORS;  Service: Urology;  Laterality: Right;   OSTECTOMY CALCANEUS Left    PENILE PROSTHESIS IMPLANT      Family History  Problem Relation Age of Onset   Hyperlipidemia Mother    AAA (abdominal aortic aneurysm) Mother    Heart disease Father 30    Social History   Socioeconomic History   Marital status: Married    Spouse name: Brett [name] now deceased   Number of children: Not on file   Years of education: Not on file   Highest education level: Not on file  Occupational History    Employer: retired  Tobacco Use   Smoking status: Former    Types: Cigars    Quit date: 10/04/1981    Years since quitting: 41.2   Smokeless tobacco: Never  Vaping Use   Vaping status: Never Used  Substance and Sexual Activity   Alcohol use: Yes    Alcohol/week: 1.0 - 2.0 standard drink of alcohol    Types: 1 - 2 Cans of beer per week    Comment: everyday   Drug use: No   Sexual activity: Not Currently  Other Topics Concern   Not on file  Social History Narrative   Not on file   Social Determinants of Health  Financial Resource Strain: Low Risk  (08/11/2021)   Overall Financial Resource Strain (CARDIA)    Difficulty of Paying Living Expenses: Not very hard  Food Insecurity: No Food Insecurity (08/12/2022)   Hunger Vital Sign    Worried About Running Out of Food in the Last Year: Never true    Ran Out of Food in the Last Year: Never true  Transportation Needs: No Transportation Needs (08/12/2022)   PRAPARE - Administrator, Civil Service (Medical): No    Lack of Transportation (Non-Medical): No  Physical Activity: Sufficiently Active (08/12/2022)   Exercise Vital Sign    Days of Exercise per Week: 3 days    Minutes of Exercise per Session: 50 min  Stress: No Stress Concern Present (08/12/2022)   [employer] of Occupational Health -  Occupational Stress Questionnaire    Feeling of Stress : Not at all  Social Connections: Unknown (05/26/2021)   Social Connection and Isolation Panel [NHANES]    Frequency of Communication with Friends and Family: Not on file    Frequency of Social Gatherings with Friends and Family: Not on file    Attends Religious Services: Not on file    Active Member of Clubs or Organizations: Not on file    Attends Banker Meetings: Not on file    Marital Status: Married  Intimate Partner Violence: Not At Risk (08/12/2022)   Humiliation, Afraid, Rape, and Kick questionnaire    Fear of Current or Ex-Partner: No    Emotionally Abused: No    Physically Abused: No    Sexually Abused: No     Outpatient Medications Prior to Visit  Medication Sig Dispense Refill   acetaminophen (TYLENOL) 500 MG tablet Take 1-2 tablets (500-1,000 mg total) by mouth in the morning, at noon, and at bedtime.     aspirin EC 81 MG tablet Take 81 mg by mouth daily.     atorvastatin (LIPITOR) 80 MG tablet TAKE 1 TABLET BY MOUTH EVERY DAY 90 tablet 2   Cholecalciferol (VITAMIN D) 50 MCG (2000 UT) tablet Take 2,000 Units by mouth daily.     DULoxetine (CYMBALTA) 30 MG capsule TAKE 1 CAPSULE BY MOUTH EVERY DAY 90 capsule 1   empagliflozin (JARDIANCE) 25 MG TABS tablet Take 1 tablet (25 mg total) by mouth daily before breakfast. 90 tablet 1   ezetimibe (ZETIA) 10 MG tablet Take 1 tablet (10 mg total) by mouth daily. 90 tablet 2   FREESTYLE LITE test strip CHECK BLOOD SUGAR DAILY AS DIRECTED 100 strip 5   glucose monitoring kit (FREESTYLE) monitoring kit 1 each by Does not apply route as needed for other. For use daily to monitor diabetes.  Please include lancets .  Test once daily   E11.9 1 each 3   Lancets (FREESTYLE) lancets Use as instructed 100 each 12   levothyroxine (SYNTHROID) 100 MCG tablet TAKE 1 TABLET BY MOUTH EVERY DAY IN THE MORNING 30 MINUTES BEFORE FOOD 90 tablet 0   Multiple Vitamin (MULTIVITAMIN) tablet  Take 1 tablet by mouth daily.     cephALEXin (KEFLEX) 500 MG capsule Take 1 capsule (500 mg total) by mouth 4 (four) times daily. 28 capsule 0   No facility-administered medications prior to visit.    Allergies  Allergen Reactions   Metformin And Related     Low appetite     Simvastatin Other (See Comments)   Eszopiclone Rash    ROS Review of Systems  Constitutional:  Negative for chills and  fever.  HENT:  Positive for postnasal drip and rhinorrhea. Negative for sore throat.   Respiratory:  Positive for cough.   Cardiovascular:  Negative for chest pain.  Neurological:  Negative for headaches.   Negative unless indicated in HPI.    Objective:    Physical Exam HENT:     Right Ear: Tympanic membrane normal. Tympanic membrane is not erythematous.     Left Ear: Tympanic membrane normal. Tympanic membrane is not erythematous.     Nose:     Right Turbinates: Not enlarged.     Left Turbinates: Not enlarged.     Right Sinus: No maxillary sinus tenderness or frontal sinus tenderness.     Left Sinus: No maxillary sinus tenderness or frontal sinus tenderness.     Mouth/Throat:     Mouth: Mucous membranes are moist.     Pharynx: No pharyngeal swelling, oropharyngeal exudate or posterior oropharyngeal erythema.     Tonsils: No tonsillar exudate.  Cardiovascular:     Rate and Rhythm: Normal rate and regular rhythm.  Pulmonary:     Effort: Pulmonary effort is normal.     Breath sounds: Normal breath sounds. No stridor. No wheezing.  Neurological:     General: No focal deficit present.     Mental Status: He is oriented to person, place, and time. Mental status is at baseline.  Psychiatric:        Mood and Affect: Mood normal.        Behavior: Behavior normal.        Thought Content: Thought content normal.        Judgment: Judgment normal.     BP 112/60 (BP Location: Left Arm, Patient Position: Sitting, Cuff Size: Normal)   Pulse (!) 56   Temp 98 F (36.7 C) (Oral)   Ht 5'  11" (1.803 m)   Wt 172 lb (78 kg)   SpO2 97%   BMI 23.99 kg/m  Wt Readings from Last 3 Encounters:  12/17/22 172 lb (78 kg)  10/06/22 171 lb (77.6 kg)  08/12/22 175 lb (79.4 kg)     Health Maintenance  Topic Date Due   Colonoscopy  01/24/2022   COVID-19 Vaccine (6 - 2023-24 season) 02/05/2022   Diabetic kidney evaluation - Urine ACR  06/29/2022   HEMOGLOBIN A1C  08/04/2022   Diabetic kidney evaluation - eGFR measurement  02/02/2023   INFLUENZA VACCINE  01/06/2023   DTaP/Tdap/Td (4 - Td or Tdap) 03/17/2023   OPHTHALMOLOGY EXAM  03/25/2023   Medicare Annual Wellness (AWV)  08/12/2023   FOOT EXAM  10/06/2023   Pneumonia Vaccine 52+ Years old  Completed   Zoster Vaccines- Shingrix  Completed   HPV VACCINES  Aged Out   Hepatitis C Screening  Discontinued    There are no preventive care reminders to display for this patient.  Lab Results  Component Value Date   TSH 0.60 02/01/2022   Lab Results  Component Value Date   WBC 6.8 06/29/2021   HGB 13.3 06/29/2021   HCT 41.1 06/29/2021   MCV 93.7 06/29/2021   PLT 205.0 06/29/2021   Lab Results  Component Value Date   NA 144 02/01/2022   K 3.9 02/01/2022   CO2 25 02/01/2022   GLUCOSE 122 (H) 02/01/2022   BUN 22 02/01/2022   CREATININE 1.10 02/01/2022   BILITOT 0.8 02/01/2022   ALKPHOS 57 02/01/2022   AST 27 02/01/2022   ALT 32 02/01/2022   PROT 7.4 02/01/2022   ALBUMIN 4.6 02/01/2022  CALCIUM 9.9 02/01/2022   ANIONGAP 6 06/19/2021   GFR 64.14 02/01/2022   Lab Results  Component Value Date   CHOL 128 02/01/2022   Lab Results  Component Value Date   HDL 57.20 02/01/2022   Lab Results  Component Value Date   LDLCALC 42 02/01/2022   Lab Results  Component Value Date   TRIG 144.0 02/01/2022   Lab Results  Component Value Date   CHOLHDL 2 02/01/2022   Lab Results  Component Value Date   HGBA1C 7.3 (A) 02/01/2022      Assessment & Plan:  Other cough Assessment & Plan: Will treat with Augmentin  twice a day for 10 days. Patient stated he do not want any cough medication at present and would keep [place] posted. Chest x-ray pending.  Orders: -     Amoxicillin-Pot Clavulanate; Take 1 tablet by mouth 2 (two) times daily.  Dispense: 20 tablet; Refill: 0 -     DG Chest 2 View; Future    Follow-up: No follow-ups on file.   Kara Dies, NP

## 2022-12-27 ENCOUNTER — Telehealth: Payer: Self-pay | Admitting: Internal Medicine

## 2022-12-27 NOTE — Telephone Encounter (Signed)
Pt called back. He stated it most likely about his x-ray results. Note under imaging from [facility] NP was read to pt. Pt stated if a nurse can call him back for more clarification.

## 2022-12-28 NOTE — Telephone Encounter (Signed)
LVM to call back to see what questions pt may have

## 2022-12-28 NOTE — Telephone Encounter (Signed)
Noted  

## 2022-12-28 NOTE — Telephone Encounter (Signed)
Patient returned call from Jenate [place], NP.  I let patient know that she is at lunch, but will be available to call him back in about 10 minutes.  Patient states he has spoken with someone regarding his x-ray results and no longer has questions.  I let him know that I will pass the message along to [facility].

## 2023-01-02 DIAGNOSIS — R059 Cough, unspecified: Secondary | ICD-10-CM | POA: Insufficient documentation

## 2023-01-02 NOTE — Assessment & Plan Note (Signed)
Will treat with Augmentin twice a day for 10 days. Patient stated he do not want any cough medication at present and would keep [place] posted. Chest x-ray pending.

## 2023-01-06 ENCOUNTER — Other Ambulatory Visit: Payer: Self-pay | Admitting: Internal Medicine

## 2023-01-11 ENCOUNTER — Ambulatory Visit: Payer: PPO | Admitting: Internal Medicine

## 2023-01-11 ENCOUNTER — Encounter: Payer: Self-pay | Admitting: Internal Medicine

## 2023-01-11 VITALS — BP 112/64 | HR 67 | Temp 97.8°F | Ht 71.0 in | Wt 171.4 lb

## 2023-01-11 DIAGNOSIS — E119 Type 2 diabetes mellitus without complications: Secondary | ICD-10-CM

## 2023-01-11 DIAGNOSIS — Z7984 Long term (current) use of oral hypoglycemic drugs: Secondary | ICD-10-CM

## 2023-01-11 DIAGNOSIS — E785 Hyperlipidemia, unspecified: Secondary | ICD-10-CM

## 2023-01-11 DIAGNOSIS — E11311 Type 2 diabetes mellitus with unspecified diabetic retinopathy with macular edema: Secondary | ICD-10-CM | POA: Diagnosis not present

## 2023-01-11 DIAGNOSIS — D649 Anemia, unspecified: Secondary | ICD-10-CM | POA: Diagnosis not present

## 2023-01-11 DIAGNOSIS — R944 Abnormal results of kidney function studies: Secondary | ICD-10-CM | POA: Diagnosis not present

## 2023-01-11 DIAGNOSIS — R490 Dysphonia: Secondary | ICD-10-CM

## 2023-01-11 DIAGNOSIS — E039 Hypothyroidism, unspecified: Secondary | ICD-10-CM

## 2023-01-11 DIAGNOSIS — G3184 Mild cognitive impairment, so stated: Secondary | ICD-10-CM | POA: Diagnosis not present

## 2023-01-11 DIAGNOSIS — R4189 Other symptoms and signs involving cognitive functions and awareness: Secondary | ICD-10-CM

## 2023-01-11 DIAGNOSIS — R499 Unspecified voice and resonance disorder: Secondary | ICD-10-CM | POA: Diagnosis not present

## 2023-01-11 LAB — POCT GLYCOSYLATED HEMOGLOBIN (HGB A1C): Hemoglobin A1C: 7.5 % — AB (ref 4.0–5.6)

## 2023-01-11 MED ORDER — SITAGLIPTIN PHOSPHATE 50 MG PO TABS: 50.0000 mg | ORAL_TABLET | Freq: Every day | ORAL | 1 refills | Status: DC

## 2023-01-11 NOTE — Assessment & Plan Note (Signed)
Currently  with slight  loss of  on  Jardiance alone.  Adding Januvia   . Patient is reminded to schedule an annual eye exam and foot exam is normal today. Patient has no microalbuminuria. Patient is tolerating statin therapy for CAD risk reduction and on ACE/ARB for renal protection and hypertension    Lab Results  Component Value Date   HGBA1C 7.5 (A) 01/11/2023

## 2023-01-11 NOTE — Patient Instructions (Signed)
You have a slight memory problem. Please check out Dr Margaretmary Eddy program on "[email or web address]"  Adding Januvia  for diabetes control  continue jardiance as well  GO FOR A WALK EVERY DAY.    YOU DID NOT HAVE PNEUMONIA.    I am sending you to [facility] to have your throat examined

## 2023-01-11 NOTE — Progress Notes (Signed)
Subjective:  Patient ID: Brett [name], male    DOB: [birth year]  Age: 82 y.o. 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 Diagnoses of Hypothyroidism, unspecified type, Cognitive decline, Hoarseness, Anemia, unspecified type, Hyperlipidemia LDL goal <70, Change in voice, Diabetes mellitus treated with oral medication (HCC), and Mild cognitive impairment with memory loss were also pertinent to this visit.   HPI Brett [name] presents for  Chief Complaint  Patient presents with   Medical Management of Chronic Issues    diabetes  Ent referral recommended   1) T2DM:  patient was seen in May but did not get the labs that were ordered.  His last A1c was last August.  Not checking sugars,  not following a prudent diet.  Eating ice cream every day (small amount) , several starch servings daily getting [facility]iance from [facility] .  Wife makes him walk  3 times per week for 2 miles (45 minute walk ).  He is very inactive and sleeps 12 hours daily but still falls asleep during the  day.  He does not snore   2) new onset hoarseness : worse since his bronchitis in mid July .  Had a cough but not severe.  Lifelong non smoker. ENT referral accepted    He is accompanied by wife who notes some short term memory loss,  recurrrent questions ,  forgets when he parks the car,  getting lost on the way to places,  trouble remembering the day.       2) Treated empirically in mid July by NP for purulent bronchitis with augmentin.  Chest x ray no infiltrate seen ,        Outpatient Medications Prior to Visit  Medication Sig Dispense Refill   acetaminophen (TYLENOL) 500 MG tablet Take 1-2 tablets (500-1,000 mg total) by mouth in the morning, at noon, and at bedtime.     aspirin EC 81 MG tablet Take 81 mg by mouth daily.      atorvastatin (LIPITOR) 80 MG tablet TAKE 1 TABLET BY MOUTH EVERY DAY 90 tablet 2   Cholecalciferol (VITAMIN D) 50 MCG (2000 UT) tablet Take 2,000 Units by mouth daily.     DULoxetine (CYMBALTA) 30 MG capsule TAKE 1 CAPSULE BY MOUTH EVERY DAY 90 capsule 1   empagliflozin (JARDIANCE) 25 MG TABS tablet Take 1 tablet (25 mg total) by mouth daily before breakfast. 90 tablet 1   ezetimibe (ZETIA) 10 MG tablet Take 1 tablet (10 mg total) by mouth daily. 90 tablet 2   FREESTYLE LITE test strip CHECK BLOOD SUGAR DAILY AS DIRECTED 100 strip 5   glucose monitoring kit (FREESTYLE) monitoring kit 1 each by Does not apply route as needed for other. For use daily to monitor diabetes.  Please include lancets .  Test once daily   E11.9 1 each 3   Lancets (FREESTYLE) lancets Use as instructed 100 each 12   levothyroxine (SYNTHROID) 100 MCG tablet TAKE 1 TABLET BY MOUTH EVERY DAY IN THE MORNING 30 MINUTES BEFORE FOOD 90 tablet 0   Multiple Vitamin (MULTIVITAMIN) tablet Take 1 tablet by mouth daily.     amoxicillin-clavulanate (AUGMENTIN) 875-125 MG tablet Take 1 tablet by mouth 2 (two) times daily. (Patient not taking: Reported on 01/11/2023) 20 tablet 0   No facility-administered medications prior to visit.    Review of  Systems;  Patient denies headache, fevers, malaise, unintentional weight loss, skin rash, eye pain, sinus congestion and sinus pain, sore throat, dysphagia,  hemoptysis , cough, dyspnea, wheezing, chest pain, palpitations, orthopnea, edema, abdominal pain, nausea, melena, diarrhea, constipation, flank pain, dysuria, hematuria, urinary  Frequency, nocturia, numbness, tingling, seizures,  Focal weakness, Loss of consciousness,  Tremor, insomnia, depression, anxiety, and suicidal ideation.      Objective:  BP 112/64   Pulse 67   Temp 97.8 F (36.6 C) (Oral)   Ht 5\' 11"  (1.803 m)   Wt 171 lb 6.4 oz (77.7 kg)   SpO2 97%   BMI 23.91 kg/m   BP Readings from Last 3 Encounters:  01/11/23 112/64   12/17/22 112/60  10/06/22 128/74    Wt Readings from Last 3 Encounters:  01/11/23 171 lb 6.4 oz (77.7 kg)  12/17/22 172 lb (78 kg)  10/06/22 171 lb (77.6 kg)    Physical Exam Vitals reviewed.  Constitutional:      General: He is not in acute distress.    Appearance: Normal appearance. He is normal weight. He is not ill-appearing, toxic-appearing or diaphoretic.  HENT:     Head: Normocephalic.  Eyes:     General: No scleral icterus.       Right eye: No discharge.        Left eye: No discharge.     Conjunctiva/sclera: Conjunctivae normal.  Cardiovascular:     Rate and Rhythm: Normal rate and regular rhythm.     Heart sounds: Normal heart sounds.  Pulmonary:     Effort: Pulmonary effort is normal. No respiratory distress.     Breath sounds: Normal breath sounds.  Musculoskeletal:        General: Normal range of motion.     Cervical back: Normal range of motion.  Skin:    General: Skin is warm and dry.  Neurological:     General: No focal deficit present.     Mental Status: He is alert and oriented to person, place, and time. Mental status is at baseline.     Cranial Nerves: No cranial nerve deficit.     Motor: No weakness.     Comments: MMSE 28/30  Psychiatric:        Mood and Affect: Mood normal.        Behavior: Behavior normal.        Thought Content: Thought content normal.        Judgment: Judgment normal.    Lab Results  Component Value Date   HGBA1C 7.5 (A) 01/11/2023   HGBA1C 7.3 (A) 02/01/2022   HGBA1C 7.6 (A) 10/19/2021    Lab Results  Component Value Date   CREATININE 1.10 02/01/2022   CREATININE 1.27 10/19/2021   CREATININE 0.97 06/29/2021    Lab Results  Component Value Date   WBC 6.8 06/29/2021   HGB 13.3 06/29/2021   HCT 41.1 06/29/2021   PLT 205.0 06/29/2021   GLUCOSE 122 (H) 02/01/2022   CHOL 128 02/01/2022   TRIG 144.0 02/01/2022   HDL 57.20 02/01/2022   LDLDIRECT 49.0 02/01/2022   LDLCALC 42 02/01/2022   ALT 32 02/01/2022    AST 27 02/01/2022   NA 144 02/01/2022   K 3.9 02/01/2022   CL 107 02/01/2022   CREATININE 1.10 02/01/2022   BUN 22 02/01/2022   CO2 25 02/01/2022   TSH 0.60 02/01/2022   PSA 0.42 04/03/2018   HGBA1C 7.5 (A) 01/11/2023   MICROALBUR 0.8 06/29/2021  DG Chest 2 View  Result Date: 12/23/2022 CLINICAL DATA:  Cough and congestion for 3 weeks. EXAM: CHEST - 2 VIEW COMPARISON:  06/18/2021 FINDINGS: Prior median sternotomy. Heart size is normal. RIGHT lung is clear. There is chronic scarring at the LEFT lung base, showing long-term stability. Moderate midthoracic spondylosis. IMPRESSION: Chronic scarring at the LEFT lung base. Electronically Signed   By: Norva Pavlov M.D.   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, without long-term current use of insulin, unspecified retinopathy severity (HCC) -     POCT glycosylated hemoglobin (Hb A1C) -     Microalbumin / creatinine urine ratio -     Comprehensive metabolic panel  Hypothyroidism, unspecified type  Cognitive decline -     B12 and Folate Panel -     TSH  Hoarseness -     Ambulatory referral to ENT  Anemia, unspecified type -     CBC with Differential/Platelet  Hyperlipidemia LDL goal <70 -     LDL cholesterol, direct -     Lipid panel  Change in voice Assessment & Plan: He has had persistent hoarseness for the past month   Referral to ENT for evaluation    Diabetes mellitus treated with oral medication (HCC) Assessment & Plan: Currently  with slight  loss of  on  Jardiance alone.  Adding Januvia   . Patient is reminded to schedule an annual eye exam and foot exam is normal today. Patient has no microalbuminuria. Patient is tolerating statin therapy for CAD risk reduction and on ACE/ARB for renal protection and hypertension    Lab Results  Component Value Date   HGBA1C 7.5 (A) 01/11/2023      Mild cognitive impairment with memory  loss Assessment & Plan: MMSE was 28/30 today.   workup deferred by patient , but he was advised to consider entering Dr Alver Fisher study.  Checking b12/thyroid   Other orders -     SITagliptin Phosphate; Take 1 tablet (50 mg total) by mouth daily.  Dispense: 90 tablet; Refill: 1     I provided 30 minutes of face-to-face time during this encounter reviewing patient's last visit with me, patient's  most recent visit with cardiology,  nephrology,  and neurology,  recent surgical and non surgical procedures, previous  labs and imaging studies, counseling on currently addressed issues,  and post visit ordering to diagnostics and therapeutics .   Follow-up: No follow-ups on file.   Sherlene Shams, MD

## 2023-01-11 NOTE — Assessment & Plan Note (Signed)
He has had persistent hoarseness for the past month   Referral to ENT for evaluation

## 2023-01-11 NOTE — Assessment & Plan Note (Addendum)
Reported by his wife.  His MMSE was 28/30 today.   workup  has been deferred by patient , but he was advised to consider entering Dr Alver Fisher study.  Checking b12/thyroid, all normal

## 2023-01-13 NOTE — Addendum Note (Signed)
Addended by: Sherlene Shams on: 01/13/2023 09:05 PM   Modules accepted: Orders

## 2023-01-19 ENCOUNTER — Other Ambulatory Visit (INDEPENDENT_AMBULATORY_CARE_PROVIDER_SITE_OTHER): Payer: PPO

## 2023-01-19 DIAGNOSIS — R944 Abnormal results of kidney function studies: Secondary | ICD-10-CM | POA: Diagnosis not present

## 2023-01-19 DIAGNOSIS — E11311 Type 2 diabetes mellitus with unspecified diabetic retinopathy with macular edema: Secondary | ICD-10-CM | POA: Diagnosis not present

## 2023-01-20 ENCOUNTER — Telehealth: Payer: Self-pay

## 2023-01-20 LAB — BASIC METABOLIC PANEL
BUN: 22 mg/dL (ref 6–23)
CO2: 28 meq/L (ref 19–32)
Calcium: 9.4 mg/dL (ref 8.4–10.5)
Chloride: 100 meq/L (ref 96–112)
Creatinine, Ser: 1.12 mg/dL (ref 0.40–1.50)
GFR: 62.35 mL/min (ref >60.00)
Glucose, Bld: 142 mg/dL — ABNORMAL HIGH (ref 70–99)
Potassium: 4 meq/L (ref 3.5–5.1)
Sodium: 136 meq/L (ref 135–145)

## 2023-01-20 LAB — HEMOGLOBIN A1C: Hgb A1c MFr Bld: 7.9 % — ABNORMAL HIGH (ref 4.6–6.5)

## 2023-01-20 NOTE — Telephone Encounter (Signed)
[facility] sent a fax back about pt's Diabetic Eye Exam. It stated "No visits since 06/16/2021. Letter sent to Dr. Darrick Huntsman for that visit".

## 2023-02-09 ENCOUNTER — Ambulatory Visit: Payer: PPO | Admitting: Internal Medicine

## 2023-02-09 ENCOUNTER — Encounter: Payer: Self-pay | Admitting: Internal Medicine

## 2023-02-09 VITALS — BP 118/66 | HR 60 | Temp 97.6°F | Ht 71.0 in | Wt 171.2 lb

## 2023-02-09 DIAGNOSIS — Z87898 Personal history of other specified conditions: Secondary | ICD-10-CM

## 2023-02-09 DIAGNOSIS — E039 Hypothyroidism, unspecified: Secondary | ICD-10-CM

## 2023-02-09 DIAGNOSIS — E11311 Type 2 diabetes mellitus with unspecified diabetic retinopathy with macular edema: Secondary | ICD-10-CM

## 2023-02-09 DIAGNOSIS — E785 Hyperlipidemia, unspecified: Secondary | ICD-10-CM | POA: Diagnosis not present

## 2023-02-09 DIAGNOSIS — Z7984 Long term (current) use of oral hypoglycemic drugs: Secondary | ICD-10-CM | POA: Diagnosis not present

## 2023-02-09 DIAGNOSIS — E119 Type 2 diabetes mellitus without complications: Secondary | ICD-10-CM

## 2023-02-09 MED ORDER — GLIPIZIDE 5 MG PO TABS: 2.5000 mg | ORAL_TABLET | Freq: Every day | ORAL | 2 refills | Status: DC

## 2023-02-09 MED ORDER — LEVOTHYROXINE SODIUM 100 MCG PO TABS
ORAL_TABLET | ORAL | 1 refills | Status: DC
Start: 2023-02-09 — End: 2023-06-13

## 2023-02-09 NOTE — Assessment & Plan Note (Signed)
Thyroid function is WNL on current dose of 100 mcg daily .  No current changes needed.    Lab Results  Component Value Date   TSH 1.57 01/11/2023

## 2023-02-09 NOTE — Assessment & Plan Note (Signed)
Now managed with [place] added to jardiance due to loss of control on  Jardiance alone.  Adding Januvia     BS reviewed today note ,. Patient is reminded to schedule an annual eye exam and foot exam is normal today. Patient has no microalbuminuria. Patient is tolerating statin therapy for CAD risk reduction and on ACE/ARB for renal protection and hypertension    Lab Results  Component Value Date   HGBA1C 7.9 (H) 01/19/2023

## 2023-02-09 NOTE — Progress Notes (Unsigned)
Subjective:  Patient ID: Brett [name], male    DOB: 04-Sep-1940  Age: 82 y.o. 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 Diagnoses of Hypothyroidism, unspecified type, Hyperlipidemia LDL goal <70, and Diabetes mellitus treated with oral medication (HCC) were also pertinent to this visit.   HPI Brett [name] presents for  Chief Complaint  Patient presents with   Medical Management of Chronic Issues    Follow up on diabetes   1) Type 2 DM,: loss of control noted at last visit on Jardiance alone.  Januvia was added but not started due to cost . Willing to resume metformin  daughter Marcelino Duster is concerned about his alcohol consumption    Lab Results  Component Value Date   HGBA1C 7.9 (H) 01/19/2023      Outpatient Medications Prior to Visit  Medication Sig Dispense Refill   acetaminophen (TYLENOL) 500 MG tablet Take 1-2 tablets (500-1,000 mg total) by mouth in the morning, at noon, and at bedtime.     aspirin EC 81 MG tablet Take 81 mg by mouth daily.     atorvastatin (LIPITOR) 80 MG tablet TAKE 1 TABLET BY MOUTH EVERY DAY 90 tablet 2   Cholecalciferol (VITAMIN D) 50 MCG (2000 UT) tablet Take 2,000 Units by mouth daily.     DULoxetine (CYMBALTA) 30 MG capsule TAKE 1 CAPSULE BY MOUTH EVERY DAY 90 capsule 1   empagliflozin (JARDIANCE) 25 MG TABS tablet Take 1 tablet (25 mg total) by mouth daily before breakfast. 90 tablet 1   ezetimibe (ZETIA) 10 MG tablet Take 1 tablet (10 mg total) by mouth daily. 90 tablet 2   FREESTYLE LITE test strip CHECK BLOOD SUGAR DAILY AS DIRECTED 100 strip 5   glucose monitoring kit (FREESTYLE) monitoring kit 1 each by Does not apply route as needed for other. For use daily to monitor diabetes.  Please include lancets .  Test once daily   E11.9 1 each 3   Lancets (FREESTYLE) lancets Use  as instructed 100 each 12   levothyroxine (SYNTHROID) 100 MCG tablet TAKE 1 TABLET BY MOUTH EVERY DAY IN THE MORNING 30 MINUTES BEFORE FOOD 90 tablet 0   Multiple Vitamin (MULTIVITAMIN) tablet Take 1 tablet by mouth daily.     sitaGLIPtin (JANUVIA) 50 MG tablet Take 1 tablet (50 mg total) by mouth daily. (Patient not taking: Reported on 02/09/2023) 90 tablet 1   No facility-administered medications prior to visit.    Review of Systems;  Patient denies headache, fevers, malaise, unintentional weight loss, skin rash, eye pain, sinus congestion and sinus pain, sore throat, dysphagia,  hemoptysis , cough, dyspnea, wheezing, chest pain, palpitations, orthopnea, edema, abdominal pain, nausea, melena, diarrhea, constipation, flank pain, dysuria, hematuria, urinary  Frequency, nocturia, numbness, tingling, seizures,  Focal weakness, Loss of consciousness,  Tremor, insomnia, depression, anxiety, and suicidal ideation.      Objective:  BP 118/66   Pulse 60   Temp 97.6 F (36.4 C) (Oral)   Ht 5\' 11"  (1.803 m)   Wt 171 lb 3.2 oz (77.7 kg)   SpO2 95%   BMI 23.88 kg/m   BP Readings from Last 3 Encounters:  02/09/23 118/66  01/11/23 112/64  12/17/22 112/60    Wt Readings from Last 3 Encounters:  02/09/23 171 lb 3.2 oz (77.7 kg)  01/11/23 171 lb 6.4 oz (77.7 kg)  12/17/22  172 lb (78 kg)    Physical Exam  Lab Results  Component Value Date   HGBA1C 7.9 (H) 01/19/2023   HGBA1C 7.5 (A) 01/11/2023   HGBA1C 7.3 (A) 02/01/2022    Lab Results  Component Value Date   CREATININE 1.12 01/19/2023   CREATININE 1.19 01/11/2023   CREATININE 1.10 02/01/2022    Lab Results  Component Value Date   WBC 6.6 01/11/2023   HGB 14.4 01/11/2023   HCT 44.2 01/11/2023   PLT 183.0 01/11/2023   GLUCOSE 142 (H) 01/19/2023   CHOL 125 01/11/2023   TRIG 186.0 (H) 01/11/2023   HDL 52.50 01/11/2023   LDLDIRECT 59.0 01/11/2023   LDLCALC 35 01/11/2023   ALT 32 01/11/2023   AST 27 01/11/2023   NA 136  01/19/2023   K 4.0 01/19/2023   CL 100 01/19/2023   CREATININE 1.12 01/19/2023   BUN 22 01/19/2023   CO2 28 01/19/2023   TSH 1.57 01/11/2023   PSA 0.42 04/03/2018   HGBA1C 7.9 (H) 01/19/2023   MICROALBUR <0.7 01/11/2023    DG Chest 2 View  Result Date: 12/23/2022 CLINICAL DATA:  Cough and congestion for 3 weeks. EXAM: CHEST - 2 VIEW COMPARISON:  06/18/2021 FINDINGS: Prior median sternotomy. Heart size is normal. RIGHT lung is clear. There is chronic scarring at the LEFT lung base, showing long-term stability. Moderate midthoracic spondylosis. IMPRESSION: Chronic scarring at the LEFT lung base. Electronically Signed   By: Norva Pavlov M.D.   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  No current changes needed.    Lab Results  Component Value Date   TSH 1.57 01/11/2023      Hyperlipidemia LDL goal <70  Diabetes mellitus treated with oral medication [facility]) Assessment & Plan: Now managed with [place] added to jardiance due to loss of control on  Jardiance alone.  Adding Januvia     BS reviewed today note ,. Patient is reminded to schedule an annual eye exam and foot exam is normal today. Patient has no microalbuminuria. Patient is tolerating statin therapy for CAD risk reduction and on ACE/ARB for renal protection and hypertension    Lab Results  Component Value Date   HGBA1C 7.9 (H) 01/19/2023         I provided 30 minutes of face-to-face time during this encounter reviewing patient's last visit with me, patient's  most recent visit with cardiology,  nephrology,  and neurology,  recent surgical and non surgical procedures, previous  labs and imaging studies, counseling on currently addressed  issues,  and post visit ordering to diagnostics and therapeutics .   Follow-up: No follow-ups on file.   Sherlene Shams, MD

## 2023-02-09 NOTE — Patient Instructions (Addendum)
  I highly recommend reading "the End of Alzheimer's: The First Program to Prevent and Reverse Cognitive Decline"  by Dorice Lamas, MD     For the diabetes,  continue Jardiance.  NO JANUVIA.   Starting glipizide AT THE 25  mg DOSE.  Take with breakfast.  Follow his  post prandial sugars ( 2 hrs after )  and send me them  on Mychart  in one month  RETURN IN 3 MONTHS  THERE ARE PREPARED FOODS THAT ARE WORTH GETTING:   You might want to try a premixed protein drink called Premier Protein shake for breakfast or late night snack . It is great tasting,   very low sugar and available of < $2 serving at [facility] and  In bulk for $1.50/serving at [employer] and [employer]  .    Nutritional analysis :  160 cal  30 g protein  1 g sugar 50% calcium needs   Nicolette Bang and [employer]  Vilma Meckel now makes a frozen breakfast frittata that can be microwaved in 2 minutes and is very low carb. Frittatas are similar to quiches without the crust   [employer]egg[employer] Made Great ! Makes a low carb spinach & egg white frittata      [employer] "low carb power bowl"  entrees and  "Steamer" entrees are are great low carb entrees that microwave in 5 minutes

## 2023-02-10 NOTE — Assessment & Plan Note (Addendum)
Occurred in early Jan 2023.  ECHO and telemetry unrevealing/non diagnostic per cardiology evaluation .  No recent events.

## 2023-04-16 ENCOUNTER — Other Ambulatory Visit: Payer: Self-pay | Admitting: Cardiovascular Disease

## 2023-04-25 ENCOUNTER — Encounter: Payer: Self-pay | Admitting: Family Medicine

## 2023-04-25 ENCOUNTER — Ambulatory Visit (INDEPENDENT_AMBULATORY_CARE_PROVIDER_SITE_OTHER): Payer: PPO | Admitting: Family Medicine

## 2023-04-25 VITALS — BP 132/70 | HR 60 | Temp 98.0°F | Ht 71.0 in | Wt 179.5 lb

## 2023-04-25 DIAGNOSIS — J189 Pneumonia, unspecified organism: Secondary | ICD-10-CM

## 2023-04-25 DIAGNOSIS — R051 Acute cough: Secondary | ICD-10-CM

## 2023-04-25 LAB — POC COVID19 BINAXNOW: SARS Coronavirus 2 Ag: NEGATIVE

## 2023-04-25 MED ORDER — DOXYCYCLINE HYCLATE 100 MG PO TABS: 100.0000 mg | ORAL_TABLET | Freq: Two times a day (BID) | ORAL | 0 refills | Status: DC

## 2023-04-25 MED ORDER — BENZONATATE 200 MG PO CAPS: 200.0000 mg | ORAL_CAPSULE | Freq: Three times a day (TID) | ORAL | 1 refills | Status: DC | PRN

## 2023-04-25 NOTE — Progress Notes (Unsigned)
Persis Graffius T. Ashlie Mcmenamy, MD, CAQ Sports Medicine [facility] at [facility] [address] [place], [address]  Phone: [phone]  FAX: [phone]  Brett [name] - 82 y.o. male  MRN [id number]  Date of Birth: 12-14-1940  Date: 04/25/2023  PCP: Sherlene Shams, MD  Referral: Sherlene Shams, MD  Chief Complaint  Patient presents with   Cough    X  1 week   Subjective:   Brett [name] is a 82 y.o. very pleasant male patient with Body mass index is 25.04 kg/m. who presents with the following:  He is a pleasant gentleman who is a new patient to me, and his primary care doctor is Dr. Darrick Huntsman at [facility].  History is significant for coronary disease, diabetes, and he declines any knowledge of prior pulmonary disease.  Roughly for the last 7 days he is having some progressive cough and productive cough of discolored sputum.  He declines any history of fever.  No body aches, chills, arthralgias at all, headache, sinus pain, dysuria, urgency, or any other systemic symptoms.  Non-smoker  Review of Systems is noted in the HPI, as appropriate  Objective:   BP 132/70 (BP Location: Right Arm, Patient Position: Sitting, Cuff Size: Large)   Pulse 60   Temp 98 F (36.7 C) (Temporal)   Ht 5\' 11"  (1.803 m)   Wt 179 lb 8 oz (81.4 kg)   SpO2 98%   BMI 25.04 kg/m    Gen: WDWN, NAD. Globally Non-toxic HEENT: Throat clear, w/o exudate, R TM clear, L TM - good landmarks, No fluid present. No rhinnorhea.  MMM Frontal sinuses: NT Max sinuses: NT NECK: Anterior cervical  LAD is absent CV: RRR, No M/G/R, cap refill <2 sec PULM: Breathing comfortably in no respiratory distress.  He does not have any wheezing but he does have crackles in the bilateral lung bases.  Laboratory and Imaging Data:  Assessment and Plan:     ICD-10-CM   1. Community acquired pneumonia of both lower lobes  J18.9     2. Acute cough  R05.1 POC COVID-19     Crackles bilateral lower  lobes bilaterally, concern for possible community-acquired pneumonia.  We will plan on treating this as such with oral antibiotics.  He is also having issues with coughing, so he is going to try some [employer].  Medication Management during today's office visit: Meds ordered this encounter  Medications   doxycycline (VIBRA-TABS) 100 MG tablet    Sig: Take 1 tablet (100 mg total) by mouth 2 (two) times daily.    Dispense:  20 tablet    Refill:  0   benzonatate (TESSALON) 200 MG capsule    Sig: Take 1 capsule (200 mg total) by mouth 3 (three) times daily as needed for cough.    Dispense:  40 capsule    Refill:  1   There are no discontinued medications.  Orders placed today for conditions managed today: Orders Placed This Encounter  Procedures   POC COVID-19    Disposition: No follow-ups on file.  Dragon [employer] One speech-to-text software was used for transcription in this dictation.  Possible transcriptional errors can occur using Animal nutritionist.   Signed,  Elpidio Galea. Jacquese Cassarino, MD   Outpatient Encounter Medications as of 04/25/2023  Medication Sig   acetaminophen (TYLENOL) 500 MG tablet Take 1-2 tablets (500-1,000 mg total) by mouth in the morning, at noon, and at bedtime.   aspirin EC 81 MG tablet  Take 81 mg by mouth daily.   atorvastatin (LIPITOR) 80 MG tablet TAKE 1 TABLET BY MOUTH EVERY DAY   benzonatate (TESSALON) 200 MG capsule Take 1 capsule (200 mg total) by mouth 3 (three) times daily as needed for cough.   Cholecalciferol (VITAMIN D) 50 MCG (2000 UT) tablet Take 2,000 Units by mouth daily.   doxycycline (VIBRA-TABS) 100 MG tablet Take 1 tablet (100 mg total) by mouth 2 (two) times daily.   DULoxetine (CYMBALTA) 30 MG capsule TAKE 1 CAPSULE BY MOUTH EVERY DAY   empagliflozin (JARDIANCE) 25 MG TABS tablet Take 1 tablet (25 mg total) by mouth daily before breakfast.   ezetimibe (ZETIA) 10 MG tablet TAKE 1 TABLET BY MOUTH EVERY DAY   FREESTYLE LITE test strip  CHECK BLOOD SUGAR DAILY AS DIRECTED   glipiZIDE (GLUCOTROL) 5 MG tablet Take 0.5 tablets (2.5 mg total) by mouth daily before breakfast.   glucose monitoring kit (FREESTYLE) monitoring kit 1 each by Does not apply route as needed for other. For use daily to monitor diabetes.  Please include lancets .  Test once daily   E11.9   Lancets (FREESTYLE) lancets Use as instructed   levothyroxine (SYNTHROID) 100 MCG tablet TAKE 1 TABLET BY MOUTH EVERY DAY IN THE MORNING 30 MINUTES BEFORE FOOD   Multiple Vitamin (MULTIVITAMIN) tablet Take 1 tablet by mouth daily.   No facility-administered encounter medications on file as of 04/25/2023.

## 2023-05-11 ENCOUNTER — Encounter: Payer: Self-pay | Admitting: Internal Medicine

## 2023-05-11 ENCOUNTER — Ambulatory Visit: Payer: PPO | Admitting: Internal Medicine

## 2023-05-11 VITALS — BP 124/68 | HR 65 | Ht 71.0 in | Wt 180.6 lb

## 2023-05-11 DIAGNOSIS — E785 Hyperlipidemia, unspecified: Secondary | ICD-10-CM | POA: Diagnosis not present

## 2023-05-11 DIAGNOSIS — R9389 Abnormal findings on diagnostic imaging of other specified body structures: Secondary | ICD-10-CM | POA: Insufficient documentation

## 2023-05-11 DIAGNOSIS — E119 Type 2 diabetes mellitus without complications: Secondary | ICD-10-CM

## 2023-05-11 DIAGNOSIS — Z7984 Long term (current) use of oral hypoglycemic drugs: Secondary | ICD-10-CM | POA: Diagnosis not present

## 2023-05-11 DIAGNOSIS — E11311 Type 2 diabetes mellitus with unspecified diabetic retinopathy with macular edema: Secondary | ICD-10-CM | POA: Diagnosis not present

## 2023-05-11 LAB — POCT GLYCOSYLATED HEMOGLOBIN (HGB A1C): Hemoglobin A1C: 6.9 % — AB (ref 4.0–5.6)

## 2023-05-11 LAB — GLUCOSE, POCT (MANUAL RESULT ENTRY): POC Glucose: 170 mg/dL — AB (ref 70–99)

## 2023-05-11 NOTE — Assessment & Plan Note (Signed)
Improved control with additional of low dose sulfonylurea to SGLT 2  inhibitor. .  A1c and 2 hr post prandial CBG done today .  Diet reviewed ;  advised to walk after his cereal breakfast   Lab Results  Component Value Date   HGBA1C 6.9 (A) 05/11/2023

## 2023-05-11 NOTE — Patient Instructions (Addendum)
Your diabetes control is improving  .  Continue taking glipizide 2.5 mg  once a day in the morning,  and Jardiance  Regarding your diet:  More egg breakfasts,  less cereal /bananas!    Go for a walk on the days you eat cereal for breakfast  to help lower your sugars    You are due for your tetanus-diptheria-pertussis vaccine   (TDaP)   You can get it at your pharmacyisi   Please consider getting the RSV vaccine  as well to protect your lungs.

## 2023-05-11 NOTE — Progress Notes (Signed)
Subjective:  Patient ID: Brett [name], male    DOB: Jun 17, 1940  Age: 82 y.o. MRN: [id number]  CC: The primary encounter diagnosis was Abnormal chest xray. 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   HPI Brett [name] presents for  Chief Complaint  Patient presents with   Medical Management of Chronic Issues   1) type 2 Diabetes,  recent loss of control noted in August with increase in A1c to 7.9   .   At last visit he was noted to have high post prandial readings and normal fasting readings.  Glipizide was started at 2.5 mg dose and jardiance was continued.  Today he states that is tolerating the glipizide . Fasting are < 130  but still having post prandials have not been done correctly (too early) .  Diet reviewed  :  for brekfast he eats  eggs 3 times week  however on the other 4 days he eats frosted shredded mini wheats .   Today he ate eggs  and a full ripe banana 2 hours ago:  'cbg is 170     A1c 6.9    2) recent treatment for CAP Nov 18:  presented to Dr Karleen Hampshire with 1 week history of productive cough.  Exam was notable for LLL crackles.  Rx doxycycline and tessalon . Symptoms have completely resolved    Outpatient Medications Prior to Visit  Medication Sig Dispense Refill   acetaminophen (TYLENOL) 500 MG tablet Take 1-2 tablets (500-1,000 mg total) by mouth in the morning, at noon, and at bedtime.     aspirin EC 81 MG tablet Take 81 mg by mouth daily.     atorvastatin (LIPITOR) 80 MG tablet TAKE 1 TABLET BY MOUTH EVERY DAY 90 tablet 2   Cholecalciferol (VITAMIN D) 50 MCG (2000 UT) tablet Take 2,000 Units by mouth daily.     DULoxetine (CYMBALTA) 30 MG capsule TAKE 1 CAPSULE BY MOUTH EVERY DAY 90 capsule 1   empagliflozin  (JARDIANCE) 25 MG TABS tablet Take 1 tablet (25 mg total) by mouth daily before breakfast. 90 tablet 1   ezetimibe (ZETIA) 10 MG tablet TAKE 1 TABLET BY MOUTH EVERY DAY 90 tablet 0   FREESTYLE LITE test strip CHECK BLOOD SUGAR DAILY AS DIRECTED 100 strip 5   glipiZIDE (GLUCOTROL) 5 MG tablet Take 0.5 tablets (2.5 mg total) by mouth daily before breakfast. 45 tablet 2   glucose monitoring kit (FREESTYLE) monitoring kit 1 each by Does not apply route as needed for other. For use daily to monitor diabetes.  Please include lancets .  Test once daily   E11.9 1 each 3   Lancets (FREESTYLE) lancets Use as instructed 100 each 12   levothyroxine (SYNTHROID) 100 MCG tablet TAKE 1 TABLET BY MOUTH EVERY DAY IN THE MORNING 30 MINUTES BEFORE FOOD 90 tablet 1   Multiple Vitamin (MULTIVITAMIN) tablet Take 1 tablet by mouth daily.     benzonatate (TESSALON) 200 MG capsule Take 1 capsule (200 mg total) by mouth 3 (three) times daily as needed for cough. (Patient not taking: Reported on 05/11/2023) 40 capsule 1   doxycycline (VIBRA-TABS) 100 MG tablet Take 1 tablet (100 mg total) by mouth 2 (two) times daily. (Patient not taking: Reported on 05/11/2023) 20 tablet 0   No  facility-administered medications prior to visit.    Review of Systems;  Patient denies headache, fevers, malaise, unintentional weight loss, skin rash, eye pain, sinus congestion and sinus pain, sore throat, dysphagia,  hemoptysis , cough, dyspnea, wheezing, chest pain, palpitations, orthopnea, edema, abdominal pain, nausea, melena, diarrhea, constipation, flank pain, dysuria, hematuria, urinary  Frequency, nocturia, numbness, tingling, seizures,  Focal weakness, Loss of consciousness,  Tremor, insomnia, depression, anxiety, and suicidal ideation.      Objective:  BP 124/68   Pulse 65   Ht 5\' 11"  (1.803 m)   Wt 180 lb 9.6 oz (81.9 kg)   SpO2 95%   BMI 25.19 kg/m   BP Readings from Last 3 Encounters:  05/11/23 124/68  04/25/23 132/70   02/09/23 118/66    Wt Readings from Last 3 Encounters:  05/11/23 180 lb 9.6 oz (81.9 kg)  04/25/23 179 lb 8 oz (81.4 kg)  02/09/23 171 lb 3.2 oz (77.7 kg)    Physical Exam Vitals reviewed.  Constitutional:      General: He is not in acute distress.    Appearance: Normal appearance. He is normal weight. He is not ill-appearing, toxic-appearing or diaphoretic.  HENT:     Head: Normocephalic.  Eyes:     General: No scleral icterus.       Right eye: No discharge.        Left eye: No discharge.     Conjunctiva/sclera: Conjunctivae normal.  Cardiovascular:     Rate and Rhythm: Normal rate and regular rhythm.     Heart sounds: Normal heart sounds.  Pulmonary:     Effort: Pulmonary effort is normal. No respiratory distress.     Breath sounds: Normal air entry. Examination of the left-lower field reveals rales. Rales present.    Musculoskeletal:        General: Normal range of motion.     Cervical back: Normal range of motion.  Skin:    General: Skin is warm and dry.  Neurological:     General: No focal deficit present.     Mental Status: He is alert and oriented to person, place, and time. Mental status is at baseline.  Psychiatric:        Mood and Affect: Mood normal.        Behavior: Behavior normal.        Thought Content: Thought content normal.        Judgment: Judgment normal.     Lab Results  Component Value Date   HGBA1C 6.9 (A) 05/11/2023   HGBA1C 7.9 (H) 01/19/2023   HGBA1C 7.5 (A) 01/11/2023    Lab Results  Component Value Date   CREATININE 1.12 01/19/2023   CREATININE 1.19 01/11/2023   CREATININE 1.10 02/01/2022    Lab Results  Component Value Date   WBC 6.6 01/11/2023   HGB 14.4 01/11/2023   HCT 44.2 01/11/2023   PLT 183.0 01/11/2023   GLUCOSE 142 (H) 01/19/2023   CHOL 125 01/11/2023   TRIG 186.0 (H) 01/11/2023   HDL 52.50 01/11/2023   LDLDIRECT 59.0 01/11/2023   LDLCALC 35 01/11/2023   ALT 32 01/11/2023   AST 27 01/11/2023   NA 136  01/19/2023   K 4.0 01/19/2023   CL 100 01/19/2023   CREATININE 1.12 01/19/2023   BUN 22 01/19/2023   CO2 28 01/19/2023   TSH 1.57 01/11/2023   PSA 0.42 04/03/2018   HGBA1C 6.9 (A) 05/11/2023   MICROALBUR <0.7 01/11/2023    DG Chest 2 View  Result Date: 12/23/2022 CLINICAL DATA:  Cough and congestion for 3 weeks. EXAM: CHEST - 2 VIEW COMPARISON:  06/18/2021 FINDINGS: Prior median sternotomy. Heart size is normal. RIGHT lung is clear. There is chronic scarring at the LEFT lung base, showing long-term stability. Moderate midthoracic spondylosis. IMPRESSION: Chronic scarring at the LEFT lung base. Electronically Signed   By: Norva Pavlov M.D.   On: 12/23/2022 16:47    Assessment & Plan:  .Abnormal chest xray Assessment & Plan: He has scarring of the LLL noted on July 2024 chest x ray.  He has no  residual symptoms today from the lower respiratory infection treated with doxycycline in mid November.    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 .  A1c and 2 hr post prandial CBG done today .  Diet reviewed ;  advised to walk after his cereal breakfast   Lab Results  Component Value Date   HGBA1C 6.9 (A) 05/11/2023         I provided 30 minutes of face-to-face time during this encounter reviewing patient's last visit with me, patient's  most recent visit with Dr Karleen Hampshire , previous  labs and imaging studies, counseling on currently addressed issues,  and post visit ordering to diagnostics and therapeutics .    Follow-up: Return in about 3 months (around 08/10/2023) for follow up diabetes.   Sherlene Shams, MD

## 2023-05-11 NOTE — Addendum Note (Signed)
Addended by: Warden Fillers on: 05/11/2023 12:13 PM   Modules accepted: Orders

## 2023-05-11 NOTE — Assessment & Plan Note (Signed)
He has scarring of the LLL noted on July 2024 chest x ray.  He has no  residual symptoms today from the lower respiratory infection treated with doxycycline in mid November.

## 2023-06-10 ENCOUNTER — Other Ambulatory Visit: Payer: Self-pay | Admitting: Internal Medicine

## 2023-06-10 DIAGNOSIS — E039 Hypothyroidism, unspecified: Secondary | ICD-10-CM

## 2023-07-08 ENCOUNTER — Other Ambulatory Visit: Payer: Self-pay | Admitting: Cardiovascular Disease

## 2023-08-02 ENCOUNTER — Ambulatory Visit: Payer: PPO | Admitting: Family

## 2023-08-03 ENCOUNTER — Telehealth: Payer: Self-pay

## 2023-08-03 ENCOUNTER — Encounter: Payer: Self-pay | Admitting: Internal Medicine

## 2023-08-03 ENCOUNTER — Ambulatory Visit (INDEPENDENT_AMBULATORY_CARE_PROVIDER_SITE_OTHER): Payer: HMO | Admitting: Internal Medicine

## 2023-08-03 VITALS — BP 120/68 | HR 63 | Temp 98.3°F | Ht 71.0 in | Wt 180.8 lb

## 2023-08-03 DIAGNOSIS — R531 Weakness: Secondary | ICD-10-CM | POA: Diagnosis not present

## 2023-08-03 DIAGNOSIS — J069 Acute upper respiratory infection, unspecified: Secondary | ICD-10-CM | POA: Insufficient documentation

## 2023-08-03 DIAGNOSIS — R059 Cough, unspecified: Secondary | ICD-10-CM

## 2023-08-03 DIAGNOSIS — R0989 Other specified symptoms and signs involving the circulatory and respiratory systems: Secondary | ICD-10-CM

## 2023-08-03 LAB — POCT INFLUENZA A/B
Influenza A, POC: NEGATIVE
Influenza B, POC: NEGATIVE

## 2023-08-03 LAB — POC COVID19 BINAXNOW: SARS Coronavirus 2 Ag: NEGATIVE

## 2023-08-03 MED ORDER — BENZONATATE 100 MG PO CAPS: 100.0000 mg | ORAL_CAPSULE | Freq: Two times a day (BID) | ORAL | 0 refills | Status: DC | PRN

## 2023-08-03 NOTE — Assessment & Plan Note (Addendum)
-  Patient presented today with chest congestion and cough with minimal grayish-green phlegm -No fevers or chills, no shortness of breath, no sinus congestion, no sore throat -I suspect that the patient likely has a viral URI -Flu and COVID test were negative -However, given his symptoms and last improved within a week and has persistent weakness and malaise I will obtain a chest x-ray to rule out a developing pneumonia -Tessalon Perles were sent to his pharmacy to help with this cough -Will continue with Mucinex -No further workup at this time

## 2023-08-03 NOTE — Telephone Encounter (Signed)
-----   Message from Earl Lagos sent at 08/03/2023  2:42 PM EST ----- Regarding: X ray Hello all, I have put in a stat chest x-ray for this patient.  They states that they would like to have it done here tomorrow if possible.  I have currently put it in for [facility].  Can you contact the patient tomorrow to let them know if they can get it done here or if they need to go to [facility]  Thank you, Dr. Heide Spark

## 2023-08-03 NOTE — Patient Instructions (Addendum)
-  It was a pleasure meeting you today -I suspect you likely have a viral upper respiratory tract infection causing your cough -Your flu and COVID tests are negative -I will put in a chest x-ray for you.  Please get the x-ray tomorrow.  If it does show developing pneumonia I will call in some antibiotics for you -I have ordered Tessalon Perles and sent that into the pharmacy for you to help with your cough -Continue with Mucinex as well for the cough -Please keep yourself well-hydrated -If you develop any shortness of breath or fevers or chills or worsening symptoms please contact us immediately for follow-up -Please contact us you have any questions

## 2023-08-03 NOTE — Progress Notes (Signed)
 Acute Office Visit  Subjective:     Patient ID: Brett [name], male    DOB: 1941-03-27, 83 y.o.   MRN: [id number]  Chief Complaint  Patient presents with   Acute Visit    Chest congestion, cough & weak x 1 week    HPI Patient is in today for persistent cough over the last week.  Patient states that approximately 1 week ago he developed chest congestion with associated cough.  Cough is minimally productive with grayish-white sputum.  No hemoptysis noted.  Patient denies any shortness of breath.  He does complain of associated malaise and weakness.  No sinus congestion, no sore throat, no fevers or chills, no chest pain.  Patient did have a slight headache.  He has been taking Mucinex at home with minimal improvement in his symptoms.  He does have a sick contact at home (his wife was ill with low-grade fevers and cough for 2 days).  Review of Systems  Constitutional:  Positive for malaise/fatigue. Negative for chills and fever.  Respiratory:  Positive for cough and sputum production. Negative for hemoptysis, shortness of breath and wheezing.   Cardiovascular: Negative.   Gastrointestinal: Negative.  Negative for abdominal pain, diarrhea and nausea.        Objective:    BP 120/68   Pulse 63   Temp 98.3 F (36.8 C)   Ht 5\' 11"  (1.803 m)   Wt 180 lb 12.8 oz (82 kg)   SpO2 95%   BMI 25.22 kg/m    Physical Exam Constitutional:      Appearance: Normal appearance.  HENT:     Head: Normocephalic and atraumatic.     Nose: No congestion or rhinorrhea.     Mouth/Throat:     Mouth: Mucous membranes are moist.     Pharynx: Oropharynx is clear. No oropharyngeal exudate.  Cardiovascular:     Rate and Rhythm: Normal rate and regular rhythm.     Heart sounds: Normal heart sounds.  Pulmonary:     Effort: Pulmonary effort is normal.     Breath sounds: Normal breath sounds. No wheezing, rhonchi or rales.  Musculoskeletal:     Cervical back: Neck supple.  Lymphadenopathy:      Cervical: No cervical adenopathy.  Neurological:     Mental Status: He is alert.  Psychiatric:        Mood and Affect: Mood normal.        Behavior: Behavior normal.     Results for orders placed or performed in visit on 08/03/23  POC COVID-19 BinaxNow  Result Value Ref Range   SARS Coronavirus 2 Ag Negative Negative  POCT Influenza A/B  Result Value Ref Range   Influenza A, POC Negative Negative   Influenza B, POC Negative Negative        Assessment & Plan:   Problem List Items Addressed This Visit       Respiratory   Viral URI with cough   -Patient presented today with chest congestion and cough with minimal grayish-green phlegm -No fevers or chills, no shortness of breath, no sinus congestion, no sore throat -I suspect that the patient likely has a viral URI -Flu and COVID test were negative -However, given his symptoms and last improved within a week and has persistent weakness and malaise I will obtain a chest x-ray to rule out a developing pneumonia -Tessalon Perles were sent to his pharmacy to help with this cough -Will continue with Mucinex -No further workup at this time  Relevant Medications   benzonatate (TESSALON) 100 MG capsule   Other Visit Diagnoses       Cough, unspecified type    -  Primary   Relevant Orders   POC COVID-19 BinaxNow (Completed)   POCT Influenza A/B (Completed)     Chest congestion       Relevant Orders   POC COVID-19 BinaxNow (Completed)   POCT Influenza A/B (Completed)   DG Chest 2 View     Weak       Relevant Orders   POC COVID-19 BinaxNow (Completed)   POCT Influenza A/B (Completed)       Meds ordered this encounter  Medications   benzonatate (TESSALON) 100 MG capsule    Sig: Take 1 capsule (100 mg total) by mouth 2 (two) times daily as needed for cough.    Dispense:  20 capsule    Refill:  0    No follow-ups on file.  Earl Lagos, MD

## 2023-08-04 ENCOUNTER — Other Ambulatory Visit: Payer: HMO

## 2023-08-04 ENCOUNTER — Ambulatory Visit (INDEPENDENT_AMBULATORY_CARE_PROVIDER_SITE_OTHER): Payer: HMO

## 2023-08-04 DIAGNOSIS — J929 Pleural plaque without asbestos: Secondary | ICD-10-CM | POA: Diagnosis not present

## 2023-08-04 DIAGNOSIS — R0989 Other specified symptoms and signs involving the circulatory and respiratory systems: Secondary | ICD-10-CM

## 2023-08-04 NOTE — Telephone Encounter (Signed)
 Patient is scheduled for the chest xray at 11:30 today.

## 2023-08-04 NOTE — Addendum Note (Signed)
 Addended by: Francene Boyers on: 08/04/2023 08:38 AM   Modules accepted: Orders

## 2023-08-05 ENCOUNTER — Telehealth: Payer: Self-pay

## 2023-08-05 NOTE — Telephone Encounter (Signed)
 Copied from CRM [id number]. Topic: Clinical - Lab/Test Results >> Aug 05, 2023  1:11 PM Brett [name] wrote: Reason for CRM: Pt called to follow up on chest xray results. Pt is requesting a call back pt call pt at [phone]

## 2023-08-05 NOTE — Telephone Encounter (Signed)
 Spoke with Patient to let him know that we have not received the chest xray results but when the results come in then someone will be in touch with him.

## 2023-08-08 ENCOUNTER — Telehealth: Payer: Self-pay

## 2023-08-08 NOTE — Telephone Encounter (Signed)
 error

## 2023-08-08 NOTE — Telephone Encounter (Signed)
 I tried to call the patient to let him know his X ray shoed no evidence of infection. No one picked up. Can you let him know the results and ask him how he is feeling?

## 2023-08-08 NOTE — Telephone Encounter (Signed)
 Spoke to the wife due to the Patient is still sleeping. Wife states Patient started feeling a good bit better yesterday.

## 2023-08-10 ENCOUNTER — Ambulatory Visit: Payer: PPO | Admitting: Internal Medicine

## 2023-08-15 ENCOUNTER — Telehealth: Payer: Self-pay | Admitting: Internal Medicine

## 2023-08-15 ENCOUNTER — Ambulatory Visit (INDEPENDENT_AMBULATORY_CARE_PROVIDER_SITE_OTHER): Payer: PPO | Admitting: *Deleted

## 2023-08-15 VITALS — Ht 71.0 in | Wt 184.0 lb

## 2023-08-15 DIAGNOSIS — Z0000 Encounter for general adult medical examination without abnormal findings: Secondary | ICD-10-CM

## 2023-08-15 NOTE — Telephone Encounter (Signed)
 I called the patient to follow-up and see how he was doing.  Patient states that his symptoms have resolved and he is back at his baseline.  His chest x-ray showed no evidence of infection either.  No further workup at this time.  Patient expressed understanding and agreement with plan.

## 2023-08-15 NOTE — Progress Notes (Signed)
 Subjective:   Brett [name] is a 83 y.o. who presents for a Medicare Wellness preventive visit.  Visit Complete: Virtual I connected with  Brett [name] on 08/15/23 by a audio enabled telemedicine application and verified that I am speaking with the correct person using two identifiers.  Patient Location: Home  Provider Location: Office/Clinic  I discussed the limitations of evaluation and management by telemedicine. The patient expressed understanding and agreed to proceed.  Vital Signs: Because this visit was a virtual/telehealth visit, some criteria may be missing or patient reported. Any vitals not documented were not able to be obtained and vitals that have been documented are patient reported.  VideoDeclined- This patient declined Librarian, academic. Therefore the visit was completed with audio only.  AWV Questionnaire: No: Patient Medicare AWV questionnaire was not completed prior to this visit.  Cardiac Risk Factors include: advanced age (>39men, >12 women);diabetes mellitus;dyslipidemia;male gender     Objective:    Today's Vitals   08/15/23 1305  Weight: 184 lb (83.5 kg)  Height: 5\' 11"  (1.803 m)   Body mass index is 25.66 kg/m.     08/15/2023    1:17 PM 08/12/2022    3:03 PM 06/18/2021    8:55 PM 05/26/2021    2:07 PM 05/22/2020    4:33 PM 01/18/2020    8:55 AM 01/10/2020   12:15 PM  Advanced Directives  Does Patient Have a Medical Advance Directive? No Yes No Yes Yes Yes Yes  Type of Furniture conservator/restorer;Living will  Healthcare Power of [place];Living will  Healthcare Power of [place];Living will Healthcare Power of [place];Living will  Does patient want to make changes to medical advance directive?  No - Patient declined  No - Patient declined No - Patient declined    Copy of Healthcare Power of Attorney in Chart?  No - copy requested  No - copy requested  No - copy requested No - copy requested  Would patient  like information on creating a medical advance directive? No - Patient declined          Current Medications (verified) Outpatient Encounter Medications as of 08/15/2023  Medication Sig   acetaminophen (TYLENOL) 500 MG tablet Take 1-2 tablets (500-1,000 mg total) by mouth in the morning, at noon, and at bedtime.   aspirin EC 81 MG tablet Take 81 mg by mouth daily.   atorvastatin (LIPITOR) 80 MG tablet TAKE 1 TABLET BY MOUTH EVERY DAY   Cholecalciferol (VITAMIN D) 50 MCG (2000 UT) tablet Take 1,000 Units by mouth daily.   DULoxetine (CYMBALTA) 30 MG capsule TAKE 1 CAPSULE BY MOUTH EVERY DAY   empagliflozin (JARDIANCE) 25 MG TABS tablet Take 1 tablet (25 mg total) by mouth daily before breakfast.   ezetimibe (ZETIA) 10 MG tablet TAKE 1 TABLET BY MOUTH EVERY DAY   FREESTYLE LITE test strip CHECK BLOOD SUGAR DAILY AS DIRECTED   glipiZIDE (GLUCOTROL) 5 MG tablet Take 0.5 tablets (2.5 mg total) by mouth daily before breakfast.   glucose monitoring kit (FREESTYLE) monitoring kit 1 each by Does not apply route as needed for other. For use daily to monitor diabetes.  Please include lancets .  Test once daily   E11.9   Lancets (FREESTYLE) lancets Use as instructed   levothyroxine (SYNTHROID) 100 MCG tablet TAKE 1 TABLET BY MOUTH EVERY DAY IN THE MORNING 30 MINUTES BEFORE FOOD   Multiple Vitamin (MULTIVITAMIN) tablet Take 1 tablet by mouth daily.   [DISCONTINUED] benzonatate (  TESSALON) 100 MG capsule Take 1 capsule (100 mg total) by mouth 2 (two) times daily as needed for cough. (Patient not taking: Reported on 08/15/2023)   No facility-administered encounter medications on file as of 08/15/2023.    Allergies (verified) Metformin and related, Simvastatin, and Eszopiclone   History: Past Medical History:  Diagnosis Date   Allergy    resolved after CABG   Calcific Achilles tendonitis    Calculus of ureter    Coronary artery disease    Diabetes mellitus    H/O renal calculi 2012   Hematuria,  unspecified    Hyperlipidemia    Hypertension    Impotence of organic origin    Myocardial infarction [facility])    Peyronie's disease    Pulmonary nodule seen on imaging study 2012   Sleep difficulties    Swelling, mass, or lump in chest    Unspecified hypothyroidism    Past Surgical History:  Procedure Laterality Date   CARDIAC CATHETERIZATION  2001   [facility] x3 stent   COLONOSCOPY     COLONOSCOPY WITH PROPOFOL N/A 01/24/2017   Procedure: COLONOSCOPY WITH PROPOFOL;  Surgeon: Scot Jun, MD;  Location: [facility] ENDOSCOPY;  Service: Endoscopy;  Laterality: N/A;   CORONARY ARTERY BYPASS GRAFT  2006   4 vessel, after 3 or 4 stents    CORONARY ARTERY BYPASS GRAFT     CYSTOSCOPY W/ RETROGRADES  01/18/2020   Procedure: CYSTOSCOPY WITH RETROGRADE PYELOGRAM;  Surgeon: Sondra Come, MD;  Location: [facility] ORS;  Service: Urology;;   CYSTOSCOPY/URETEROSCOPY/HOLMIUM LASER/STENT PLACEMENT Right 01/18/2020   Procedure: CYSTOSCOPY/URETEROSCOPY/HOLMIUM LASER/STENT PLACEMENT;  Surgeon: Sondra Come, MD;  Location: [facility] ORS;  Service: Urology;  Laterality: Right;   OSTECTOMY CALCANEUS Left    PENILE PROSTHESIS IMPLANT     Family History  Problem Relation Age of Onset   Hyperlipidemia Mother    AAA (abdominal aortic aneurysm) Mother    Heart disease Father 23   Social History   Socioeconomic History   Marital status: Married    Spouse name: Coy Saunas now deceased   Number of children: Not on file   Years of education: Not on file   Highest education level: Not on file  Occupational History    Employer: retired  Tobacco Use   Smoking status: Former    Types: Cigars    Quit date: 10/04/1981    Years since quitting: 41.8   Smokeless tobacco: Never  Vaping Use   Vaping status: Never Used  Substance and Sexual Activity   Alcohol use: Yes    Alcohol/week: 1.0 - 2.0 standard drink of alcohol    Types: 1 - 2 Cans of beer per week    Comment: everyday   Drug use: No   Sexual  activity: Not Currently  Other Topics Concern   Not on file  Social History Narrative   Not on file   Social Drivers of Health   Financial Resource Strain: Low Risk  (08/15/2023)   Overall Financial Resource Strain (CARDIA)    Difficulty of Paying Living Expenses: Not hard at all  Food Insecurity: No Food Insecurity (08/15/2023)   Hunger Vital Sign    Worried About Running Out of Food in the Last Year: Never true    Ran Out of Food in the Last Year: Never true  Transportation Needs: No Transportation Needs (08/15/2023)   PRAPARE - Administrator, Civil Service (Medical): No    Lack of Transportation (Non-Medical): No  Physical  Activity: Sufficiently Active (08/15/2023)   Exercise Vital Sign    Days of Exercise per Week: 4 days    Minutes of Exercise per Session: 50 min  Stress: No Stress Concern Present (08/15/2023)   [employer] of Occupational Health - Occupational Stress Questionnaire    Feeling of Stress : Not at all  Social Connections: Moderately Integrated (08/15/2023)   Social Connection and Isolation Panel [NHANES]    Frequency of Communication with Friends and Family: More than three times a week    Frequency of Social Gatherings with Friends and Family: Twice a week    Attends Religious Services: Never    Database administrator or Organizations: Yes    Attends Engineer, structural: More than 4 times per year    Marital Status: Married    Tobacco Counseling Counseling given: Not Answered    Clinical Intake:  Pre-visit preparation completed: Yes  Pain : No/denies pain     BMI - recorded: 25.66 Nutritional Status: BMI 25 -29 Overweight Nutritional Risks: None Diabetes: Yes CBG done?: No Did pt. bring in CBG monitor from home?: No  How often do you need to have someone help you when you read instructions, pamphlets, or other written materials from your doctor or pharmacy?: 1 - Never  Interpreter Needed?: No  Information entered by  :: R. Tytiana Coles LPN   Activities of Daily Living     08/15/2023    1:07 PM  In your present state of health, do you have any difficulty performing the following activities:  Hearing? 0  Vision? 0  Difficulty concentrating or making decisions? 0  Walking or climbing stairs? 0  Dressing or bathing? 0  Doing errands, shopping? 0  Preparing Food and eating ? N  Using the Toilet? N  In the past six months, have you accidently leaked urine? N  Do you have problems with loss of bowel control? N  Managing your Medications? N  Managing your Finances? N  Housekeeping or managing your Housekeeping? N    Patient Care Team: Sherlene Shams, MD as PCP - General (Internal Medicine) Antonieta Iba, MD as Consulting Physician (Cardiology) [facility], [facility])  Indicate any recent Medical Services you may have received from other than [facility] providers in the past year (date may be approximate).     Assessment:   This is a routine wellness examination for Pearson.  Hearing/Vision screen Hearing Screening - Comments:: No issues Vision Screening - Comments:: No glasses   Goals Addressed             This Visit's Progress    Patient Stated       Wants to exercise a little longer       Depression Screen     08/15/2023    1:13 PM 08/03/2023    1:58 PM 05/11/2023   11:37 AM 02/09/2023   10:55 AM 01/11/2023    2:16 PM 12/17/2022   10:34 AM 10/06/2022    2:42 PM  PHQ 2/9 Scores  PHQ - 2 Score 0 0 0 0 0 0 0  PHQ- 9 Score 0 0 0 0 0 0 0    Fall Risk     08/15/2023    1:08 PM 08/03/2023    1:58 PM 05/11/2023   11:37 AM 02/09/2023   10:55 AM 01/11/2023    2:16 PM  Fall Risk   Falls in the past year? 0 0 0 0 0  Number falls in past  yr: 0 0 0 0 0  Injury with Fall? 0 0 0 0 0  Risk for fall due to : No Fall Risks No Fall Risks No Fall Risks No Fall Risks No Fall Risks  Follow up Falls prevention discussed;Falls evaluation completed Falls evaluation completed Falls evaluation completed  Falls evaluation completed Falls evaluation completed    MEDICARE RISK AT HOME:  Medicare Risk at Home Any stairs in or around the home?: No If so, are there any without handrails?: No Home free of loose throw rugs in walkways, pet beds, electrical cords, etc?: Yes Adequate lighting in your home to reduce risk of falls?: Yes Life alert?: No Use of a cane, walker or w/c?: No Grab bars in the bathroom?: No Shower chair or bench in shower?: No Elevated toilet seat or a handicapped toilet?: No  TIMED UP AND GO:  Was the test performed?  No  Cognitive Function: 6CIT completed    05/11/2017    2:32 PM 05/07/2015    9:58 AM  MMSE - Mini Mental State Exam  Orientation to time 5 5  Orientation to Place 5 5  Registration 3 3  Attention/ Calculation 5 5  Recall 3 3  Language- name 2 objects 2 2  Language- repeat 1 1  Language- follow 3 step command 3 3  Language- read & follow direction 1 1  Write a sentence 1 1  Copy design 1 1  Total score 30 30        08/15/2023    1:18 PM 05/26/2021    2:32 PM 05/22/2019    9:17 AM 05/17/2018    9:26 AM 05/11/2016    3:19 PM  6CIT Screen  What Year? 0 points 0 points 0 points 0 points 0 points  What month? 0 points 0 points 0 points 0 points 0 points  What time? 0 points 0 points 0 points 0 points 0 points  Count back from 20 0 points 0 points 0 points 0 points 0 points  Months in reverse 0 points 0 points 0 points 0 points 0 points  Repeat phrase 0 points 0 points 0 points  0 points  Total Score 0 points 0 points 0 points  0 points    Immunizations Immunization History  Administered Date(s) Administered   Fluad Quad(high Dose 65+) 03/21/2020, 02/03/2022   Influenza Split 03/16/2013, 02/11/2015   Influenza Whole 02/14/2012   Influenza-Unspecified 02/05/2013, 02/11/2015, 02/27/2016, 02/20/2018, 02/20/2019, 02/05/2021, 02/19/2022, 03/22/2023   PFIZER(Purple Top)SARS-COV-2 Vaccination 06/12/2019, 07/03/2019, 01/20/2020, 09/30/2020    Pfizer Covid-19 Vaccine Bivalent Booster 46yrs & up 02/05/2021   Pneumococcal Conjugate-13 03/16/2013   Pneumococcal Polysaccharide-23 07/08/2009, 02/02/2013, 06/25/2015   Tdap 02/28/2012, 02/02/2013, 03/16/2013   Zoster Recombinant(Shingrix) 04/06/2018, 08/29/2018    Screening Tests Health Maintenance  Topic Date Due   Colonoscopy  01/24/2022   DTaP/Tdap/Td (4 - Td or Tdap) 03/17/2023   OPHTHALMOLOGY EXAM  03/25/2023   COVID-19 Vaccine (8 - 2024-25 season) 08/19/2023 (Originally 02/06/2023)   HEMOGLOBIN A1C  11/09/2023   Diabetic kidney evaluation - Urine ACR  01/11/2024   Diabetic kidney evaluation - eGFR measurement  01/19/2024   FOOT EXAM  05/10/2024   Medicare Annual Wellness (AWV)  08/14/2024   Pneumonia Vaccine 23+ Years old  Completed   INFLUENZA VACCINE  Completed   Zoster Vaccines- Shingrix  Completed   HPV VACCINES  Aged Out    Health Maintenance  Health Maintenance Due  Topic Date Due   Colonoscopy  01/24/2022   DTaP/Tdap/Td (  4 - Td or Tdap) 03/17/2023   OPHTHALMOLOGY EXAM  03/25/2023   Health Maintenance Items Addressed: Discussed the need for a tetanus shot. Patient's last colonoscopy was  01/2017 and was to repeat in 5 years. Patient will discuss with PCP because of his age.  Additional Screening:  Vision Screening: Recommended annual ophthalmology exams for early detection of glaucoma and other disorders of the eye. Patient stated that he is up to date. [facility] Will request last office visit notes  Dental Screening: Recommended annual dental exams for proper oral hygiene  Community Resource Referral / Chronic Care Management: CRR required this visit?  No   CCM required this visit?  No     Plan:     I have personally reviewed and noted the following in the patient's chart:   Medical and social history Use of alcohol, tobacco or illicit drugs  Current medications and supplements including opioid prescriptions. Patient is not currently taking  opioid prescriptions. Functional ability and status Nutritional status Physical activity Advanced directives List of other physicians Hospitalizations, surgeries, and ER visits in previous 12 months Vitals Screenings to include cognitive, depression, and falls Referrals and appointments  In addition, I have reviewed and discussed with patient certain preventive protocols, quality metrics, and best practice recommendations. A written personalized care plan for preventive services as well as general preventive health recommendations were provided to patient.     Sydell Axon, LPN   1/61/0960   After Visit Summary: (MyChart) Due to this being a telephonic visit, the after visit summary with patients personalized plan was offered to patient via MyChart   Notes: Please refer to Routing Comments.

## 2023-08-15 NOTE — Patient Instructions (Signed)
 [name] , Thank you for taking time to come for your Medicare Wellness Visit. I appreciate your ongoing commitment to your health goals. Please review the following plan we discussed and let me know if I can assist you in the future.   Referrals/Orders/Follow-Ups/Clinician Recommendations: Remember to update your Tetanus vaccine.  This is a list of the screening recommended for you and due dates:  Health Maintenance  Topic Date Due   Colon Cancer Screening  01/24/2022   DTaP/Tdap/Td vaccine (4 - Td or Tdap) 03/17/2023   Eye exam for diabetics  03/25/2023   COVID-19 Vaccine (8 - 2024-25 season) 08/19/2023*   Hemoglobin A1C  11/09/2023   Yearly kidney health urinalysis for diabetes  01/11/2024   Yearly kidney function blood test for diabetes  01/19/2024   Complete foot exam   05/10/2024   Medicare Annual Wellness Visit  08/14/2024   Pneumonia Vaccine  Completed   Flu Shot  Completed   Zoster (Shingles) Vaccine  Completed   HPV Vaccine  Aged Out  *Topic was postponed. The date shown is not the original due date.    Advanced directives: (Declined) Advance directive discussed with you today. Even though you declined this today, please call our office should you change your mind, and we can give you the proper paperwork for you to fill out.   Next Medicare Annual Wellness Visit scheduled for next year: Yes 08/20/24 @ 1:00

## 2023-09-02 ENCOUNTER — Other Ambulatory Visit: Payer: Self-pay | Admitting: Internal Medicine

## 2023-11-09 ENCOUNTER — Encounter: Payer: Self-pay | Admitting: Student

## 2023-11-09 ENCOUNTER — Ambulatory Visit: Attending: Student | Admitting: Student

## 2023-11-09 VITALS — BP 110/50 | HR 68 | Ht 71.0 in | Wt 181.6 lb

## 2023-11-09 DIAGNOSIS — I25810 Atherosclerosis of coronary artery bypass graft(s) without angina pectoris: Secondary | ICD-10-CM | POA: Diagnosis not present

## 2023-11-09 DIAGNOSIS — Z87898 Personal history of other specified conditions: Secondary | ICD-10-CM | POA: Diagnosis not present

## 2023-11-09 DIAGNOSIS — E785 Hyperlipidemia, unspecified: Secondary | ICD-10-CM | POA: Diagnosis not present

## 2023-11-09 DIAGNOSIS — I951 Orthostatic hypotension: Secondary | ICD-10-CM

## 2023-11-09 NOTE — Progress Notes (Signed)
 Dr. Gollan  Cardiology Clinic Note   Date: 11/09/2023 ID: Brett, [name] 08-29-1940, MRN [id number]  Primary Cardiologist:  Belva Boyden, MD  Chief Complaint   Brett [name] is a 83 y.o. male who presents to the clinic today for routine follow up.   Patient Profile   Brett [name] is followed by Dr. Gollan for the history outlined below.      Past medical history significant for: CAD. Previous stents. CABG x 4 2006. Orthostatic hypotension. Hyperlipidemia. Lipid panel 01/11/2023: Direct LDL 59, HDL 53, TG 186, total 125. Hypothyroidism. T2DM. Syncope. Echo 06/19/2021: EF 60 to 65%.  No RWMA.  Grade I DD.  Normal RV size/function.  Mild LAE.  Mild MR.  Aortic valve sclerosis/calcification without stenosis. 14-day ZIO 07/20/2021: Average HR 56 bpm.  Predominantly sinus rhythm.  3 runs of SVT fastest/longest 5 beats max rate 152 bpm, average 117 bpm.  Patient triggered events not associated with significant arrhythmia.  In summary, patient was previously followed by [facility] in [facility] until he moved to [place] in 2015.  He initially established with Dr. Alroy Aspen.  He transitioned his care to Dr. Gollan for AS of location to [facility] in November 2017.  In January 2023 patient had a syncopal event at home.  He was transported to the ED.  He was orthostatic with EMTs and in the emergency room with a 30 to 40 mmHg drop with standing.  Patient's wife reported he drinks soda and not much else.  He took Micardis  and Flomax  at 4 PM and syncopized at 7 PM.  He was evaluated by cardiology and noted heart rate in the low 40s to low 50s.  Echo demonstrated normal LV/RV function.  He was instructed to hold metoprolol , Micardis , Flomax  and increase fluid intake.  14-day ZIO did not show any significant arrhythmias.  Medications were not restarted on follow-up in February 2023.  Patient was last seen in the office by Dr. Gollan on 08/09/2022 for routine follow-up.  His BP was low but he was  asymptomatic.  He endorsed poor fluid intake.     History of Present Illness    Today, patient is doing well. Patient denies shortness of breath, dyspnea on exertion, lower extremity edema, orthopnea or PND. No chest pain, pressure, or tightness. No palpitations.  He is very active walking for 45 minutes 3-4 times a week. 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 He is hydrating well. No further episodes of syncope.     ROS: All other systems reviewed and are otherwise negative except as noted in History of Present Illness.  EKGs/Labs Reviewed    EKG Interpretation Date/Time:  Wednesday November 09 2023 14:48:24 EDT Ventricular Rate:  71 PR Interval:  158 QRS Duration:  84 QT Interval:  408 QTC Calculation: 443 R Axis:   0  Text Interpretation: Normal sinus rhythm Nonspecific T wave abnormalities When compared with ECG of 08/09/2022 No significant change was found Confirmed by Morey Ar [id number]) on 11/09/2023 3:01:29 PM   01/11/2023: ALT 32; AST 27 01/19/2023: BUN 22; Creatinine, Ser 1.12; Potassium 4.0; Sodium 136   01/11/2023: Hemoglobin 14.4; WBC 6.6   01/11/2023: TSH 1.57    Physical Exam    VS:  BP (!) 110/50   Pulse 68   Ht 5\' 11"  (1.803 m)   Wt 181 lb 9.6 oz (82.4 kg)   SpO2 97%   BMI 25.33 kg/m  , BMI Body mass index is 25.33  kg/m.  GEN: Well nourished, well developed, in no acute distress. Neck: No JVD or carotid bruits. Cardiac:  RRR. No murmurs. No rubs or gallops.   Respiratory:  Respirations regular and unlabored. Clear to auscultation without rales, wheezing or rhonchi. GI: Soft, nontender, nondistended. Extremities: Radials/DP/PT 2+ and equal bilaterally. No clubbing or cyanosis. No edema.  Skin: Warm and dry, no rash. Neuro: Strength intact.  Assessment & Plan   CAD S/p CABG x 4 2006.  Patient denies chest pain, pressure or tightness. 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 EKG without acute changes.  - Continue atorvastatin , Zetia .  Orthostatic hypotension/syncope Echo January 2023 showed normal LV/RV function, Grade I DD, mild MR.  14-day ZIO showed average heart rate 56 bpm, 3 runs of SVT, no other arrhythmia.  Patient denies further episodes of syncope. He is hydrating well. BP today 110/50. - Continue to hydrate with water.   Hyperlipidemia LDL 59 August 2024, at goal. - Continue atorvastatin  and Zetia .  Disposition: Return in 1 year or sooner as needed.          Signed, Lonell Rives. Jeven Topper, DNP, NP-C

## 2023-11-09 NOTE — Patient Instructions (Signed)
 Medication Instructions:  Your physician recommends that you continue on your current medications as directed. Please refer to the Current Medication list given to you today.   *If you need a refill on your cardiac medications before your next appointment, please call your pharmacy*  Lab Work: None ordered at this time  If you have labs (blood work) drawn today and your tests are completely normal, you will receive your results only by: MyChart Message (if you have MyChart) OR A paper copy in the mail If you have any lab test that is abnormal or we need to change your treatment, we will call you to review the results.  Testing/Procedures: None ordered at this time   Follow-Up: At [facility], you and your health needs are our priority.  As part of our continuing mission to provide you with exceptional heart care, our providers are all part of one team.  This team includes your primary Cardiologist (physician) and Advanced Practice Providers or APPs (Physician Assistants and Nurse Practitioners) who all work together to provide you with the care you need, when you need it.  Your next appointment:   1 year(s)  Provider:   You may see Timothy Gollan, MD or one of the following Advanced Practice Providers on your designated Care Team:   Laneta Pintos, NP Gildardo Labrador, PA-C Varney Gentleman, PA-C Cadence Dexter, PA-C Ronald Cockayne, NP Morey Ar, NP    We recommend signing up for the patient portal called "MyChart".  Sign up information is provided on this After Visit Summary.  MyChart is used to connect with patients for Virtual Visits (Telemedicine).  Patients are able to view lab/test results, encounter notes, upcoming appointments, etc.  Non-urgent messages can be sent to your provider as well.   To learn more about what you can do with MyChart, go to [email or web address].

## 2024-01-14 ENCOUNTER — Other Ambulatory Visit: Payer: Self-pay | Admitting: Internal Medicine

## 2024-02-17 ENCOUNTER — Other Ambulatory Visit: Payer: Self-pay | Admitting: Internal Medicine

## 2024-02-17 ENCOUNTER — Other Ambulatory Visit: Payer: Self-pay | Admitting: Cardiovascular Disease

## 2024-02-17 DIAGNOSIS — E039 Hypothyroidism, unspecified: Secondary | ICD-10-CM

## 2024-03-03 ENCOUNTER — Other Ambulatory Visit: Payer: Self-pay | Admitting: Cardiovascular Disease

## 2024-03-13 NOTE — Progress Notes (Signed)
 Brett [name]                                          MRN: [id number]   03/13/2024   The VBCI Quality Team Specialist reviewed this patient medical record for the purposes of chart review for care gap closure. The following were reviewed: chart review for care gap closure-kidney health evaluation for diabetes:eGFR  and uACR.    VBCI Quality Team

## 2024-04-13 ENCOUNTER — Encounter: Payer: Self-pay | Admitting: Pharmacist

## 2024-04-13 NOTE — Progress Notes (Signed)
 Pharmacy Quality Measure Review  This patient is appearing on a report for being at risk of failing the Kidney Health Evaluation for Patients with Diabetes measure this calendar year.   Last documented UACR = 2023 Lab Results  Component Value Date   MICRALBCREAT 9 06/29/2021   Last Documented A1c = 2024 Lab Results  Component Value Date   HGBA1C 6.9 (A) 05/11/2023   Last PCP visit Dec 2024 with follow up plan for 55-months.  Patient overdue for PCP visit.  Routed to scheduling pool.

## 2024-06-08 ENCOUNTER — Other Ambulatory Visit: Payer: Self-pay | Admitting: Internal Medicine

## 2024-06-24 ENCOUNTER — Other Ambulatory Visit: Payer: Self-pay | Admitting: Internal Medicine

## 2024-06-24 DIAGNOSIS — J069 Acute upper respiratory infection, unspecified: Secondary | ICD-10-CM

## 2024-06-30 ENCOUNTER — Other Ambulatory Visit: Payer: Self-pay | Admitting: Internal Medicine

## 2024-07-12 ENCOUNTER — Ambulatory Visit: Admitting: Internal Medicine

## 2024-07-12 ENCOUNTER — Ambulatory Visit: Payer: Self-pay | Admitting: Internal Medicine

## 2024-07-12 ENCOUNTER — Encounter: Payer: Self-pay | Admitting: Internal Medicine

## 2024-07-12 VITALS — BP 110/72 | HR 71 | Temp 97.8°F | Ht 71.0 in | Wt 185.2 lb

## 2024-07-12 DIAGNOSIS — G3184 Mild cognitive impairment, so stated: Secondary | ICD-10-CM

## 2024-07-12 DIAGNOSIS — R4189 Other symptoms and signs involving cognitive functions and awareness: Secondary | ICD-10-CM

## 2024-07-12 DIAGNOSIS — E11311 Type 2 diabetes mellitus with unspecified diabetic retinopathy with macular edema: Secondary | ICD-10-CM

## 2024-07-12 DIAGNOSIS — E1159 Type 2 diabetes mellitus with other circulatory complications: Secondary | ICD-10-CM

## 2024-07-12 DIAGNOSIS — E119 Type 2 diabetes mellitus without complications: Secondary | ICD-10-CM

## 2024-07-12 DIAGNOSIS — D649 Anemia, unspecified: Secondary | ICD-10-CM

## 2024-07-12 DIAGNOSIS — E785 Hyperlipidemia, unspecified: Secondary | ICD-10-CM

## 2024-07-12 LAB — COMPREHENSIVE METABOLIC PANEL WITH GFR
ALT: 18 U/L (ref 3–53)
AST: 20 U/L (ref 5–37)
Albumin: 4.6 g/dL (ref 3.5–5.2)
Alkaline Phosphatase: 65 U/L (ref 39–117)
BUN: 17 mg/dL (ref 6–23)
CO2: 33 meq/L — ABNORMAL HIGH (ref 19–32)
Calcium: 9.7 mg/dL (ref 8.4–10.5)
Chloride: 102 meq/L (ref 96–112)
Creatinine, Ser: 1.01 mg/dL (ref 0.40–1.50)
GFR: 69.85 mL/min
Glucose, Bld: 198 mg/dL — ABNORMAL HIGH (ref 70–99)
Potassium: 3.9 meq/L (ref 3.5–5.1)
Sodium: 140 meq/L (ref 135–145)
Total Bilirubin: 0.8 mg/dL (ref 0.2–1.2)
Total Protein: 7.4 g/dL (ref 6.0–8.3)

## 2024-07-12 LAB — MICROALBUMIN / CREATININE URINE RATIO
Creatinine,U: 76.7 mg/dL
Microalb Creat Ratio: 12.9 mg/g (ref 0.0–30.0)
Microalb, Ur: 1 mg/dL (ref 0.7–1.9)

## 2024-07-12 LAB — LIPID PANEL
Cholesterol: 128 mg/dL (ref 28–200)
HDL: 57.4 mg/dL
LDL Cholesterol: 47 mg/dL (ref 10–99)
NonHDL: 70.29
Total CHOL/HDL Ratio: 2
Triglycerides: 116 mg/dL (ref 10.0–149.0)
VLDL: 23.2 mg/dL (ref 0.0–40.0)

## 2024-07-12 LAB — CBC WITH DIFFERENTIAL/PLATELET
Basophils Absolute: 0 10*3/uL (ref 0.0–0.1)
Basophils Relative: 0.6 % (ref 0.0–3.0)
Eosinophils Absolute: 0.2 10*3/uL (ref 0.0–0.7)
Eosinophils Relative: 3.5 % (ref 0.0–5.0)
HCT: 45.5 % (ref 39.0–52.0)
Hemoglobin: 15.2 g/dL (ref 13.0–17.0)
Lymphocytes Relative: 26.2 % (ref 12.0–46.0)
Lymphs Abs: 1.6 10*3/uL (ref 0.7–4.0)
MCHC: 33.5 g/dL (ref 30.0–36.0)
MCV: 93.1 fl (ref 78.0–100.0)
Monocytes Absolute: 0.6 10*3/uL (ref 0.1–1.0)
Monocytes Relative: 9.8 % (ref 3.0–12.0)
Neutro Abs: 3.8 10*3/uL (ref 1.4–7.7)
Neutrophils Relative %: 59.9 % (ref 43.0–77.0)
Platelets: 212 10*3/uL (ref 150.0–400.0)
RBC: 4.88 Mil/uL (ref 4.22–5.81)
RDW: 13.5 % (ref 11.5–15.5)
WBC: 6.3 10*3/uL (ref 4.0–10.5)

## 2024-07-12 LAB — POCT GLYCOSYLATED HEMOGLOBIN (HGB A1C): Hemoglobin A1C: 7.5 % — AB (ref 4.0–5.6)

## 2024-07-12 LAB — B12 AND FOLATE PANEL
Folate: >23.4 ng/mL
Vitamin B-12: 627 pg/mL (ref 211–911)

## 2024-07-12 LAB — LDL CHOLESTEROL, DIRECT: Direct LDL: 57 mg/dL

## 2024-07-12 LAB — TSH: TSH: 8.85 u[IU]/mL — ABNORMAL HIGH (ref 0.35–5.50)

## 2024-07-12 MED ORDER — GLIPIZIDE 5 MG PO TABS: 2.5000 mg | ORAL_TABLET | Freq: Two times a day (BID) | ORAL | 1 refills | Status: AC

## 2024-07-12 NOTE — Assessment & Plan Note (Signed)
 Managed with Zetia  and lipitor without muscle pain   Lab Results  Component Value Date   CHOL 128 07/12/2024   HDL 57.40 07/12/2024   LDLCALC 47 07/12/2024   LDLDIRECT 57.0 07/12/2024   TRIG 116.0 07/12/2024   CHOLHDL 2 07/12/2024

## 2024-07-12 NOTE — Assessment & Plan Note (Signed)
 Progressing  per wife.  MRI brain,  neurology evaluation recommended and accepted

## 2024-07-12 NOTE — Progress Notes (Signed)
 "  Subjective:  Patient ID: Brett [name], male    DOB: 1940-12-31  Age: 84 y.o. MRN: [id number]  CC: The primary encounter diagnosis was Cognitive changes. 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   HPI Carter Cafaro presents for  Chief Complaint  Patient presents with   Medical Management of Chronic Issues    Follow up   1) Type 2 DM:  has been lost to follow up , last OV and last A1c Dec 2024. Taking jardiance  25 mg daily .and glipizide  2.5 mg once daily in the AM  last A1c wsa 6.9  today's  is 7.5  he is unable to provide much any history of blood sugars  ,but his wife reports that he is drinking 3 or 4 bourbon  drinks with diet ginger ale per night .  He walks  reluctantly 3 times per week with her for exercise.   2)  Mild cognitive impairment :  per his wife,  his memory impairment has progressed.  He has trouble remembering driving  directions.  Convserational skills have started to decline.    Outpatient Medications Prior to Visit  Medication Sig Dispense Refill   acetaminophen  (TYLENOL ) 500 MG tablet Take 1-2 tablets (500-1,000 mg total) by mouth in the morning, at noon, and at bedtime.     atorvastatin  (LIPITOR) 80 MG tablet TAKE 1 TABLET BY MOUTH EVERY DAY 90 tablet 3   Cholecalciferol  (VITAMIN D ) 50 MCG (2000 UT) tablet Take 1,000 Units by mouth daily.     DULoxetine  (CYMBALTA ) 30 MG capsule TAKE 1 CAPSULE BY MOUTH EVERY DAY 90 capsule 0   empagliflozin  (JARDIANCE ) 25 MG TABS tablet Take 1 tablet (25 mg total) by mouth daily before breakfast. 90 tablet 1   ezetimibe  (ZETIA ) 10 MG tablet TAKE 1 TABLET  BY MOUTH EVERY DAY 90 tablet 2   FREESTYLE LITE test strip CHECK BLOOD SUGAR DAILY AS DIRECTED 100 strip 5   glucose monitoring kit (FREESTYLE) monitoring kit 1 each by Does not apply route as needed for other. For use daily to monitor diabetes.  Please include lancets .  Test once daily   E11.9 1 each 3   Lancets (FREESTYLE) lancets Use as instructed 100 each 12   levothyroxine  (SYNTHROID ) 100 MCG tablet TAKE 1 TABLET BY MOUTH EVERY DAY IN THE MORNING 30 MINUTES BEFORE FOOD 90 tablet 1   Multiple Vitamin (MULTIVITAMIN) tablet Take 1 tablet by mouth daily.     glipiZIDE  (GLUCOTROL ) 5 MG tablet TAKE 1/2 TABLET BY MOUTH DAILY BEFORE BREAKFAST 5 tablet 0   No facility-administered medications prior to visit.    Review of Systems;  Patient denies headache, fevers, malaise, unintentional weight loss, skin rash, eye pain, sinus congestion and sinus pain, sore throat, dysphagia,  hemoptysis , cough, dyspnea, wheezing, chest pain, palpitations, orthopnea, edema, abdominal pain, nausea, melena, diarrhea, constipation, flank pain, dysuria, hematuria, urinary  Frequency, nocturia, numbness, tingling, seizures,  Focal weakness, Loss of consciousness,  Tremor, insomnia, depression, anxiety, and suicidal ideation.      Objective:  BP 110/72 (BP Location: Left Arm)   Pulse 71   Temp 97.8 F (36.6 C)   Ht 5' 11 (1.803 m)  Wt 185 lb 3.2 oz (84 kg)   SpO2 96%   BMI 25.83 kg/m   BP Readings from Last 3 Encounters:  07/12/24 110/72  11/09/23 (!) 110/50  08/03/23 120/68    Wt Readings from Last 3 Encounters:  07/12/24 185 lb 3.2 oz (84 kg)  11/09/23 181 lb 9.6 oz (82.4 kg)  08/15/23 184 lb (83.5 kg)    Physical Exam Vitals reviewed.  Constitutional:      General: He is not in acute distress.    Appearance: Normal appearance. He is normal weight. He is not ill-appearing, toxic-appearing or diaphoretic.  HENT:     Head: Normocephalic.  Eyes:     General: No scleral icterus.       Right  eye: No discharge.        Left eye: No discharge.     Conjunctiva/sclera: Conjunctivae normal.  Cardiovascular:     Rate and Rhythm: Normal rate and regular rhythm.     Heart sounds: Normal heart sounds.  Pulmonary:     Effort: Pulmonary effort is normal. No respiratory distress.     Breath sounds: Normal breath sounds.  Musculoskeletal:        General: Normal range of motion.     Cervical back: Normal range of motion.  Skin:    General: Skin is warm and dry.  Neurological:     General: No focal deficit present.     Mental Status: He is alert and oriented to person, place, and time. Mental status is at baseline.  Psychiatric:        Mood and Affect: Mood normal.        Behavior: Behavior normal.        Thought Content: Thought content normal.        Judgment: Judgment normal.     Lab Results  Component Value Date   HGBA1C 7.5 (A) 07/12/2024   HGBA1C 6.9 (A) 05/11/2023   HGBA1C 7.9 (H) 01/19/2023    Lab Results  Component Value Date   CREATININE 1.01 07/12/2024   CREATININE 1.12 01/19/2023   CREATININE 1.19 01/11/2023    Lab Results  Component Value Date   WBC 6.3 07/12/2024   HGB 15.2 07/12/2024   HCT 45.5 07/12/2024   PLT 212.0 07/12/2024   GLUCOSE 198 (H) 07/12/2024   CHOL 128 07/12/2024   TRIG 116.0 07/12/2024   HDL 57.40 07/12/2024   LDLDIRECT 57.0 07/12/2024   LDLCALC 47 07/12/2024   ALT 18 07/12/2024   AST 20 07/12/2024   NA 140 07/12/2024   K 3.9 07/12/2024   CL 102 07/12/2024   CREATININE 1.01 07/12/2024   BUN 17 07/12/2024   CO2 33 (H) 07/12/2024   TSH 8.85 (H) 07/12/2024   PSA 0.42 04/03/2018   HGBA1C 7.5 (A) 07/12/2024   MICROALBUR 1.0 07/12/2024    DG Chest 2 View Result Date: 12/23/2022 CLINICAL DATA:  Cough and congestion for 3 weeks. EXAM: CHEST - 2 VIEW COMPARISON:  06/18/2021 FINDINGS: Prior median sternotomy. Heart size is normal. RIGHT lung is clear. There is chronic scarring at the LEFT lung base, showing long-term stability.  Moderate midthoracic spondylosis. IMPRESSION: Chronic scarring at the LEFT lung base. Electronically Signed   By: Almarie Daring M.D.   On: 12/23/2022 16:47    Assessment & Plan:   Problem List Items Addressed This Visit     Diabetes mellitus treated with oral medication (HCC)   Loss of control noted.    adding glipizide  2.5 mg at  dinner       Relevant Medications   glipiZIDE  (GLUCOTROL ) 5 MG tablet   Diabetes mellitus with retinopathy (HCC)   Relevant Medications   glipiZIDE  (GLUCOTROL ) 5 MG tablet   Other Relevant Orders   Comprehensive metabolic panel (Completed)   POCT HgB A1C (Completed)   Hyperlipidemia LDL goal <70   Managed with Zetia  and lipitor without muscle pain   Lab Results  Component Value Date   CHOL 128 07/12/2024   HDL 57.40 07/12/2024   LDLCALC 47 07/12/2024   LDLDIRECT 57.0 07/12/2024   TRIG 116.0 07/12/2024   CHOLHDL 2 07/12/2024         Relevant Orders   Lipid panel (Completed)   Direct LDL (Completed)   Mild cognitive impairment with memory loss   Progressing  per wife.  MRI brain,  neurology evaluation recommended and accepted       Other Visit Diagnoses       Cognitive changes    -  Primary     Anemia, unspecified type       Relevant Orders   CBC with Differential/Platelet (Completed)     Cognitive decline       Relevant Orders   TSH (Completed)   B12 and Folate Panel (Completed)   MR Brain Wo Contrast   Ambulatory referral to Neurology     Hypertension associated with diabetes (HCC)       Relevant Medications   glipiZIDE  (GLUCOTROL ) 5 MG tablet   Other Relevant Orders   Microalbumin / creatinine urine ratio (Completed)         Follow-up: Return in about 3 months (around 10/11/2024) for follow up diabetes.   Verneita LITTIE Kettering, MD "

## 2024-07-12 NOTE — Assessment & Plan Note (Signed)
 Loss of control noted.    adding glipizide  2.5 mg at dinner

## 2024-07-12 NOTE — Patient Instructions (Addendum)
 Your A1c I  has gone up and is now 7.5.    THIS IS TOO HIGH, so  I am adding 2.5 mg of glipizide  in  the evening, before your dinner meal.   Continue 2.5 mg glipizide  before breakfast  and Jardiance  once daily   Increase your walking to 5 times per week; just 30 minutes  will help the diabetes get under control   I am concerned about your memory loss.  I have ordered an MRI of your brain along with a referral to Dr Maree at [facility].  He is a neurologist who helps people with memory loss

## 2024-08-20 ENCOUNTER — Ambulatory Visit
